# Patient Record
Sex: Male | Born: 1955 | Race: White | Hispanic: No | Marital: Married | Smoking: Never smoker
Health system: Southern US, Community
[De-identification: ages and names within clinical notes are randomized; demographics above are authoritative.]

## PROBLEM LIST (undated history)

## (undated) ENCOUNTER — Ambulatory Visit: Admission: EM | Payer: Commercial Managed Care - PPO | Source: Home / Self Care

## (undated) DIAGNOSIS — I10 Essential (primary) hypertension: Secondary | ICD-10-CM

## (undated) DIAGNOSIS — R931 Abnormal findings on diagnostic imaging of heart and coronary circulation: Secondary | ICD-10-CM

## (undated) DIAGNOSIS — Z860100 Personal history of colon polyps, unspecified: Secondary | ICD-10-CM

## (undated) DIAGNOSIS — T8859XA Other complications of anesthesia, initial encounter: Secondary | ICD-10-CM

## (undated) DIAGNOSIS — R7303 Prediabetes: Secondary | ICD-10-CM

## (undated) DIAGNOSIS — Z9889 Other specified postprocedural states: Secondary | ICD-10-CM

## (undated) DIAGNOSIS — R112 Nausea with vomiting, unspecified: Secondary | ICD-10-CM

## (undated) DIAGNOSIS — G4733 Obstructive sleep apnea (adult) (pediatric): Secondary | ICD-10-CM

## (undated) DIAGNOSIS — C801 Malignant (primary) neoplasm, unspecified: Secondary | ICD-10-CM

## (undated) DIAGNOSIS — R7301 Impaired fasting glucose: Secondary | ICD-10-CM

## (undated) DIAGNOSIS — E785 Hyperlipidemia, unspecified: Secondary | ICD-10-CM

## (undated) DIAGNOSIS — Z8719 Personal history of other diseases of the digestive system: Secondary | ICD-10-CM

## (undated) DIAGNOSIS — C61 Malignant neoplasm of prostate: Secondary | ICD-10-CM

## (undated) HISTORY — DX: Malignant neoplasm of prostate: C61

## (undated) HISTORY — DX: Essential (primary) hypertension: I10

## (undated) HISTORY — DX: Abnormal findings on diagnostic imaging of heart and coronary circulation: R93.1

## (undated) HISTORY — DX: Personal history of colon polyps, unspecified: Z86.0100

## (undated) HISTORY — DX: Obstructive sleep apnea (adult) (pediatric): G47.33

## (undated) HISTORY — DX: Personal history of other diseases of the digestive system: Z87.19

## (undated) HISTORY — PX: NASAL SEPTUM SURGERY: SHX37

## (undated) HISTORY — DX: Personal history of colon polyps: Z86.010

## (undated) HISTORY — PX: EYE SURGERY: SHX253

## (undated) HISTORY — DX: Impaired fasting glucose: R73.01

## (undated) HISTORY — PX: TONSILLECTOMY: SUR1361

## (undated) HISTORY — PX: OTHER SURGICAL HISTORY: SHX169

---

## 2002-01-11 ENCOUNTER — Ambulatory Visit (HOSPITAL_COMMUNITY): Admission: RE | Admit: 2002-01-11 | Discharge: 2002-01-11 | Payer: Self-pay | Admitting: Internal Medicine

## 2002-11-03 ENCOUNTER — Encounter: Payer: Self-pay | Admitting: Family Medicine

## 2002-11-03 ENCOUNTER — Ambulatory Visit (HOSPITAL_COMMUNITY): Admission: RE | Admit: 2002-11-03 | Discharge: 2002-11-03 | Payer: Self-pay | Admitting: Family Medicine

## 2006-10-05 ENCOUNTER — Ambulatory Visit (HOSPITAL_BASED_OUTPATIENT_CLINIC_OR_DEPARTMENT_OTHER): Admission: RE | Admit: 2006-10-05 | Discharge: 2006-10-05 | Payer: Self-pay | Admitting: Internal Medicine

## 2006-10-05 ENCOUNTER — Encounter: Payer: Self-pay | Admitting: Pulmonary Disease

## 2006-10-08 ENCOUNTER — Ambulatory Visit: Payer: Self-pay | Admitting: Internal Medicine

## 2007-03-27 ENCOUNTER — Telehealth: Payer: Self-pay | Admitting: Internal Medicine

## 2007-11-27 ENCOUNTER — Emergency Department (HOSPITAL_COMMUNITY): Admission: EM | Admit: 2007-11-27 | Discharge: 2007-11-27 | Payer: Self-pay | Admitting: Emergency Medicine

## 2008-09-09 DIAGNOSIS — I10 Essential (primary) hypertension: Secondary | ICD-10-CM | POA: Insufficient documentation

## 2008-09-09 DIAGNOSIS — E039 Hypothyroidism, unspecified: Secondary | ICD-10-CM | POA: Insufficient documentation

## 2008-09-09 DIAGNOSIS — E785 Hyperlipidemia, unspecified: Secondary | ICD-10-CM | POA: Insufficient documentation

## 2008-09-09 DIAGNOSIS — G4733 Obstructive sleep apnea (adult) (pediatric): Secondary | ICD-10-CM | POA: Insufficient documentation

## 2008-09-10 ENCOUNTER — Ambulatory Visit: Payer: Self-pay | Admitting: Pulmonary Disease

## 2008-09-24 ENCOUNTER — Encounter: Payer: Self-pay | Admitting: Pulmonary Disease

## 2008-09-25 DIAGNOSIS — G4700 Insomnia, unspecified: Secondary | ICD-10-CM | POA: Insufficient documentation

## 2009-01-21 ENCOUNTER — Ambulatory Visit (HOSPITAL_COMMUNITY): Admission: RE | Admit: 2009-01-21 | Discharge: 2009-01-21 | Payer: Self-pay | Admitting: Internal Medicine

## 2009-02-04 ENCOUNTER — Ambulatory Visit (HOSPITAL_COMMUNITY): Admission: RE | Admit: 2009-02-04 | Discharge: 2009-02-04 | Payer: Self-pay | Admitting: Otolaryngology

## 2009-04-13 ENCOUNTER — Encounter: Payer: Self-pay | Admitting: Pulmonary Disease

## 2009-10-01 ENCOUNTER — Ambulatory Visit: Payer: Self-pay | Admitting: Otolaryngology

## 2010-01-29 ENCOUNTER — Ambulatory Visit (HOSPITAL_BASED_OUTPATIENT_CLINIC_OR_DEPARTMENT_OTHER): Admission: RE | Admit: 2010-01-29 | Discharge: 2010-01-29 | Payer: Self-pay | Admitting: Ophthalmology

## 2010-02-17 ENCOUNTER — Ambulatory Visit: Payer: Self-pay | Admitting: Internal Medicine

## 2010-02-23 ENCOUNTER — Ambulatory Visit: Payer: Self-pay | Admitting: Internal Medicine

## 2010-03-09 ENCOUNTER — Telehealth (INDEPENDENT_AMBULATORY_CARE_PROVIDER_SITE_OTHER): Payer: Self-pay | Admitting: *Deleted

## 2010-03-16 ENCOUNTER — Encounter (INDEPENDENT_AMBULATORY_CARE_PROVIDER_SITE_OTHER): Payer: Self-pay | Admitting: *Deleted

## 2010-03-16 DIAGNOSIS — R1319 Other dysphagia: Secondary | ICD-10-CM

## 2010-04-08 ENCOUNTER — Ambulatory Visit (HOSPITAL_COMMUNITY)
Admission: RE | Admit: 2010-04-08 | Discharge: 2010-04-08 | Payer: Self-pay | Source: Home / Self Care | Attending: Gastroenterology | Admitting: Gastroenterology

## 2010-04-08 ENCOUNTER — Encounter: Payer: Self-pay | Admitting: Gastroenterology

## 2010-05-18 NOTE — Progress Notes (Signed)
Summary: Endo Colon  Phone Note Outgoing Call Call back at [phone]   Call placed by: Chales Abrahams CMA Duncan Dull),  March 09, 2010 1:10 PM Summary of Call: Dr Christella Hartigan called and asked me to schedule an Endo Colon on the pt possible ballon dil.  on  03/25/10 or 04/08/10.  Thurs at [employer].  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 Follow-up by: Chales Abrahams CMA Duncan Dull),  March 10, 2010 12:05 PM  Additional Follow-up for Phone Call Additional follow up Details #1::        Dr Jena Gauss returned call and would like to have endo colon on 04/08/10 830.  [phone] Additional Follow-up by: Chales Abrahams CMA Duncan Dull),  March 15, 2010 4:37 PM  New Problems: OTHER DYSPHAGIA (ICD-787.29) SPECIAL SCREENING FOR MALIGNANT NEOPLASMS COLON (ICD-V76.51)   Additional Follow-up for Phone Call Additional follow up Details #2::    booking per jill  [id number]  04/08/10  845 am  instructions mailed to the home, Follow-up by: Chales Abrahams CMA (AAMA),  March 16, 2010 8:00 AM  New Problems: OTHER DYSPHAGIA (ICD-787.29) SPECIAL SCREENING FOR MALIGNANT NEOPLASMS COLON (ICD-V76.51) New/Updated Medications: MOVIPREP 100 GM  SOLR (PEG-KCL-NACL-NASULF-NA ASC-C) As per prep instructions. Prescriptions: MOVIPREP 100 GM  SOLR (PEG-KCL-NACL-NASULF-NA ASC-C) As per prep instructions.  #1 x 0   Entered by:   Chales Abrahams CMA (AAMA)   Authorized by:   Rachael Fee MD   Signed by:   Chales Abrahams CMA (AAMA) on 03/17/2010   Method used:   Electronically to        [facility]* (retail)       [address]       [address] Shipping/mailing       [place], [place]  [address]       Ph: [phone]       Fax: [phone]   RxID:   [id number]

## 2010-05-18 NOTE — Letter (Signed)
Summary: [facility] Instructions  [facility]  [address] [place], [place] [address]   Phone: [phone]  Fax: [phone]       Jesus Ramos    11-08-1955    MRN: [id number]        Procedure Day /Date:04/08/10  Thurs     Arrival Time:645 am       Procedure Time: 845 am    Location of Procedure:                     X  [facility] ( Outpatient Registration)                        PREPARATION FOR COLONOSCOPY WITH MOVIPREP   Starting 5 days prior to your procedure 04/03/10 do not eat nuts, seeds, popcorn, corn, beans, peas,  salads, or any raw vegetables.  Do not take any fiber supplements (e.g. Metamucil, Citrucel, and Benefiber).  THE DAY BEFORE YOUR PROCEDURE         DATE: 04/07/10  DAY: WED  1.  Drink clear liquids the entire day-NO SOLID FOOD  2.  Do not drink anything colored red or purple.  Avoid juices with pulp.  No orange juice.  3.  Drink at least 64 oz. (8 glasses) of fluid/clear liquids during the day to prevent dehydration and help the prep work efficiently.  CLEAR LIQUIDS INCLUDE: Water Jello Ice Popsicles Tea (sugar ok, no milk/cream) Powdered fruit flavored drinks Coffee (sugar ok, no milk/cream) Gatorade Juice: apple, white grape, white cranberry  Lemonade Clear bullion, consomm, broth Carbonated beverages (any kind) Strained chicken noodle soup Hard Candy                             4.  In the morning, mix first dose of MoviPrep solution:    Empty 1 Pouch A and 1 Pouch B into the disposable container    Add lukewarm drinking water to the top line of the container. Mix to dissolve    Refrigerate (mixed solution should be used within 24 hrs)  5.  Begin drinking the prep at 5:00 p.m. The MoviPrep container is divided by 4 marks.   Every 15 minutes drink the solution down to the next mark (approximately 8 oz) until the full liter is complete.   6.  Follow completed prep with 16 oz of clear liquid of your choice  (Nothing red or purple).  Continue to drink clear liquids until bedtime.  7.  Before going to bed, mix second dose of MoviPrep solution:    Empty 1 Pouch A and 1 Pouch B into the disposable container    Add lukewarm drinking water to the top line of the container. Mix to dissolve    Refrigerate  THE DAY OF YOUR PROCEDURE      DATE: 04/08/10 DAY: THURS  Beginning at 345 a.m. (5 hours before procedure):         1. Every 15 minutes, drink the solution down to the next mark (approx 8 oz) until the full liter is complete.  Nothing to eat or drink after midnight  MEDICATION INSTRUCTIONS  Unless otherwise instructed, you should take regular prescription medications with a small sip of water   as early as possible the morning of your procedure.         OTHER INSTRUCTIONS  You will need a responsible adult at least 54  years of age to accompany you and drive you home.   This person must remain in the waiting room during your procedure.  Wear loose fitting clothing that is easily removed.  Leave jewelry and other valuables at home.  However, you may wish to bring a book to read or  an iPod/MP3 player to listen to music as you wait for your procedure to start.  Remove all body piercing jewelry and leave at home.  Total time from sign-in until discharge is approximately 2-3 hours.  You should go home directly after your procedure and rest.  You can resume normal activities the  day after your procedure.  The day of your procedure you should not:   Drive   Make legal decisions   Operate machinery   Drink alcohol   Return to work  You will receive specific instructions about eating, activities and medications before you leave.    The above instructions have been reviewed and explained to me by   Chales Abrahams CMA Duncan Dull)  March 16, 2010 8:06 AM     I fully understand and can verbalize these instructions over the phone mailed to home  Date 03/16/10

## 2010-05-18 NOTE — Letter (Signed)
Summary: [facility]  [facility]   Imported By: Sherian Rein 06/03/2009 12:10:53  _____________________________________________________________________  External Attachment:    Type:   Image     Comment:   External Document

## 2010-05-20 NOTE — Procedures (Signed)
Summary: Upper Endoscopy  Patient: Jesus Ramos Note: All result statuses are Final unless otherwise noted.  Tests: (1) Upper Endoscopy (EGD)   EGD Upper Endoscopy       DONE     [facility]     [address] [place], [place]  [address]           ENDOSCOPY PROCEDURE REPORT           PATIENT:  [name], [name]  MR#:  [id number]     BIRTHDATE:  [birth year], 54 yrs. old  GENDER:  male     ENDOSCOPIST:  Rachael Fee, MD     PROCEDURE DATE:  04/08/2010     PROCEDURE:  EGD, diagnostic 43235     ASA CLASS:  Class II     INDICATIONS:  chronic GERD; mild, non-progressive intermittent     dysphagia     MEDICATIONS:  MAC sedation, administered by CRNA     TOPICAL ANESTHETIC:  none           DESCRIPTION OF PROCEDURE:   After the risks benefits and     alternatives of the procedure were thoroughly explained, informed     consent was obtained.  The  endoscope was introduced through the     mouth and advanced to the second portion of the duodenum, without     limitations.  The instrument was slowly withdrawn as the mucosa     was fully examined.     <<PROCEDUREIMAGES>>     The upper, middle, and distal third of the esophagus were     carefully inspected and no abnormalities were noted. The z-line     was well seen at the GEJ. The endoscope was pushed into the fundus     which was normal including a retroflexed view. The antrum,gastric     body, first and second part of the duodenum were unremarkable (see     image1, image2, image3, image4, image6, and image7).     Retroflexed views revealed no abnormalities.    The scope was then     withdrawn from the patient and the procedure completed.     COMPLICATIONS:  None           ENDOSCOPIC IMPRESSION:     1) Normal EGD     2) No Barrett's, no esophagitis, no GE junction narrowings           RECOMMENDATIONS:     Observe clinically.     Continue PRN PPI.           ______________________________     Rachael Fee, MD       n.     eSIGNED:   Rachael Fee at 04/08/2010 09:48 AM           Eula Listen, [id number]  Note: An exclamation mark (!) indicates a result that was not dispersed into the flowsheet. Document Creation Date: 04/08/2010 9:48 AM _______________________________________________________________________  (1) Order result status: Final Collection or observation date-time: 04/08/2010 09:44 Requested date-time:  Receipt date-time:  Reported date-time:  Referring Physician:   Ordering Physician: Rob Bunting [phone]) Specimen Source:  Source: Launa Grill Order Number: [id number] Lab site:

## 2010-05-20 NOTE — Procedures (Signed)
Summary: Colonoscopy  Patient: Jesus Ramos Note: All result statuses are Final unless otherwise noted.  Tests: (1) Colonoscopy (COL)   COL Colonoscopy           DONE     [facility]     [address] [place], [place]  [address]           COLONOSCOPY PROCEDURE REPORT           PATIENT:  Jesus Ramos, Jesus Ramos  MR#:  [id number]     BIRTHDATE:  April 11, 1956, 54 yrs. old  GENDER:  male     ENDOSCOPIST:  Rachael Fee, MD     PROCEDURE DATE:  04/08/2010     PROCEDURE:  Colonoscopy with snare polypectomy     ASA CLASS:  Class II     INDICATIONS:  Routine Risk Screening, colonoscopy about 8 years     ago (diverticulosis, no polyps)     MEDICATIONS:   MAC sedation, administered by CRNA           DESCRIPTION OF PROCEDURE:   After the risks benefits and     alternatives of the procedure were thoroughly explained, informed     consent was obtained.  Digital rectal exam was performed and     revealed no rectal masses.   The EC-[id number] ([id number]) endoscope was     introduced through the anus and advanced to the cecum, which was     identified by both the appendix and ileocecal valve, without     limitations.  The quality of the prep was good, using MoviPrep.     The instrument was then slowly withdrawn as the colon was fully     examined.     <<PROCEDUREIMAGES>>     FINDINGS:  Three sessile polpys were found, all were removed and     all were sent to pathology. One was 6mm, located at hepatic     flexure, removed with cold snare and sent to path (jar 1). One was     9mm long, located in transverse colon, sent to path (jar 2). The     last was 1.7cm across, located in descending colon approximately     60cm from anal verge. Was slightly firm. Was cmopetely removed     with snare/cautery (not piecemeal) and sent to pathology. The site     was then labeled with [place] Ink (see image009, image010, image013,     image015, and image017).  Moderate diverticulosis was found in the  sigmoid to descending colon segments. The colonic mucosa in this     segment was slightly thickened, edematous (see image002).  This     was otherwise a normal examination of the colon (see image004,     image006, and image018).   Retroflexed views in the rectum     revealed no abnormalities.    The scope was then withdrawn from     the patient and the procedure completed.           COMPLICATIONS:  None           ENDOSCOPIC IMPRESSION:     1) Three polyps, all removed from colon.  The largest was 1.7cm,     located in descending colon, the site was labeled with [place] Ink     following what appeared to be complete snare removal.     2) Moderate diverticulosis in the sigmoid to descending colon     segments  3) Otherwise normal examination           RECOMMENDATIONS:     1) Await final pathology     2) I will call you with the pathology results when available           ______________________________     Rachael Fee, MD           n.     eSIGNED:   Rachael Fee at 04/08/2010 09:44 AM           Eula Listen, [id number]  Note: An exclamation mark (!) indicates a result that was not dispersed into the flowsheet. Document Creation Date: 04/08/2010 9:45 AM _______________________________________________________________________  (1) Order result status: Final Collection or observation date-time: 04/08/2010 09:32 Requested date-time:  Receipt date-time:  Reported date-time:  Referring Physician:   Ordering Physician: Rob Bunting [phone]) Specimen Source:  Source: Launa Grill Order Number: [id number] Lab site:   Appended Document: Colonoscopy patty, he needs colon recall in 6 months ([facility] with propofol).  Appended Document: Colonoscopy recall in IDX and EMR   Clinical Lists Changes  Observations: Added new observation of COLONNXTDUE: 09/2010 (04/14/2010 12:35)

## 2010-08-31 NOTE — Procedures (Signed)
NAME:  Jesus Ramos, Jesus Ramos NO.:  [id number]   MEDICAL RECORD NO.:  [id number]          PATIENT TYPE:  OUT   LOCATION:  SLEEP CENTER                 FACILITY:  [facility]   PHYSICIAN:  Clinton D. Maple Hudson, MD, FCCP, FACPDATE OF BIRTH:  1955/07/02   DATE OF STUDY:  10/05/2006                            NOCTURNAL POLYSOMNOGRAM   REFERRING PHYSICIAN:  Kingsley Callander. Ouida Sills, MD   INDICATION FOR STUDY:  Hypersomnia with sleep apnea.   EPWORTH SLEEPINESS SCORE:  4/24, BMI 37, weight 280 pounds.   MEDICATIONS:  Home medications are listed and reviewed.   SLEEP ARCHITECTURE:  Short total sleep time 263 minutes with sleep  efficiency 70%.  Stage I was 10%, stage II 80%, stages III and IV 2%,  REM 9% of total sleep time.  Sleep latency 75 minutes, REM latency 143  minutes, awake after sleep onset 41 minutes, arousal index 38.7.  No  bedtime medication was taken.   RESPIRATORY DATA:  Apnea-hypopnea index (AHI, RDI) 20.7 obstructive  events per hour, indicating moderate obstructive sleep apnea/hypopnea  syndrome.  There were 62 obstructive apneas and 29 hypopneas.  Events  were most common while supine but significant in all sleep positions.  REM AHI 38.3.  There were insufficient early events to permit CPAP  titration by split protocol on this study night.   OXYGEN DATA:  Very loud snoring with oxygen desaturation to a nadir of  83%.  Mean oxygen saturation through the study was 92% on room air.   CARDIAC DATA:  Normal sinus rhythm with occasional PAC.   MOVEMENT-PARASOMNIA:  Occasional limb jerk, insignificant.   IMPRESSIONS-RECOMMENDATIONS:  1. Moderate obstructive sleep apnea/hypopnea syndrome, apnea-hypopnea      index 20.7 per hour with nonpositional events, somewhat more common      while supine.  Very loud snoring with oxygen desaturation to a      nadir of 83%.  2. He had insufficient early events to permit CPAP titration on this      study night but would be a reasonable  candidate to return for CPAP      titration, or evaluate for alternative therapies as appropriate.      Clinton D. Maple Hudson, MD, [facility], FACP  Diplomate, Biomedical engineer of Sleep Medicine  Electronically Signed     CDY/MEDQ  D:  10/08/2006 12:12:48  T:  10/08/2006 16:41:41  Job:  [id number]

## 2010-08-31 NOTE — Assessment & Plan Note (Signed)
NAME:  Jesus Ramos, Jesus Ramos                 CHART#:  [id number]   DATE:  03/27/2007                       DOB:  01-11-1956   CHIEF COMPLAINT:  Cough/congestion.   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  He is requesting antitussive.  He denies  any fever, chills, nausea, abd pain or vomiting.  Denies any wheezing or  shortness of breath.   OBJECTIVE:  He is alert, oriented, pleasant, and cooperative, in no  acute distress.  Afebrile.  HEENT:  Pupils equal, round, and reactive to light.  Sclerae clear,  nonicteric.  Conjunctivae pink.  Oropharynx pink and moist without  lesions.  Posterior pharynx clear.  CHEST:  Heart regular rate and rhythm.  Normal S1, S2.  LUNGS:  Clear to auscultation bilaterally.   ASSESSMENT:  Upper respiratory infection/cough.   PLAN:  1. Will call in Tussionex 5 ml q.12h p.r.n. cough #60 mL, no refills.  2. He is to follow up with his primary care Arnecia Ector if he has any      further problems.       Lorenza Burton, N.P.  Electronically Signed     Kassie Mends, M.D.  Electronically Signed    KJ/MEDQ  D:  03/27/2007  T:  03/27/2007  Job:  [id number]

## 2010-09-07 NOTE — Op Note (Signed)
NAME:  Jesus Ramos, Jesus Ramos NO.:  [id number]  MEDICAL RECORD NO.:  [id number]          PATIENT TYPE:  AMB  LOCATION:  DSC                          FACILITY:  [facility]  PHYSICIAN:  Pasty Spillers. Maple Hudson, M.D. DATE OF BIRTH:  1955-12-19  DATE OF PROCEDURE:  01/29/2010 DATE OF DISCHARGE:  01/29/2010                              OPERATIVE REPORT   PREOPERATIVE DIAGNOSES: 1. Recurrent esotropia. 2. History of previous eye muscle surgery for esotropia, left eye,     details unknown. 3. Optic atrophy, left eye, cause unknown.  POSTOPERATIVE DIAGNOSES: 1. Recurrent esotropia. 2. History of previous eye muscle surgery for esotropia, left eye,     details unknown. 3. Optic atrophy, left eye, cause unknown.  PROCEDURES: 1. Left medial rectus muscle recession, 8.0 mm, adjustable technique. 2. Left lateral rectus muscle re-resection, 8.0 mm.  SURGEON:  Pasty Spillers. Glenisha Gundry, MD  ANESTHESIA:  General (laryngeal mask).  COMPLICATIONS:  None.  PROCEDURE:  After routine preoperative evaluation including informed consent, the patient was taken to the operating room where he was identified by me.  General anesthesia was induced without difficulty after placement of appropriate monitors.  The patient was prepped and draped in standard sterile fashion.  Lid speculum was placed in the left eye.  A limbal conjunctival peritomy of 2 clock hours extent was made nasally in the left eye with Westcott scissors, with relaxing incisions in the superonasal and inferonasal quadrants.  The left medial rectus muscle was found inserted at approximately the original location, 5 mm posterior to limbus.  The muscle was cleared of surrounding fascial attachments and scar tissue.  This tendon was secured with a double-arm 6-0 Vicryl suture, with a double-locking bite at each border of the muscle, 1 mm from the insertion.  The muscle was disinserted.  Each pole suture was passed back into the muscle stump  in crossed-swords fashion, and the muscle was drawn up to the level of the original insertion.  The pole sutures were joined with a needle driver at a measured distance of 8.0 mm above sclera, and a noose suture was tied around the pole sutures securely at this location.  The muscle was allowed to hang back until the noose knot reached sclera, creating an 8-mm hang back recession of the medial rectus muscle.  The superior border of the conjunctival flap was joined to adjacent conjunctiva with a 6-0 plain gut suture.  The inferior corner of the conjunctival flap was loosely joined to adjacent conjunctiva with a large loop of 6-0 plain gut, leaving the flap open to facilitate suture adjustment.  A limbal conjunctival peritomy was then made temporally, with relaxing incisions in the superotemporal, inferotemporal quadrants.  The left lateral rectus muscle was engaged on a series of muscle hooks and cleared of its surrounding fascial attachments and scar tissue.  It was found inserted approximately 7 mm posterior to the limbus.  The muscle was spread between two self- retaining hooks.  A 2-mm bite was taken of the center of muscle belly at a measured distance of 8.0 mm posterior to the insertion, and knot was tied securely at  this location.  The needle at each end of the double- arm suture was passed from the center of the muscle belly to the periphery, parallel to and 8.0 mm posterior to the insertion.  A resection clamp was placed in the muscle just anterior to these sutures. The muscle was disinserted.  Each pole suture was passed posteriorly to anteriorly through the corresponding end of the muscle stump, then anteriorly to posteriorly near the center of the stump, then posteriorly to anteriorly through the center of the muscle belly, just posterior to the previously placed knot.  The muscle was drawn up to the level of the original insertion, and all slack was removed before the suture  ends were tied securely.  The anterior 3 mm of the conjunctival flap was excised.  The flap was reapposed to the limbus with multiple 6-0 plain gut sutures, leaving the flap recessed approximately 2 mm posterior to the limbus.  A traction suture of 6-0 silk was placed at the nasal limbus.  The pole, noose, traction, and conjunctival sutures were taped to the left cheek.  TobraDex ointment was placed in the eye, followed by a sterile pad.  The patient was awakened without difficulty and taken to the recovery room in stable condition, having suffered no intraoperative or immediate postop complications.     Pasty Spillers. Maple Hudson, M.D.     Cheron Schaumann  D:  05/21/2010  T:  05/21/2010  Job:  [id number]  Electronically Signed by Verne Carrow M.D. on 09/07/2010 05:50:09 PM

## 2010-11-26 ENCOUNTER — Telehealth: Payer: Self-pay | Admitting: Gastroenterology

## 2010-11-26 NOTE — Telephone Encounter (Signed)
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  He would like to do this 4-6 weeks from now.  I'm sure we can find a time in that window that will work for him.  thanks

## 2010-11-29 NOTE — Telephone Encounter (Signed)
The available appointments are for 9/20, 9/27 and 10/11.  I will forward to Dr Jena Gauss.

## 2011-01-14 LAB — DIFFERENTIAL
Eosinophils Absolute: 0.3
Lymphs Abs: 2.5
Monocytes Relative: 10
Neutrophils Relative %: 60

## 2011-01-14 LAB — CBC
Hemoglobin: 14.6
MCHC: 33.5
Platelets: 277
RDW: 13.5

## 2011-01-14 LAB — COMPREHENSIVE METABOLIC PANEL
ALT: 28
Calcium: 9.5
GFR calc Af Amer: >60
Glucose, Bld: 127 — ABNORMAL HIGH
Sodium: 141
Total Protein: 7

## 2011-01-14 LAB — URINE CULTURE: Colony Count: NO GROWTH

## 2011-01-14 LAB — URINALYSIS, ROUTINE W REFLEX MICROSCOPIC
Ketones, ur: 15 — AB
Leukocytes, UA: NEGATIVE
Nitrite: NEGATIVE
Protein, ur: NEGATIVE

## 2011-02-16 DIAGNOSIS — H50112 Monocular exotropia, left eye: Secondary | ICD-10-CM | POA: Insufficient documentation

## 2011-04-28 ENCOUNTER — Telehealth: Payer: Self-pay

## 2011-04-28 ENCOUNTER — Other Ambulatory Visit: Payer: Self-pay

## 2011-04-28 DIAGNOSIS — Z1211 Encounter for screening for malignant neoplasm of colon: Secondary | ICD-10-CM

## 2011-04-28 MED ORDER — PEG-KCL-NACL-NASULF-NA ASC-C 100 G PO SOLR: 1.0000 | ORAL | Status: DC

## 2011-04-28 NOTE — Telephone Encounter (Signed)
Pharmacy information needed to fax in the Movi prep message to Dr Jena Gauss.

## 2011-04-28 NOTE — Telephone Encounter (Signed)
Kathlene November, Monday the 4th will work for me also. I'll see you then.  Nathanyal Ashmead, Can you put this on for 8:30 am, Monday feb 4th. Moderate sedation. Let's do the previsit education over the phone rather as well?  Thanks   ----- Message ----- From: Corbin Ade, MD Sent: 04/27/2011 2:34 PM To: Rob Bunting, MD, Chales Abrahams, CMA  Time has gotten away from me regarding my followup colonoscopy. I'd like to get it done.  Let me just throw out the date of Monday, February 4( in the morning) as one possibility on this end. I do not care whether it's with conscious sedation or with propofol (I'd just as soon have it done with the former rather than the latter). Either hospital or your center OK too.  Otherwise, let me know what is available on your schedule and I'll try and line up with with mine.  Thanks   Staff message sent to Dr Jena Gauss with information advised he can call me when he is available for instructions.

## 2011-05-23 ENCOUNTER — Ambulatory Visit (HOSPITAL_COMMUNITY)
Admission: RE | Admit: 2011-05-23 | Discharge: 2011-05-23 | Disposition: A | Discharge status: Home / Self Care | Payer: 59 | Source: Ambulatory Visit | Attending: Gastroenterology | Admitting: Gastroenterology

## 2011-05-23 ENCOUNTER — Encounter (HOSPITAL_COMMUNITY): Payer: Self-pay | Admitting: *Deleted

## 2011-05-23 ENCOUNTER — Encounter (HOSPITAL_COMMUNITY)
Admission: RE | Disposition: A | Discharge status: Home / Self Care | Payer: Self-pay | Source: Ambulatory Visit | Attending: Gastroenterology

## 2011-05-23 DIAGNOSIS — I10 Essential (primary) hypertension: Secondary | ICD-10-CM | POA: Insufficient documentation

## 2011-05-23 DIAGNOSIS — K5730 Diverticulosis of large intestine without perforation or abscess without bleeding: Secondary | ICD-10-CM | POA: Insufficient documentation

## 2011-05-23 DIAGNOSIS — Z860100 Personal history of colon polyps, unspecified: Secondary | ICD-10-CM | POA: Insufficient documentation

## 2011-05-23 DIAGNOSIS — E785 Hyperlipidemia, unspecified: Secondary | ICD-10-CM | POA: Insufficient documentation

## 2011-05-23 HISTORY — PX: COLONOSCOPY: SHX5424

## 2011-05-23 HISTORY — DX: Essential (primary) hypertension: I10

## 2011-05-23 HISTORY — DX: Hyperlipidemia, unspecified: E78.5

## 2011-05-23 HISTORY — DX: Other specified postprocedural states: Z98.890

## 2011-05-23 HISTORY — DX: Nausea with vomiting, unspecified: R11.2

## 2011-05-23 SURGERY — COLONOSCOPY
Anesthesia: Moderate Sedation

## 2011-05-23 MED ORDER — FENTANYL CITRATE 0.05 MG/ML IJ SOLN
INTRAMUSCULAR | Status: AC
  Filled 2011-05-23: qty 4

## 2011-05-23 MED ORDER — MIDAZOLAM HCL 10 MG/2ML IJ SOLN
INTRAMUSCULAR | Status: AC
  Filled 2011-05-23: qty 4

## 2011-05-23 MED ORDER — SODIUM CHLORIDE 0.9 % IV SOLN
Freq: Once | INTRAVENOUS | Status: AC
  Administered 2011-05-23: 500 mL via INTRAVENOUS

## 2011-05-23 NOTE — Progress Notes (Signed)
No sedation during colonoscopy patient alert no pain or discomfort,discharge instructions given

## 2011-05-23 NOTE — Op Note (Signed)
[facility] [address] [place], [place]  [address]  COLONOSCOPY PROCEDURE REPORT  PATIENT:  Jesus Ramos, Jesus Ramos  MR#:  [id number] BIRTHDATE:  Aug 04, 1955, 55 yrs. old  GENDER:  male ENDOSCOPIST:  Rachael Fee, MD REF. BY:  Carylon Perches, M.D. PROCEDURE DATE:  05/23/2011 PROCEDURE:  Colonoscopy 25956 ASA CLASS:  Class II  INDICATIONS:  Colonoscopy 03/2010:Three polyps, all removed from colon. The largest was 1.7cm, located in descending colon, the site was labeled with [place] Ink Biopsies showed TAs without HGD and one polyp was 'Acupuncturist.'  MEDICATIONS:   none  DESCRIPTION OF PROCEDURE:   After the risks benefits and alternatives of the procedure were thoroughly explained, informed consent was obtained.  Digital rectal exam was performed and revealed no rectal masses.   The Pentax Colonoscope [id number] endoscope was introduced through the anus and advanced to the cecum, which was identified by both the appendix and ileocecal valve, without limitations.  The quality of the prep was good.. The instrument was then slowly withdrawn as the colon was fully examined. FINDINGS:  Mild diverticulosis was found in the sigmoid to descending colon segments.  The site of previous polyectomy in descending colon was clearly noted, there was [place] Ink visible. There was no residual or rerrent adenoma at the site. This was otherwise a normal examination of the colon.   Retroflexed views in the rectum revealed no abnormalities.  COMPLICATIONS:  None  ENDOSCOPIC IMPRESSION: 1) Mild diverticulosis in the sigmoid to descending colon segments 2) Otherwise normal examination; site of previous polypectomy in descending colon was normal appearing, located with aid of previous [place] Ink injection 3) No polyps or cancers  RECOMMENDATIONS:  1) Give your personal history of adenomatous polyps, you will need repeat examination in 5 years.  My office will contact you at that  time.  ______________________________ Rachael Fee, MD  n. eSIGNED:   Rachael Fee at 05/23/2011 09:23 AM  Eula Listen, [phone]

## 2011-05-23 NOTE — H&P (Signed)
   HPI: This is a very pleasant man with history of adenomatous polyps, here for surveillance examination:   Colonoscopy 2011: 1) Three polyps, all removed from colon. The largest was 1.7cm,  located in descending colon, the site was labeled with [place] Ink  following what appeared to be complete snare removal.  2) Moderate diverticulosis in the sigmoid to descending colon  segments  3) Otherwise normal examination  Biopsies showed TAs without HGD and one polyp was 'vegetable matter.'   Past Medical History  Diagnosis Date  . PONV (postoperative nausea and vomiting)   . Hypertension   . Hyperlipidemia     Past Surgical History  Procedure Date  . Tonsillectomy   . Eye surgery   . Nasal septum surgery     No current facility-administered medications for this encounter.    Allergies as of 04/28/2011  . (Not on File)    History reviewed. No pertinent family history.  History   Social History  . Marital Status: Married    Spouse Name: N/A    Number of Children: N/A  . Years of Education: N/A   Occupational History  . Not on file.   Social History Main Topics  . Smoking status: Not on file  . Smokeless tobacco: Not on file  . Alcohol Use:   . Drug Use: No  . Sexually Active:    Other Topics Concern  . Not on file   Social History Narrative  . No narrative on file      Physical Exam: Constitutional: generally well-appearing Psychiatric: alert and oriented x3 Abdomen: soft, nontender, nondistended, no obvious ascites, no peritoneal signs, normal bowel sounds     Assessment and plan: 56 y.o. male with personal history of adenomatous polyps    colonsocopy now

## 2011-05-23 NOTE — Progress Notes (Signed)
Patient chose to have colonscopy without sedation. Complained of some pain with scope advancement , especially through sigmoid colon, but otherwise tolerated very well

## 2011-05-24 ENCOUNTER — Encounter (HOSPITAL_COMMUNITY): Payer: Self-pay | Admitting: Gastroenterology

## 2011-07-06 DIAGNOSIS — H53039 Strabismic amblyopia, unspecified eye: Secondary | ICD-10-CM | POA: Insufficient documentation

## 2011-10-24 DIAGNOSIS — H5022 Vertical strabismus, left eye: Secondary | ICD-10-CM | POA: Insufficient documentation

## 2011-12-28 DIAGNOSIS — H53421 Scotoma of blind spot area, right eye: Secondary | ICD-10-CM | POA: Insufficient documentation

## 2011-12-28 DIAGNOSIS — H269 Unspecified cataract: Secondary | ICD-10-CM | POA: Insufficient documentation

## 2012-05-24 DIAGNOSIS — H3589 Other specified retinal disorders: Secondary | ICD-10-CM | POA: Insufficient documentation

## 2014-08-04 ENCOUNTER — Encounter (HOSPITAL_COMMUNITY): Payer: Self-pay | Admitting: Emergency Medicine

## 2014-08-04 ENCOUNTER — Emergency Department (HOSPITAL_COMMUNITY)
Admission: EM | Admit: 2014-08-04 | Discharge: 2014-08-04 | Disposition: A | Discharge status: Home / Self Care | Payer: 59 | Attending: Emergency Medicine | Admitting: Emergency Medicine

## 2014-08-04 ENCOUNTER — Emergency Department (HOSPITAL_COMMUNITY): Payer: 59

## 2014-08-04 DIAGNOSIS — N201 Calculus of ureter: Secondary | ICD-10-CM | POA: Insufficient documentation

## 2014-08-04 DIAGNOSIS — K5731 Diverticulosis of large intestine without perforation or abscess with bleeding: Secondary | ICD-10-CM | POA: Insufficient documentation

## 2014-08-04 DIAGNOSIS — Z79899 Other long term (current) drug therapy: Secondary | ICD-10-CM | POA: Diagnosis not present

## 2014-08-04 DIAGNOSIS — E782 Mixed hyperlipidemia: Secondary | ICD-10-CM | POA: Diagnosis not present

## 2014-08-04 DIAGNOSIS — I10 Essential (primary) hypertension: Secondary | ICD-10-CM | POA: Diagnosis not present

## 2014-08-04 DIAGNOSIS — Z9889 Other specified postprocedural states: Secondary | ICD-10-CM | POA: Insufficient documentation

## 2014-08-04 DIAGNOSIS — R1030 Lower abdominal pain, unspecified: Secondary | ICD-10-CM | POA: Insufficient documentation

## 2014-08-04 DIAGNOSIS — K5790 Diverticulosis of intestine, part unspecified, without perforation or abscess without bleeding: Secondary | ICD-10-CM

## 2014-08-04 DIAGNOSIS — R109 Unspecified abdominal pain: Secondary | ICD-10-CM

## 2014-08-04 LAB — BASIC METABOLIC PANEL
Anion gap: 11 (ref 5–15)
BUN: 16 mg/dL (ref 6–23)
CHLORIDE: 108 mmol/L (ref 96–112)
CO2: 22 mmol/L (ref 19–32)
CREATININE: 1.05 mg/dL (ref 0.50–1.35)
Calcium: 9.1 mg/dL (ref 8.4–10.5)
GFR calc non Af Amer: 77 mL/min — ABNORMAL LOW (ref >90)
GFR, EST AFRICAN AMERICAN: 89 mL/min — AB (ref >90)
GLUCOSE: 137 mg/dL — AB (ref 70–99)
POTASSIUM: 4 mmol/L (ref 3.5–5.1)
Sodium: 141 mmol/L (ref 135–145)

## 2014-08-04 LAB — CBC WITH DIFFERENTIAL/PLATELET
Basophils Absolute: 0.1 10*3/uL (ref 0.0–0.1)
Basophils Relative: 1 % (ref 0–1)
EOS PCT: 3 % (ref 0–5)
Eosinophils Absolute: 0.4 10*3/uL (ref 0.0–0.7)
HEMATOCRIT: 46.5 % (ref 39.0–52.0)
Hemoglobin: 15.7 g/dL (ref 13.0–17.0)
LYMPHS ABS: 3.3 10*3/uL (ref 0.7–4.0)
LYMPHS PCT: 30 % (ref 12–46)
MCH: 29.4 pg (ref 26.0–34.0)
MCHC: 33.8 g/dL (ref 30.0–36.0)
MCV: 87.1 fL (ref 78.0–100.0)
MONO ABS: 0.9 10*3/uL (ref 0.1–1.0)
MONOS PCT: 8 % (ref 3–12)
Neutro Abs: 6.3 10*3/uL (ref 1.7–7.7)
Neutrophils Relative %: 58 % (ref 43–77)
Platelets: 307 10*3/uL (ref 150–400)
RBC: 5.34 MIL/uL (ref 4.22–5.81)
RDW: 14 % (ref 11.5–15.5)
WBC: 11 10*3/uL — AB (ref 4.0–10.5)

## 2014-08-04 LAB — URINALYSIS, ROUTINE W REFLEX MICROSCOPIC
Bilirubin Urine: NEGATIVE
GLUCOSE, UA: NEGATIVE mg/dL
Ketones, ur: NEGATIVE mg/dL
LEUKOCYTES UA: NEGATIVE
Nitrite: NEGATIVE
PH: 5 (ref 5.0–8.0)
Urobilinogen, UA: 0.2 mg/dL (ref 0.0–1.0)

## 2014-08-04 LAB — URINE MICROSCOPIC-ADD ON

## 2014-08-04 MED ORDER — ONDANSETRON HCL 4 MG/2ML IJ SOLN
4.0000 mg | Freq: Once | INTRAMUSCULAR | Status: AC
  Administered 2014-08-04: 4 mg via INTRAVENOUS
  Filled 2014-08-04: qty 2

## 2014-08-04 MED ORDER — KETOROLAC TROMETHAMINE 30 MG/ML IJ SOLN
30.0000 mg | Freq: Once | INTRAMUSCULAR | Status: AC
  Administered 2014-08-04: 30 mg via INTRAVENOUS
  Filled 2014-08-04: qty 1

## 2014-08-04 MED ORDER — HYDROMORPHONE HCL 1 MG/ML IJ SOLN
1.0000 mg | Freq: Once | INTRAMUSCULAR | Status: AC
  Administered 2014-08-04: 1 mg via INTRAVENOUS
  Filled 2014-08-04: qty 1

## 2014-08-04 MED ORDER — TAMSULOSIN HCL 0.4 MG PO CAPS: 0.4000 mg | ORAL_CAPSULE | Freq: Every day | ORAL | Status: DC

## 2014-08-04 MED ORDER — OXYCODONE-ACETAMINOPHEN 5-325 MG PO TABS: 1.0000 | ORAL_TABLET | ORAL | Status: DC | PRN

## 2014-08-04 MED ORDER — HYDROMORPHONE HCL 1 MG/ML IJ SOLN
1.0000 mg | Freq: Once | INTRAMUSCULAR | Status: AC
Start: 2014-08-04 — End: 2014-08-04
  Administered 2014-08-04: 1 mg via INTRAVENOUS
  Filled 2014-08-04: qty 1

## 2014-08-04 NOTE — ED Notes (Signed)
Patient states left sided flank pain that started 2 hours PTA. Patient states he has urinary urgency with little urine output and pain.

## 2014-08-04 NOTE — ED Provider Notes (Signed)
CSN: [id number]     Arrival date & time 08/04/14  0350 History   First MD Initiated Contact with Patient 08/04/14 [phone]     Chief Complaint  Patient presents with  . Flank Pain     Patient is a 59 y.o. male presenting with flank pain. The history is provided by the patient.  Flank Pain This is a new problem. The current episode started 1 to 2 hours ago. The problem occurs constantly. The problem has been rapidly worsening. Pertinent negatives include no chest pain, no abdominal pain and no shortness of breath. Nothing aggravates the symptoms. Nothing relieves the symptoms.  Patient reports left sided flank pain onset about 2 hrs ago He reports urinary frequency/urgency No cp/sob.  No fever but he does report nausea/vomiting He reports recent hematuria  He reports h/o kidney stones (did not require urologic procedure) and this feels similar to prior episodes.  Past Medical History  Diagnosis Date  . PONV (postoperative nausea and vomiting)   . Hypertension   . Hyperlipidemia    Past Surgical History  Procedure Laterality Date  . Tonsillectomy    . Eye surgery    . Nasal septum surgery    . Colonoscopy  05/23/2011    Procedure: COLONOSCOPY;  Surgeon: Owens Loffler, MD;  Location: [facility];  Service: Endoscopy;  Laterality: N/A;   History reviewed. No pertinent family history. History  Substance Use Topics  . Smoking status: Never Smoker   . Smokeless tobacco: Not on file  . Alcohol Use: No    Review of Systems  Constitutional: Positive for diaphoresis. Negative for fever.  Respiratory: Negative for shortness of breath.   Cardiovascular: Negative for chest pain.  Gastrointestinal: Positive for nausea and vomiting. Negative for abdominal pain.  Genitourinary: Positive for urgency, hematuria and flank pain.  Musculoskeletal: Negative for back pain.  Neurological: Negative for weakness.  All other systems reviewed and are negative.     Allergies  Review of patient's  allergies indicates no known allergies.  Home Medications   Prior to Admission medications   Medication Sig Start Date End Date Taking? Authorizing Provider  ramipril (ALTACE) 10 MG capsule Take 10 mg by mouth daily.    Historical Provider, MD  simvastatin (ZOCOR) 20 MG tablet Take 20 mg by mouth every evening.    Historical Provider, MD   BP 171/105 mmHg  Pulse 88  Temp(Src) 98.1 F (36.7 C) (Oral)  Resp 24  Ht 6' 1.5" (1.867 m)  Wt 180 lb (81.647 kg)  BMI 23.42 kg/m2  SpO2 100% Physical Exam CONSTITUTIONAL: Well developed, diaphoretic, ill appearing HEAD: Normocephalic EYES: EOMI ENMT: Mucous membranes moist NECK: supple no meningeal signs SPINE/BACK:entire spine nontender CV: S1/S2 noted, no murmurs/rubs/gallops noted LUNGS: Lungs are clear to auscultation bilaterally, no apparent distress ABDOMEN: soft, nontender, no rebound or guarding HW:EXHB cva tenderness NEURO: Pt is awake/alert/appropriate, moves all extremitiesx4.  No facial droop.   EXTREMITIES: pulses normal/equal, full ROM SKIN: warm, color normal PSYCH: awake/alert, no abnormalities of mood noted  ED Course  Procedures  4:33 AM Pt with acute onset of left flank pain, likely has ureteral colic Will start with dilaudid for pain control and reassess 5:17 AM Pt with continued flank pain, CT imaging ordered 5:52 AM Pt with some worsening of his pain and is requesting toradol 6:34 AM Pt feeling much improved He is trying PO challenge 7:18 AM Pt reports pain resolved He is ready for d/c home He will try flomax to assist stone  passage He will arrange urology followup BP 154/97 mmHg  Pulse 86  Temp(Src) 98.1 F (36.7 C) (Oral)  Resp 18  Ht 6' 1.5" (1.867 m)  Wt 280 lb (127.007 kg)  BMI 36.44 kg/m2  SpO2 97%  Labs Review Labs Reviewed  URINALYSIS, ROUTINE W REFLEX MICROSCOPIC - Abnormal; Notable for the following:    APPearance HAZY (*)    Specific Gravity, Urine >1.030 (*)    Hgb urine dipstick  LARGE (*)    Protein, ur TRACE (*)    All other components within normal limits  BASIC METABOLIC PANEL - Abnormal; Notable for the following:    Glucose, Bld 137 (*)    GFR calc non Af Amer 77 (*)    GFR calc Af Amer 89 (*)    All other components within normal limits  CBC WITH DIFFERENTIAL/PLATELET - Abnormal; Notable for the following:    WBC 11.0 (*)    All other components within normal limits  URINE MICROSCOPIC-ADD ON - Abnormal; Notable for the following:    Bacteria, UA MANY (*)    Casts GRANULAR CAST (*)    All other components within normal limits    Imaging Review Ct Renal Stone Study  08/04/2014   CLINICAL DATA:  Initial evaluation for acute left flank pain.  EXAM: CT ABDOMEN AND PELVIS WITHOUT CONTRAST  TECHNIQUE: Multidetector CT imaging of the abdomen and pelvis was performed following the standard protocol without IV contrast.  COMPARISON:  Prior study from 11/27/2007.  FINDINGS: The visualized lung bases are clear. No pleural or pericardial effusion. Focal pleural thickening noted within the inferior right middle lobe.  19 mm cyst noted within the right hepatic lobe. Liver is otherwise unremarkable. Gallbladder normal. No biliary dilatation. Spleen, adrenal glands, and pancreas demonstrate a normal unenhanced appearance.  Right kidney unremarkable without evidence for nephrolithiasis or hydronephrosis. Small focal nodular outpouching from the lateral margin of the interpolar right kidney is stable from prior. No radiopaque stones seen along the course of the right renal collecting system. There is no right-sided hydroureter.  On the left, there is an obstructive 3 mm stone at the left UVJ. There is secondary mild left hydroureteronephrosis. No other calculi seen within the left kidney or along the course of the left renal collecting system.  Stomach within normal limits. No evidence for bowel obstruction. Sigmoid diverticulosis present without acute diverticulitis. No acute  inflammatory changes seen about the bowels. Appendix is normal.  Bladder decompressed but grossly normal.  Prostate unremarkable.  No free air or fluid. No adenopathy. Mild atherosclerotic plaque present within the iliac arteries bilaterally.  No acute osseous abnormality. No worrisome lytic or blastic osseous lesion. Degenerative disc desiccation present at L4-5 and L5-S1.  IMPRESSION: 1. 3 mm obstructive stone at the left UVJ with secondary mild left hydroureteronephrosis. No other stones present within either kidney or along the course of either renal collecting system. 2. No other acute intra-abdominal or pelvic process. 3. Sigmoid diverticulosis without acute diverticulitis.   Electronically Signed   By: Jeannine Boga M.D.   On: 08/04/2014 06:24    Medications  HYDROmorphone (DILAUDID) injection 1 mg (1 mg Intravenous Given 08/04/14 0425)  ondansetron (ZOFRAN) injection 4 mg (4 mg Intravenous Given 08/04/14 0425)  HYDROmorphone (DILAUDID) injection 1 mg (1 mg Intravenous Given 08/04/14 0439)  HYDROmorphone (DILAUDID) injection 1 mg (1 mg Intravenous Given 08/04/14 0501)  ketorolac (TORADOL) 30 MG/ML injection 30 mg (30 mg Intravenous Given 08/04/14 0550)    MDM  Final diagnoses:  Flank pain  Left ureteral stone  Diverticulosis of intestine without bleeding, unspecified intestinal tract location    Nursing notes including past medical history and social history reviewed and considered in documentation Labs/vital reviewed myself and considered during evaluation     Ripley Fraise, MD 08/04/14 [phone]

## 2014-08-04 NOTE — ED Notes (Signed)
Patient reports left flank pain with vomiting that started approximately 2 hours ago. Also reports urgency and hesitancy with urination. States hematuria a couple days ago.

## 2014-08-05 LAB — URINE CULTURE
COLONY COUNT: NO GROWTH
CULTURE: NO GROWTH

## 2015-05-15 ENCOUNTER — Encounter (HOSPITAL_COMMUNITY): Payer: Self-pay

## 2015-05-15 ENCOUNTER — Emergency Department (HOSPITAL_COMMUNITY)
Admission: EM | Admit: 2015-05-15 | Discharge: 2015-05-16 | Disposition: A | Discharge status: Home / Self Care | Payer: 59 | Attending: Emergency Medicine | Admitting: Emergency Medicine

## 2015-05-15 ENCOUNTER — Emergency Department (HOSPITAL_COMMUNITY): Payer: 59

## 2015-05-15 DIAGNOSIS — I10 Essential (primary) hypertension: Secondary | ICD-10-CM | POA: Diagnosis not present

## 2015-05-15 DIAGNOSIS — L03114 Cellulitis of left upper limb: Secondary | ICD-10-CM

## 2015-05-15 DIAGNOSIS — M719 Bursopathy, unspecified: Secondary | ICD-10-CM

## 2015-05-15 DIAGNOSIS — E785 Hyperlipidemia, unspecified: Secondary | ICD-10-CM | POA: Diagnosis not present

## 2015-05-15 DIAGNOSIS — M25522 Pain in left elbow: Secondary | ICD-10-CM | POA: Diagnosis present

## 2015-05-15 DIAGNOSIS — M71522 Other bursitis, not elsewhere classified, left elbow: Secondary | ICD-10-CM | POA: Diagnosis not present

## 2015-05-15 DIAGNOSIS — Z79899 Other long term (current) drug therapy: Secondary | ICD-10-CM | POA: Insufficient documentation

## 2015-05-15 DIAGNOSIS — Z7982 Long term (current) use of aspirin: Secondary | ICD-10-CM | POA: Diagnosis not present

## 2015-05-15 MED ORDER — CEFAZOLIN SODIUM 1-5 GM-% IV SOLN
1.0000 g | Freq: Once | INTRAVENOUS | Status: AC
Start: 2015-05-15 — End: 2015-05-16
  Administered 2015-05-15: 1 g via INTRAVENOUS
  Filled 2015-05-15: qty 50

## 2015-05-15 MED ORDER — IBUPROFEN 400 MG PO TABS
600.0000 mg | ORAL_TABLET | Freq: Once | ORAL | Status: AC
  Administered 2015-05-15: 600 mg via ORAL
  Filled 2015-05-15: qty 2

## 2015-05-15 NOTE — ED Notes (Signed)
Left elbow showing some redness and swelling.  Warm and tender to touch.

## 2015-05-15 NOTE — ED Notes (Signed)
Pt c/o pain and swelling to his left elbow x 1 day, states it started swelling yesterday, denies trauma

## 2015-05-15 NOTE — ED Provider Notes (Signed)
CSN: [id number]     Arrival date & time 05/15/15  2302 History  By signing my name below, I, Meriel Pica, attest that this documentation has been prepared under the direction and in the presence of Dorie Rank, MD. Electronically Signed: Meriel Pica, ED Scribe. 05/15/2015. 11:40 PM.   Chief Complaint  Patient presents with  . Elbow Pain   The history is provided by the patient. No language interpreter was used.   HPI Comments: Jesus Ramos is a 60 y.o. male who presents to the Emergency Department complaining of sudden onset, progressively worsening, constant anterior left elbow pain and swelling X 1 day. The elbow is warm to the touch with some erythema noted. 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 He has no h/o gout, cellulitis, or infections. Pt denies fevers but notes chills onset this evening.  Past Medical History  Diagnosis Date  . PONV (postoperative nausea and vomiting)   . Hypertension   . Hyperlipidemia    Past Surgical History  Procedure Laterality Date  . Tonsillectomy    . Eye surgery    . Nasal septum surgery    . Colonoscopy  05/23/2011    Procedure: COLONOSCOPY;  Surgeon: Owens Loffler, MD;  Location: [facility];  Service: Endoscopy;  Laterality: N/A;   No family history on file. Social History  Substance Use Topics  . Smoking status: Never Smoker   . Smokeless tobacco: None  . Alcohol Use: No    Review of Systems  Constitutional: Positive for chills. Negative for fever.  Musculoskeletal: Positive for joint swelling ( left elbow) and arthralgias ( left elbow ).  Skin: Positive for color change ( erythema to anterior left elbow).  A complete 10 system review of systems was obtained and is otherwise negative except at noted in the HPI and PMH.  Allergies  Review of patient's allergies indicates no known allergies.  Home Medications   Prior to Admission medications   Medication Sig Start Date End Date Taking? Authorizing Provider   amLODipine (NORVASC) 5 MG tablet Take 5 mg by mouth daily.   Yes Historical Provider, MD  aspirin 81 MG tablet Take 162 mg by mouth daily.    Yes Historical Provider, MD  ramipril (ALTACE) 10 MG capsule Take 10 mg by mouth daily.   Yes Historical Provider, MD  simvastatin (ZOCOR) 20 MG tablet Take 20 mg by mouth every evening.   Yes Historical Provider, MD  doxycycline (VIBRAMYCIN) 100 MG capsule Take 1 capsule (100 mg total) by mouth 2 (two) times daily. 05/16/15   Dorie Rank, MD   BP 138/92 mmHg  Pulse 110  Temp(Src) 99.4 F (37.4 C) (Oral)  Resp 18  Ht 6\' 1"  (1.854 m)  Wt 275 lb (124.739 kg)  BMI 36.29 kg/m2  SpO2 94% Physical Exam  Constitutional: He appears well-developed and well-nourished. No distress.  HENT:  Head: Normocephalic and atraumatic.  Right Ear: External ear normal.  Left Ear: External ear normal.  Eyes: Conjunctivae are normal. Right eye exhibits no discharge. Left eye exhibits no discharge. No scleral icterus.  Neck: Neck supple. No tracheal deviation present.  Cardiovascular: Normal rate.   Pulmonary/Chest: Effort normal. No stridor. No respiratory distress.  Musculoskeletal: He exhibits no edema.       Left elbow: He exhibits swelling. He exhibits normal range of motion. Tenderness found.  Small scab over the olecranon process, erythema and swelling around the elbow joint, full passive and active ROM without difficulty, no pain with pronation, supination,  flexion and extension.  Neurological: He is alert. Cranial nerve deficit: no gross deficits.  Skin: Skin is warm and dry. No rash noted.  Psychiatric: He has a normal mood and affect.  Nursing note and vitals reviewed.   ED Course  Procedures   ULTRASOUND LIMITED SOFT TISSUE/ MUSCULOSKELETAL:  Left elbow Indication: infection Linear probe used to evaluate area of interest in two planes. Findings:  Soft tissue swelling, small < 0.25-0.5 cm area of fluid collection  Performed by: Dr Tomi Bamberger Images saved  electronically  DIAGNOSTIC STUDIES: Oxygen Saturation is 94% on RA, adequate by my interpretation.    COORDINATION OF CARE: 11:24 PM Discussed treatment plan which includes to order IV antibiotics, Xray of left elbow, diagnostic labs and to perform bed side [place] of left elbow with pt. Pt acknowledges and agrees to plan.   Labs Review Labs Reviewed  CBC WITH DIFFERENTIAL/PLATELET - Abnormal; Notable for the following:    WBC 12.1 (*)    Neutro Abs 8.3 (*)    Monocytes Absolute 1.1 (*)    All other components within normal limits  BASIC METABOLIC PANEL - Abnormal; Notable for the following:    Glucose, Bld 157 (*)    BUN 21 (*)    All other components within normal limits    Imaging Review Dg Elbow 2 Views Left  05/16/2015  CLINICAL DATA:  Acute onset of worsening left anterior elbow pain and swelling, with erythema. Chills. Initial encounter. EXAM: LEFT ELBOW - 2 VIEW COMPARISON:  None. FINDINGS: There is no evidence of fracture or dislocation. The visualized joint spaces are preserved. No significant joint effusion is identified. Diffuse soft tissue swelling is noted about the dorsum of the the distal upper arm and proximal forearm. IMPRESSION: No evidence of fracture or dislocation. Diffuse soft tissue swelling noted dorsally. Electronically Signed   By: Garald Balding M.D.   On: 05/16/2015 01:08   I have personally reviewed and evaluated these images and lab results as part of my medical decision-making.  MDM   Final diagnoses:  Cellulitis of left upper extremity  Bursitis    US performed.  No fluid collection amenable to I&D at this time.  Primarily soft tissue swelling. IV abx started.  No joint effusion on xray and ROM is full.  Doubt septic arthritis.  Will treat for cellulitis.  Monitor closely for developing abscess.  I personally performed the services described in this documentation, which was scribed in my presence.  The recorded information has been reviewed and is  accurate.     Dorie Rank, MD 05/16/15 [phone]

## 2015-05-16 DIAGNOSIS — Z7982 Long term (current) use of aspirin: Secondary | ICD-10-CM | POA: Diagnosis not present

## 2015-05-16 DIAGNOSIS — Z79899 Other long term (current) drug therapy: Secondary | ICD-10-CM | POA: Diagnosis not present

## 2015-05-16 DIAGNOSIS — M7989 Other specified soft tissue disorders: Secondary | ICD-10-CM | POA: Diagnosis not present

## 2015-05-16 DIAGNOSIS — M719 Bursopathy, unspecified: Secondary | ICD-10-CM | POA: Diagnosis not present

## 2015-05-16 DIAGNOSIS — E785 Hyperlipidemia, unspecified: Secondary | ICD-10-CM | POA: Diagnosis not present

## 2015-05-16 DIAGNOSIS — I10 Essential (primary) hypertension: Secondary | ICD-10-CM | POA: Diagnosis not present

## 2015-05-16 DIAGNOSIS — L03114 Cellulitis of left upper limb: Secondary | ICD-10-CM | POA: Diagnosis not present

## 2015-05-16 LAB — BASIC METABOLIC PANEL
Anion gap: 10 (ref 5–15)
BUN: 21 mg/dL — AB (ref 6–20)
CO2: 22 mmol/L (ref 22–32)
CREATININE: 0.95 mg/dL (ref 0.61–1.24)
Calcium: 8.9 mg/dL (ref 8.9–10.3)
Chloride: 106 mmol/L (ref 101–111)
Glucose, Bld: 157 mg/dL — ABNORMAL HIGH (ref 65–99)
Potassium: 3.7 mmol/L (ref 3.5–5.1)
SODIUM: 138 mmol/L (ref 135–145)

## 2015-05-16 LAB — CBC WITH DIFFERENTIAL/PLATELET
BASOS PCT: 0 %
Basophils Absolute: 0 10*3/uL (ref 0.0–0.1)
EOS ABS: 0.3 10*3/uL (ref 0.0–0.7)
EOS PCT: 2 %
HCT: 41.7 % (ref 39.0–52.0)
HEMOGLOBIN: 14 g/dL (ref 13.0–17.0)
LYMPHS ABS: 2.4 10*3/uL (ref 0.7–4.0)
Lymphocytes Relative: 20 %
MCH: 29.5 pg (ref 26.0–34.0)
MCHC: 33.6 g/dL (ref 30.0–36.0)
MCV: 87.8 fL (ref 78.0–100.0)
MONOS PCT: 9 %
Monocytes Absolute: 1.1 10*3/uL — ABNORMAL HIGH (ref 0.1–1.0)
NEUTROS PCT: 69 %
Neutro Abs: 8.3 10*3/uL — ABNORMAL HIGH (ref 1.7–7.7)
PLATELETS: 262 10*3/uL (ref 150–400)
RBC: 4.75 MIL/uL (ref 4.22–5.81)
RDW: 13.6 % (ref 11.5–15.5)
WBC: 12.1 10*3/uL — AB (ref 4.0–10.5)

## 2015-05-16 MED ORDER — DOXYCYCLINE HYCLATE 100 MG PO CAPS: 100.0000 mg | ORAL_CAPSULE | Freq: Two times a day (BID) | ORAL | Status: DC

## 2015-05-16 NOTE — Discharge Instructions (Signed)

## 2015-05-27 ENCOUNTER — Ambulatory Visit (INDEPENDENT_AMBULATORY_CARE_PROVIDER_SITE_OTHER): Payer: 59 | Admitting: Orthopaedic Surgery

## 2015-05-27 ENCOUNTER — Encounter: Payer: Self-pay | Admitting: Orthopaedic Surgery

## 2015-05-27 VITALS — BP 123/73 | HR 78 | Ht 74.0 in | Wt 301.0 lb

## 2015-05-27 DIAGNOSIS — M7022 Olecranon bursitis, left elbow: Secondary | ICD-10-CM

## 2015-05-27 DIAGNOSIS — M25422 Effusion, left elbow: Secondary | ICD-10-CM | POA: Diagnosis not present

## 2015-05-27 DIAGNOSIS — L03114 Cellulitis of left upper limb: Secondary | ICD-10-CM

## 2015-05-27 DIAGNOSIS — M25522 Pain in left elbow: Secondary | ICD-10-CM

## 2015-05-27 MED ORDER — DOXYCYCLINE HYCLATE 100 MG PO CAPS
100.0000 mg | ORAL_CAPSULE | Freq: Two times a day (BID) | ORAL | Status: DC
Start: 2015-05-27 — End: 2016-03-14

## 2015-05-27 MED ORDER — DOXYCYCLINE HYCLATE 100 MG PO CAPS: 100.0000 mg | ORAL_CAPSULE | Freq: Two times a day (BID) | ORAL | Status: DC

## 2015-05-28 DIAGNOSIS — L03114 Cellulitis of left upper limb: Secondary | ICD-10-CM | POA: Insufficient documentation

## 2015-05-28 DIAGNOSIS — M25529 Pain in unspecified elbow: Secondary | ICD-10-CM

## 2015-05-28 DIAGNOSIS — M25429 Effusion, unspecified elbow: Secondary | ICD-10-CM | POA: Insufficient documentation

## 2015-05-28 DIAGNOSIS — M7022 Olecranon bursitis, left elbow: Secondary | ICD-10-CM | POA: Insufficient documentation

## 2015-05-28 NOTE — Patient Instructions (Signed)
Take antibiotics as directed  Await culture and sensitivity report  Call if any problem or if the elbow gets worse.

## 2015-05-28 NOTE — Progress Notes (Signed)
Patient [id number] Jesus Ramos, male DOB:[birth year], 60 y.o. [id number]  Chief Complaint  Patient presents with  . 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  The elbow became red, warm, tender and swollen. H he a dull ache that was constant.  He had a slight fever.  He went to the ER and was seen.  He was given IV Ancef in the ER which significantly improved the redness of the elbow and his pain was also improved.  He was given Doxycycline 100 po bid which he has taken.  His left elbow has improved, there is a residual small area of redness.  It is not painful but it is tender.  He has some swelling of the olecranon area that is "squishy" in feeling it.  He is concerned that there may be some residual infection or cellulitis remaining in the elbow area.  He has no fever or chills now.  He has full motion of the elbow.  He has no trauma, no discharge.  He had not prior trauma to the elbow and does not know why it got red.  HPI  Body mass index is 38.63 kg/(m^2).  Review of Systems  Patient does not have Diabetes Mellitus. Patient does have hypertension. Patient does not have COPD or shortness of breath. Patient has BMI > 35. Patient does not have current smoking history.  Review of Systems  Past Medical History  Diagnosis Date  . PONV (postoperative nausea and vomiting)   . Hypertension   . Hyperlipidemia     Past Surgical History  Procedure Laterality Date  . Tonsillectomy    . Eye surgery    . Nasal septum surgery    . Colonoscopy  05/23/2011    Procedure: COLONOSCOPY;  Surgeon: Owens Loffler, MD;  Location: [facility];  Service: Endoscopy;  Laterality: N/A;    History reviewed. No pertinent family history.  Social History Social History  Substance Use Topics  . Smoking status: Never Smoker   . Smokeless tobacco: None  . Alcohol Use: No    No Known  Allergies  Current Outpatient Prescriptions  Medication Sig Dispense Refill  . amLODipine (NORVASC) 5 MG tablet Take 5 mg by mouth daily.    Marland Kitchen aspirin 81 MG tablet Take 162 mg by mouth daily.     . ramipril (ALTACE) 10 MG capsule Take 10 mg by mouth daily.    . simvastatin (ZOCOR) 20 MG tablet Take 20 mg by mouth every evening.    Marland Kitchen doxycycline (VIBRAMYCIN) 100 MG capsule Take 1 capsule (100 mg total) by mouth 2 (two) times daily. 20 capsule 0   No current facility-administered medications for this visit.     Physical Exam  Blood pressure 123/73, pulse 78, height 6\' 2"  (1.88 m), weight 301 lb (136.533 kg).  Constitutional: overall normal hygiene, normal nutrition, well developed, normal grooming, normal body habitus. Assistive device:none  Musculoskeletal: gait and station Limp none, muscle tone and strength are normal, no tremors or atrophy is present.  .  Neurological: coordination overall normal.  Deep tendon reflex/nerve stretch intact.  Sensation normal.  Cranial nerves II-XII intact.   Skin:normal  overall no scars, lesions, ulcers or rash but the left elbow has slight redness in the center of the olecranon area  No psoriasis.  Psychiatric: Alert and oriented x 3.  Recent memory intact, remote  memory unclear.  Normal mood and affect. Well groomed.  Good eye contact.  Cardiovascular: overall no swelling, no varicosities, no edema bilaterally, normal temperatures of the legs and arms, no clubbing, cyanosis and good capillary refill.  Lymphatic: palpation is normal.   Extremities:left elbow has slight redness of the central part of the olecranon area with some olecranon bursa swelling.  No drainage is present. Inspection olecranon bursa swelling Strength and tone normal Range of motion full of the left elbow without pain  Additional services performed: I prepped the left elbow.  The elbow had ethyl chloride applied as a topical spray and the left olecranon bursa was  aspirated with a 18 gauge needle of 9 1/2 cc of blood tinged fluid.  It did not appear purulent.  It was sent for culture and sensitivity.  He tolerated the procedure well.  PLAN Call if any problems.  Precautions discussed.  I will call in another ten days of doxycycline 100 mgm po bid.  Do not take with milk.  I await the results of the culture report.  Return to clinic to call.

## 2015-06-03 ENCOUNTER — Ambulatory Visit (INDEPENDENT_AMBULATORY_CARE_PROVIDER_SITE_OTHER): Payer: 59 | Admitting: Orthopaedic Surgery

## 2015-06-03 DIAGNOSIS — M7022 Olecranon bursitis, left elbow: Secondary | ICD-10-CM

## 2015-06-03 NOTE — Patient Instructions (Signed)
Call if any problem 

## 2015-06-03 NOTE — Progress Notes (Signed)
CC: more swelling of the left olecranon bursa but no redness.  He has swelling of the left olecranon bursa that is recurrent.  It was aspirated a week ago today.  He had slow recurrence of swelling over a few days.  He has been taking a second course of doxycycline.  He had slight redness but that is gone now.  He has no pain but has the squishy feeling when putting pressure over the bursa area.  He has full motion and no new injury.  Impression:  Left olecranon bursitis, becoming chronic.  No infection at the present time.  His culture and sensitivity swab that was prepared last visit was never picked up by the lab and has been discarded.  He is aware of this.  I talked with him about possible aspiration of the bursa today.  He has more swelling than he had last week but it is not red and does not hurt.  I think the fluid will most likely return.  I talked to him about just letting the elbow be as is and see how it does.  If it does not bother him other than having swelling, then let it be.  If it gets larger or painful or just a bother, then the elbow could be aspirated once again.  If it keeps returning, then need to consider excision.  If he gets another infection of the bursa, then after the infection is resolved, he needs to seriously consider excision of the bursa.  I will see him as needed.  He will let it be for now and observe it.

## 2015-08-31 ENCOUNTER — Telehealth: Payer: 59 | Admitting: Family

## 2015-08-31 DIAGNOSIS — H60393 Other infective otitis externa, bilateral: Secondary | ICD-10-CM

## 2015-08-31 MED ORDER — NEOMYCIN-POLYMYXIN-HC 3.5-10000-1 OT SOLN: 4.0000 [drp] | Freq: Four times a day (QID) | OTIC | Status: DC

## 2015-08-31 NOTE — Progress Notes (Signed)
Erroneous encounter

## 2015-08-31 NOTE — Progress Notes (Signed)
E Visit for Swimmer's Ear  We are sorry that you are not feeling well. Here is how we plan to help!  Based on what you have shared with me it looks like you have swimmers ear. Swimmer's ear is a redness or swelling, irritation, or infection of your outer ear canal.  These symptoms usually occur within a few days of swimming.  Your ear canal is a tube that goes from the opening of the ear to the eardrum.  When water stays in your ear canal, germs can grow.  This is a painful condition that often happens to children and swimmers of all ages.  It is not contagious and oral antibiotics are not required to treat uncomplicated swimmer's ear.  The usual symptoms include: Itching inside the ear, Redness or a sense of swelling in the ear, Pain when the ear is tugged on when pressure is placed on the ear, Pus draining from the infected ear. and I have prescribed: Neomycin 0.35%, polymyxin B 10,000 units/mL, and hydrocortisone 0,5% otic solution 4 drops in affected ears four times a day until completed    In certain cases swimmer's ear may progress to a more serious bacterial infection of the middle or inner ear.  If you have a fever 102 and up and significantly worsening symptoms, this could indicate a more serious infection moving to the middle/inner and needs face to face evaluation in an office by a provider.  Your symptoms should improve over the next 3 days and should resolve in about 7 days.  HOME CARE:   Wash your hands frequently.  Do not place the tip of the bottle on your ear or touch it with your fingers.  You can take Acetominophen 650 mg every 4-6 hours as needed for pain.  If pain is severe or moderate, you can apply a heating pad (set on low) or hot water bottle (wrapped in a towel) to outer ear for 20 minutes.  This will also increase drainage.  Avoid ear plugs  Do not use Q-tips  After showers, help the water run out by tilting your head to one side.  GET HELP RIGHT AWAY  IF:   Fever is over 102.2 degrees.  You develop progressive ear pain or hearing loss.  Ear symptoms persist longer than 3 days after treatment.  MAKE SURE YOU:   Understand these instructions.  Will watch your condition.  Will get help right away if you are not doing well or get worse.  TO PREVENT SWIMMER'S EAR:  Use a bathing cap or custom fitted swim molds to keep your ears dry.  Towel off after swimming to dry your ears.  Tilt your head or pull your earlobes to allow the water to escape your ear canal.  If there is still water in your ears, consider using a hairdryer on the lowest setting.  Thank you for choosing an e-visit. Your e-visit answers were reviewed by a board certified advanced clinical practitioner to complete your personal care plan. Depending upon the condition, your plan could have included both over the counter or prescription medications. Please review your pharmacy choice. Be sure that the pharmacy you have chosen is open so that you can pick up your prescription now.  If there is a problem you may message your provider in MyChart to have the prescription routed to another pharmacy. Your safety is important to us. If you have drug allergies check your prescription carefully.  For the next 24 hours, you can use   MyChart to ask questions about today's visit, request a non-urgent call back, or ask for a work or school excuse from your e-visit provider. You will get an email in the next two days asking about your experience. I hope that your e-visit has been valuable and will speed your recovery.       

## 2016-02-04 DIAGNOSIS — L218 Other seborrheic dermatitis: Secondary | ICD-10-CM | POA: Diagnosis not present

## 2016-02-04 DIAGNOSIS — Z1283 Encounter for screening for malignant neoplasm of skin: Secondary | ICD-10-CM | POA: Diagnosis not present

## 2016-02-04 DIAGNOSIS — L400 Psoriasis vulgaris: Secondary | ICD-10-CM | POA: Diagnosis not present

## 2016-02-04 DIAGNOSIS — B351 Tinea unguium: Secondary | ICD-10-CM | POA: Diagnosis not present

## 2016-02-26 ENCOUNTER — Telehealth: Payer: Self-pay

## 2016-02-26 DIAGNOSIS — Z1211 Encounter for screening for malignant neoplasm of colon: Secondary | ICD-10-CM

## 2016-02-26 NOTE — Telephone Encounter (Signed)
Good question. Let me check in the office. I think I'll have samples. If I show up at 9 AM, will that be okay?

## 2016-02-26 NOTE — Telephone Encounter (Signed)
Noted  

## 2016-02-26 NOTE — Telephone Encounter (Signed)
Yes, that would be fine.  Do you need me to send in a prep or do you have samples?

## 2016-02-26 NOTE — Telephone Encounter (Signed)
Hey Patty,  Could you squeeze me in at 10 AM on Monday, November 27th?.   I could get there by 9 AM for the procedure at 10.

## 2016-02-26 NOTE — Telephone Encounter (Signed)
What would be my options?

## 2016-02-26 NOTE — Telephone Encounter (Signed)
Got it. Thanks for all your help. I'll see  Dr. Ardis Hughs on the 27th.

## 2016-02-26 NOTE — Telephone Encounter (Signed)
-----   Message from Milus Banister, MD sent at 02/26/2016  7:54 AM EST ----- Regarding: RE: colonoscopy Ronalee Belts, I think we can work with these.  Reuben Knoblock, Can you talk with Lanelle Bal about adding colonoscopy for Dr. Gala Romney on Monday Nov 27th in [facility].  I have 6 cases in the PM but one is a double.  This is for polyp surveillance.  Thanks  DJ   ----- Message ----- From: Daneil Dolin, MD Sent: 02/25/2016   9:22 PM To: Milus Banister, MD Subject: colonoscopy                                    Melissa Montane:  How about Monday, November 27th or Tuesday, December 19th (I'd have a preference for November 27th).  If these dates don't work, I can come up with some other times.  Thanks.  Ronalee Belts ----- Message ----- From: Milus Banister, MD Sent: 02/18/2016   8:55 AM To: Daneil Dolin, MD  Sounds good, thanks  ----- Message ----- From: Daneil Dolin, MD Sent: 02/18/2016   8:47 AM To: Milus Banister, MD  Let me review my schedule and send some potential dates back to you by the first of the week.  Thanks.  Ronalee Belts ----- Message ----- From: Milus Banister, MD Sent: 02/18/2016   8:30 AM To: Barron Alvine, RN, Daneil Dolin, MD  Ronalee Belts, Any particular days look good for you?  DJ  ----- Message ----- From: Daneil Dolin, MD Sent: 02/16/2016   3:11 PM To: Milus Banister, MD  Melissa Montane:  I'm thinking about getting my surveillance TCS at your center before December 31 to be cost effective.  What do you think?  Ronalee Belts

## 2016-02-26 NOTE — Telephone Encounter (Signed)
Yes, 9 am is fine.  Dr Ardis Hughs has been using suprep for his colonoscopy's.  If you need instructions or a prep sent let me know I can send a prescription and put in the instructions in your chart.

## 2016-02-26 NOTE — Telephone Encounter (Signed)
Dr Gala Romney, 03/14/16 is fine! What time do you prefer?

## 2016-02-26 NOTE — Telephone Encounter (Signed)
He is doing procedures all day that so whenever is good for you.Marland Kitchen  He starts at 730 am.

## 2016-03-14 ENCOUNTER — Ambulatory Visit (AMBULATORY_SURGERY_CENTER): Payer: 59 | Admitting: Gastroenterology

## 2016-03-14 ENCOUNTER — Encounter: Payer: Self-pay | Admitting: Gastroenterology

## 2016-03-14 VITALS — BP 132/82 | HR 61 | Temp 97.1°F | Resp 11 | Ht 73.0 in | Wt 301.0 lb

## 2016-03-14 DIAGNOSIS — K5730 Diverticulosis of large intestine without perforation or abscess without bleeding: Secondary | ICD-10-CM | POA: Diagnosis not present

## 2016-03-14 DIAGNOSIS — D125 Benign neoplasm of sigmoid colon: Secondary | ICD-10-CM

## 2016-03-14 DIAGNOSIS — K219 Gastro-esophageal reflux disease without esophagitis: Secondary | ICD-10-CM | POA: Diagnosis not present

## 2016-03-14 DIAGNOSIS — Z86010 Personal history of colonic polyps: Secondary | ICD-10-CM

## 2016-03-14 DIAGNOSIS — Z1211 Encounter for screening for malignant neoplasm of colon: Secondary | ICD-10-CM

## 2016-03-14 DIAGNOSIS — I10 Essential (primary) hypertension: Secondary | ICD-10-CM | POA: Diagnosis not present

## 2016-03-14 DIAGNOSIS — K635 Polyp of colon: Secondary | ICD-10-CM | POA: Diagnosis not present

## 2016-03-14 MED ORDER — SODIUM CHLORIDE 0.9 % IV SOLN: 500.0000 mL | INTRAVENOUS | Status: DC

## 2016-03-14 NOTE — Op Note (Signed)
[facility] Patient Name: Jesus Ramos Procedure Date: 03/14/2016 9:17 AM MRN: [id number] Endoscopist: Milus Banister , MD Age: 60 Referring MD:  Date of Birth: 1956-04-01 Gender: Male Account #: [id number] Procedure:                Colonoscopy Indications:              High risk colon cancer surveillance: Personal                            history of colonic polyps (Colonoscopy                            03/2010:Three polyps, all removed from colon. The                            largest was 1.7cm, located in descending colon, the                            site was labeled with [place] Biopsies showed TAs                            without HGD and one polyp was 'vegetable matter.'                            Colonoscopy 2013 found no recurrent poylps. Medicines:                Monitored Anesthesia Care Procedure:                Pre-Anesthesia Assessment:                           - Prior to the procedure, a History and Physical                            was performed, and patient medications and                            allergies were reviewed. The patient's tolerance of                            previous anesthesia was also reviewed. The risks                            and benefits of the procedure and the sedation                            options and risks were discussed with the patient.                            All questions were answered, and informed consent                            was obtained. Prior Anticoagulants: The patient has  taken no previous anticoagulant or antiplatelet                            agents. ASA Grade Assessment: II - A patient with                            mild systemic disease. After reviewing the risks                            and benefits, the patient was deemed in                            satisfactory condition to undergo the procedure.                           After obtaining informed  consent, the colonoscope                            was passed under direct vision. Throughout the                            procedure, the patient's blood pressure, pulse, and                            oxygen saturations were monitored continuously. The                            Model CF-HQ190L [id number]) scope was introduced                            through the anus and advanced to the the cecum,                            identified by appendiceal orifice and ileocecal                            valve. The colonoscopy was performed without                            difficulty. The patient tolerated the procedure                            well. The quality of the bowel preparation was                            good. The ileocecal valve, appendiceal orifice, and                            rectum were photographed. Scope In: 9:21:34 AM Scope Out: 9:33:58 AM Scope Withdrawal Time: 0 hours 9 minutes 40 seconds  Total Procedure Duration: 0 hours 12 minutes 24 seconds  Findings:                 A 3 mm polyp was found in the sigmoid colon. The  polyp was sessile. The polyp was removed with a                            cold snare. Resection and retrieval were complete.                           Multiple small and large-mouthed diverticula were                            found in the left colon. Erythema, mucosal edema                            and some scattered petechia were visualized in                            association with the diverticulosis                           The exam was otherwise without abnormality on                            direct and retroflexion views. Complications:            No immediate complications. Estimated blood loss:                            None. Estimated Blood Loss:     Estimated blood loss: none. Impression:               - One 3 mm polyp in the sigmoid colon, removed with                            a cold snare.  Resected and retrieved.                           - Diverticulosis in the left colon. Erythema,                            mucosal edema and some scattered petechia were                            present in this segment. No acute inflammation,                            infection.                           - The examination was otherwise normal on direct                            and retroflexion views. Recommendation:           - Patient has a contact number available for                            emergencies. The signs and symptoms  of potential                            delayed complications were discussed with the                            patient. Return to normal activities tomorrow.                            Written discharge instructions were provided to the                            patient.                           - Resume previous diet.                           - Continue present medications.                           You will receive a letter within 2-3 weeks with the                            pathology results and my final recommendations.                           If the polyp(s) is proven to be 'pre-cancerous' on                            pathology, you will need repeat colonoscopy in 5                            years. Milus Banister, MD 03/14/2016 9:40:48 AM This report has been signed electronically.

## 2016-03-14 NOTE — Progress Notes (Signed)
Called to room to assist during endoscopic procedure.  Patient ID and intended procedure confirmed with present staff. Received instructions for my participation in the procedure from the performing physician.  

## 2016-03-14 NOTE — Patient Instructions (Signed)
YOU HAD AN ENDOSCOPIC PROCEDURE TODAY AT [facility]:   Refer to the procedure report that was given to you for any specific questions about what was found during the examination.  If the procedure report does not answer your questions, please call your gastroenterologist to clarify.  If you requested that your care partner not be given the details of your procedure findings, then the procedure report has been included in a sealed envelope for you to review at your convenience later.  YOU SHOULD EXPECT: Some feelings of bloating in the abdomen. Passage of more gas than usual.  Walking can help get rid of the air that was put into your GI tract during the procedure and reduce the bloating. If you had a lower endoscopy (such as a colonoscopy or flexible sigmoidoscopy) you may notice spotting of blood in your stool or on the toilet paper. If you underwent a bowel prep for your procedure, you may not have a normal bowel movement for a few days.  Please Note:  You might notice some irritation and congestion in your nose or some drainage.  This is from the oxygen used during your procedure.  There is no need for concern and it should clear up in a day or so.  SYMPTOMS TO REPORT IMMEDIATELY:   Following lower endoscopy (colonoscopy or flexible sigmoidoscopy):  Excessive amounts of blood in the stool  Significant tenderness or worsening of abdominal pains  Swelling of the abdomen that is new, acute  Fever of 100F or higher For urgent or emergent issues, a gastroenterologist can be reached at any hour by calling [phone].  DIET:  We do recommend a small meal at first, but then you may proceed to your regular diet.  Drink plenty of fluids but you should avoid alcoholic beverages for 24 hours.  ACTIVITY:  You should plan to take it easy for the rest of today and you should NOT DRIVE or use heavy machinery until tomorrow (because of the sedation medicines used during the test).     FOLLOW UP: Our staff will call the number listed on your records the next business day following your procedure to check on you and address any questions or concerns that you may have regarding the information given to you following your procedure. If we do not reach you, we will leave a message.  However, if you are feeling well and you are not experiencing any problems, there is no need to return our call.  We will assume that you have returned to your regular daily activities without incident.  If any biopsies were taken you will be contacted by phone or by letter within the next 1-3 weeks.  Please call us at [phone] if you have not heard about the biopsies in 3 weeks.   SIGNATURES/CONFIDENTIALITY: You and/or your care partner have signed paperwork which will be entered into your electronic medical record.  These signatures attest to the fact that that the information above on your After Visit Summary has been reviewed and is understood.  Full responsibility of the confidentiality of this discharge information lies with you and/or your care-partner.  Please read over handouts about polyps, diverticulosis and high fiber diets  Continue your normal medications

## 2016-03-14 NOTE — Progress Notes (Signed)
Report given to PACU RN, vss 

## 2016-03-15 ENCOUNTER — Telehealth: Payer: Self-pay

## 2016-03-15 NOTE — Telephone Encounter (Signed)

## 2016-03-21 ENCOUNTER — Encounter: Payer: Self-pay | Admitting: Gastroenterology

## 2016-04-04 DIAGNOSIS — H53032 Strabismic amblyopia, left eye: Secondary | ICD-10-CM | POA: Diagnosis not present

## 2016-04-04 DIAGNOSIS — H2513 Age-related nuclear cataract, bilateral: Secondary | ICD-10-CM | POA: Diagnosis not present

## 2016-04-04 DIAGNOSIS — H35372 Puckering of macula, left eye: Secondary | ICD-10-CM | POA: Diagnosis not present

## 2017-03-21 DIAGNOSIS — K5790 Diverticulosis of intestine, part unspecified, without perforation or abscess without bleeding: Secondary | ICD-10-CM | POA: Diagnosis not present

## 2017-06-03 IMAGING — DX DG ELBOW 2V*L*
2 series · 2 of 2 positions shown · non-contrast
Comparison: None.

CLINICAL DATA: Acute onset of worsening left anterior elbow pain
and swelling, with erythema. Chills. Initial encounter.

EXAM:
LEFT ELBOW - 2 VIEW

[elbow ap]
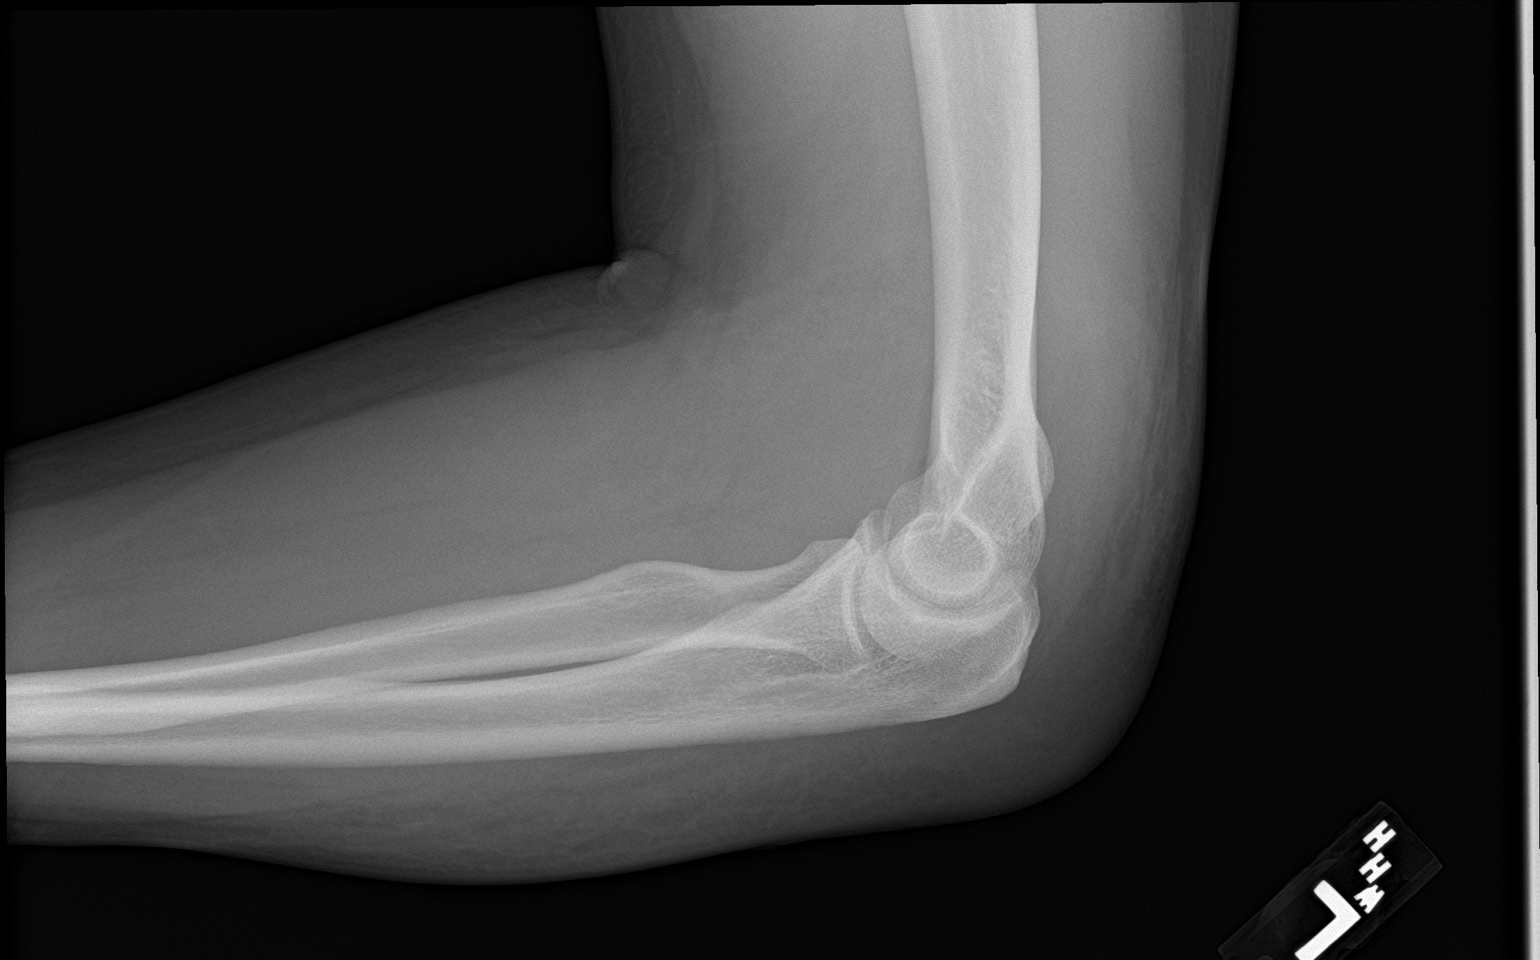

[elbow lat]
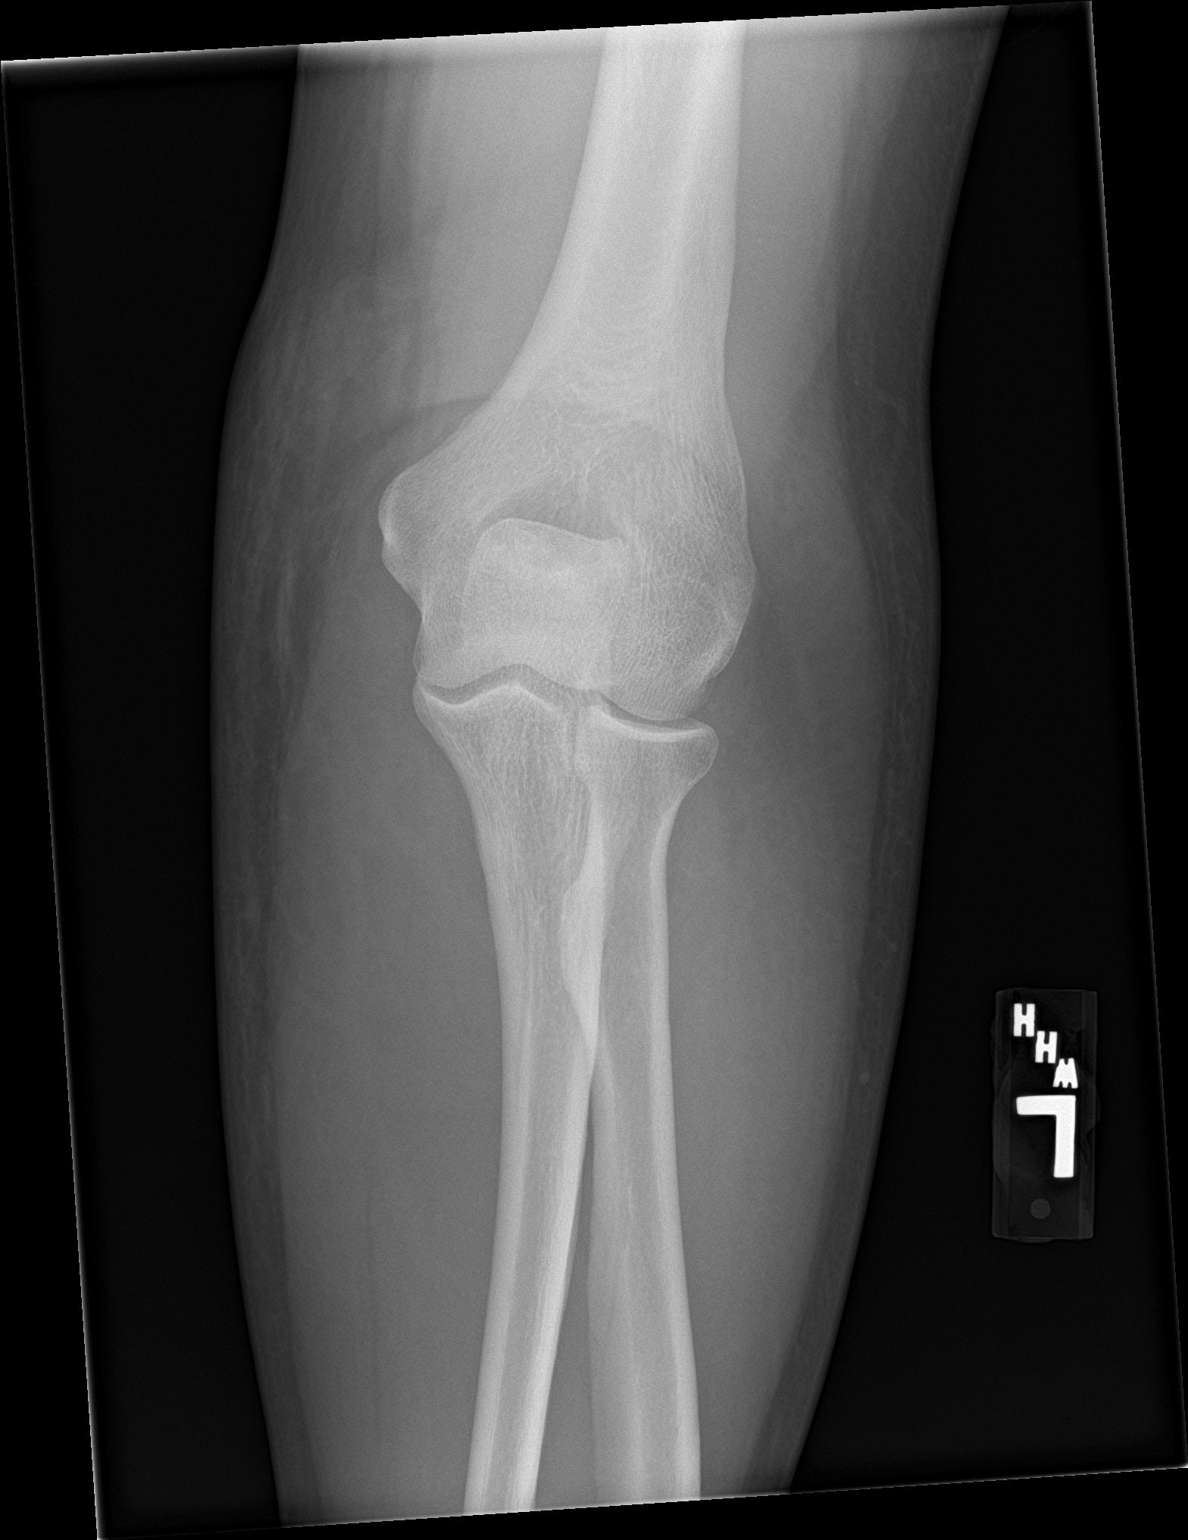

[2 of 2 positions shown; findings below may reference images not displayed]

FINDINGS: There is no evidence of fracture or dislocation. The visualized
joint spaces are preserved. No significant joint effusion is
identified. Diffuse soft tissue swelling is noted about the dorsum
of the the distal upper arm and proximal forearm.
IMPRESSION: No evidence of fracture or dislocation. Diffuse soft tissue swelling
noted dorsally.

## 2017-06-23 DIAGNOSIS — L400 Psoriasis vulgaris: Secondary | ICD-10-CM | POA: Diagnosis not present

## 2017-06-26 DIAGNOSIS — L400 Psoriasis vulgaris: Secondary | ICD-10-CM | POA: Diagnosis not present

## 2017-06-29 ENCOUNTER — Telehealth: Payer: Self-pay

## 2017-06-30 NOTE — Telephone Encounter (Signed)
PPD Reading faxed.

## 2017-07-12 ENCOUNTER — Encounter: Payer: Self-pay | Admitting: Pharmacist

## 2017-07-12 ENCOUNTER — Telehealth: Payer: Self-pay | Admitting: Pharmacist

## 2017-07-12 ENCOUNTER — Ambulatory Visit (HOSPITAL_BASED_OUTPATIENT_CLINIC_OR_DEPARTMENT_OTHER): Payer: 59 | Admitting: Pharmacist

## 2017-07-12 DIAGNOSIS — Z79899 Other long term (current) drug therapy: Secondary | ICD-10-CM

## 2017-07-12 MED ORDER — APREMILAST 30 MG PO TABS: 1.0000 | ORAL_TABLET | Freq: Two times a day (BID) | ORAL | 11 refills | Status: DC

## 2017-07-12 NOTE — Telephone Encounter (Signed)
Called patient to schedule an appointment for the [facility]. He will plan to come in the next two days (he is off this week) and will call before he comes.

## 2017-07-12 NOTE — Progress Notes (Signed)
   S: Patient presents to [facility] for review of their specialty medication therapy.  Patient is currently taking Otezla for psoriasis. Patient is managed by Dr. Nevada Crane for this.   Adherence: denies any missed doses  Efficacy: Has noticed some improvement in the plaques on his elbows.  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 No other issues GI upset: had some minor GI upset, notices that he gets full earlier than before so he is excited that it might lead to some weight loss. Weight loss: denies right now Neuropsychiatric effects: denies  He is not interested in the biologics due to the adverse effects. 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  O:     Lab Results  Component Value Date   WBC 12.1 (H) 05/15/2015   HGB 14.0 05/15/2015   HCT 41.7 05/15/2015   MCV 87.8 05/15/2015   PLT 262 05/15/2015      Chemistry      Component Value Date/Time   NA 138 05/15/2015 2345   K 3.7 05/15/2015 2345   CL 106 05/15/2015 2345   CO2 22 05/15/2015 2345   BUN 21 (H) 05/15/2015 2345   CREATININE 0.95 05/15/2015 2345      Component Value Date/Time   CALCIUM 8.9 05/15/2015 2345   ALKPHOS 58 11/27/2007 0525   AST 23 11/27/2007 0525   ALT 28 11/27/2007 0525   BILITOT 0.9 11/27/2007 0525       A/P: 1. Medication review: patient is currently on Otezla for psoriasis and is tolerating it well with some thinning of the plaques already per his report. Reviewed the medication including the following: apremilast inhibits phosphodiesterase 4 (PDE4) specific for cyclic adenosine monophosphate (cAMP) which results in increased intracellular cAMP levels and regulation of numerous inflammatory mediators (eg, decreased expression of nitric oxide synthase, TNF-alpha, and interleukin [IL]-23, as well as increased IL-10.  Possible adverse effects include weight loss, GI upset, headache, and mood changes. Renal function should be routinely monitored. No recommendations for any changes at this time.  Christella Hartigan, PharmD, BCPS, BCACP, [facility] [phone]

## 2017-10-04 DIAGNOSIS — H52201 Unspecified astigmatism, right eye: Secondary | ICD-10-CM | POA: Diagnosis not present

## 2017-10-04 DIAGNOSIS — H524 Presbyopia: Secondary | ICD-10-CM | POA: Diagnosis not present

## 2017-10-04 DIAGNOSIS — H53032 Strabismic amblyopia, left eye: Secondary | ICD-10-CM | POA: Diagnosis not present

## 2017-10-04 DIAGNOSIS — H5211 Myopia, right eye: Secondary | ICD-10-CM | POA: Diagnosis not present

## 2017-10-04 DIAGNOSIS — H2513 Age-related nuclear cataract, bilateral: Secondary | ICD-10-CM | POA: Diagnosis not present

## 2017-10-04 DIAGNOSIS — H35372 Puckering of macula, left eye: Secondary | ICD-10-CM | POA: Diagnosis not present

## 2018-02-13 ENCOUNTER — Other Ambulatory Visit: Payer: Self-pay | Admitting: Pharmacist

## 2018-02-13 ENCOUNTER — Telehealth: Payer: Self-pay | Admitting: Pharmacist

## 2018-02-13 MED ORDER — RISANKIZUMAB-RZAA(150 MG DOSE) 75 MG/0.83ML ~~LOC~~ PSKT: 2.0000 | PREFILLED_SYRINGE | SUBCUTANEOUS | 4 refills | Status: DC

## 2018-02-13 NOTE — Telephone Encounter (Signed)
Patient called to check on the status of his prescription. Our pharmacy has still not received a prescription. He reports that the prescription was sent to [facility] I will notify our pharmacy and request that they transfer it here. Patient aware.

## 2018-02-14 ENCOUNTER — Other Ambulatory Visit: Payer: Self-pay | Admitting: Pharmacist

## 2018-02-14 ENCOUNTER — Ambulatory Visit (INDEPENDENT_AMBULATORY_CARE_PROVIDER_SITE_OTHER): Payer: 59 | Admitting: Pharmacist

## 2018-02-14 DIAGNOSIS — Z79899 Other long term (current) drug therapy: Secondary | ICD-10-CM

## 2018-02-14 MED ORDER — RISANKIZUMAB-RZAA(150 MG DOSE) 75 MG/0.83ML ~~LOC~~ PSKT: 2.0000 | PREFILLED_SYRINGE | SUBCUTANEOUS | 4 refills | Status: DC

## 2018-02-14 NOTE — Progress Notes (Signed)
   S: Patient presents to [facility] for review of their specialty medication therapy.  Patient is currently taking Skyrizi for psoriasis. Patient is managed by Dr. Nevada Crane for this.   Adherence: has not started yet  Efficacy: has not started yet. Recently failed [place].   Dosing: Plaque psoriasis, moderate to severe: SubQ: Two consecutive injections (75 mg each) for a total dose of 150 mg at weeks 0, 4, and then every 12 weeks thereafter.  Screening: TB test: completed per patient  Monitoring: S/sx of infection: denies S/sx of hypersensitivity/injection site reaction: has not started yet   O:     Lab Results  Component Value Date   WBC 12.1 (H) 05/15/2015   HGB 14.0 05/15/2015   HCT 41.7 05/15/2015   MCV 87.8 05/15/2015   PLT 262 05/15/2015      Chemistry      Component Value Date/Time   NA 138 05/15/2015 2345   K 3.7 05/15/2015 2345   CL 106 05/15/2015 2345   CO2 22 05/15/2015 2345   BUN 21 (H) 05/15/2015 2345   CREATININE 0.95 05/15/2015 2345      Component Value Date/Time   CALCIUM 8.9 05/15/2015 2345   ALKPHOS 58 11/27/2007 0525   AST 23 11/27/2007 0525   ALT 28 11/27/2007 0525   BILITOT 0.9 11/27/2007 0525       A/P: 1. Medication review: Patient currently on [place] for psoriasis. Reviewed the medication with the patient, including the following: Orson Ape is a monoclonal antibody used in the treatment of psoriasis. Patient educated on purpose, proper use and potential adverse effects of Skyrizi. Possible adverse effects are infections, headache, and injection site reactions. Live vaccinations should be avoided while on therapy. No recommendations for any changes.     Christella Hartigan, PharmD, BCPS, BCACP, CPP Clinical Pharmacist Practitioner  [phone]

## 2018-06-13 ENCOUNTER — Other Ambulatory Visit (HOSPITAL_COMMUNITY): Payer: Self-pay | Admitting: Internal Medicine

## 2018-06-13 ENCOUNTER — Ambulatory Visit (HOSPITAL_COMMUNITY)
Admission: RE | Admit: 2018-06-13 | Discharge: 2018-06-13 | Disposition: A | Discharge status: Home / Self Care | Payer: 59 | Source: Ambulatory Visit | Attending: Internal Medicine | Admitting: Internal Medicine

## 2018-06-13 DIAGNOSIS — N200 Calculus of kidney: Secondary | ICD-10-CM | POA: Insufficient documentation

## 2018-06-13 DIAGNOSIS — N202 Calculus of kidney with calculus of ureter: Secondary | ICD-10-CM | POA: Diagnosis not present

## 2018-10-08 DIAGNOSIS — E785 Hyperlipidemia, unspecified: Secondary | ICD-10-CM | POA: Diagnosis not present

## 2018-10-08 DIAGNOSIS — R7301 Impaired fasting glucose: Secondary | ICD-10-CM | POA: Diagnosis not present

## 2018-10-08 DIAGNOSIS — I10 Essential (primary) hypertension: Secondary | ICD-10-CM | POA: Diagnosis not present

## 2018-10-08 DIAGNOSIS — G4733 Obstructive sleep apnea (adult) (pediatric): Secondary | ICD-10-CM | POA: Diagnosis not present

## 2018-10-09 ENCOUNTER — Other Ambulatory Visit: Payer: Self-pay | Admitting: Internal Medicine

## 2018-10-09 DIAGNOSIS — I2510 Atherosclerotic heart disease of native coronary artery without angina pectoris: Secondary | ICD-10-CM

## 2018-10-17 ENCOUNTER — Ambulatory Visit (HOSPITAL_COMMUNITY): Payer: 59

## 2018-10-22 ENCOUNTER — Ambulatory Visit (HOSPITAL_COMMUNITY)
Admission: RE | Admit: 2018-10-22 | Discharge: 2018-10-22 | Disposition: A | Discharge status: Home / Self Care | Payer: 59 | Source: Ambulatory Visit | Attending: Internal Medicine | Admitting: Internal Medicine

## 2018-10-22 ENCOUNTER — Other Ambulatory Visit: Payer: Self-pay

## 2018-10-22 DIAGNOSIS — I2510 Atherosclerotic heart disease of native coronary artery without angina pectoris: Secondary | ICD-10-CM

## 2018-10-30 ENCOUNTER — Telehealth: Payer: Self-pay | Admitting: Cardiovascular Disease

## 2018-10-30 NOTE — Telephone Encounter (Signed)
Attempted to return call to [facility] with Dr. Ria Comment office... office recording picked up hours 8:30am to 5:00pm.. 5:27pm. Will try again in the morning.

## 2018-10-30 NOTE — Telephone Encounter (Signed)
New message   Per Martin Majestic need to know why the entire report is not there. It is a CT calcium scoring. This report is out on epic. Martin Majestic states that Dr. Johnsie Cancel was the reader. Please call.

## 2018-10-31 NOTE — Telephone Encounter (Signed)
I was not the reader !! I was on vacation all week The other CT reading doctors should have read it will see if I can find it today

## 2018-11-12 ENCOUNTER — Encounter: Payer: Self-pay | Admitting: Cardiology

## 2018-11-12 NOTE — Progress Notes (Signed)
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  Based on records provided by Jesus Ramos he was treated for diverticulitis and renal colic back in February at which time CT imaging noted incidental right coronary artery atherosclerosis as well as iliac artery atherosclerosis.  More recently in July he underwent a CT cardiac calcium score which was found to be elevated at 38 (94th percentile for age and sex matched controls) with description of dense, diffuse three-vessel coronary calcification particularly noted within the entire LAD.  Noncardiac findings included presence of pulmonary artery enlargement.  He presents for evaluation today, we discussed his imaging studies in detail and personally reviewed the CT images.  He also brought me in a copy of an old ECG from 2010 that showed normal sinus rhythm.  He tells me that he does not feel any sense of exertional limitation at this point, no chest tightness, no breathlessness beyond NYHA class II with typical activities.  He recently vacation at the beach for a few weeks, went for 2 mile walks without difficulty.  He has also done some fairly strenuous outdoor yard work without specific symptoms.  He states that this has been a "wake-up call" and he is focused on diet and weight loss.  He states that his daughter is a vegan and has been helping him with dietary measures.  He is also focused on getting his sleep apnea under better control, has follow-up scheduled with Pulmonary.  I reviewed his  medications.  He states that he has been compliant with therapy.  Lipids outlined below were checked about 1 year ago.  We discussed getting an updated panel.  Past Medical History:  Diagnosis Date   Essential hypertension    History of colonic polyps    History of diverticulitis    Hyperlipidemia    Impaired fasting glucose    OSA on CPAP     Past Surgical History:  Procedure Laterality Date   COLONOSCOPY  05/23/2011   Procedure: COLONOSCOPY;  Surgeon: Jesus Loffler, MD;  Location: [facility];  Service: Endoscopy;  Laterality: N/A;   EYE SURGERY     NASAL SEPTUM SURGERY     TONSILLECTOMY      Current Outpatient Medications  Medication Sig Dispense Refill   amLODipine (NORVASC) 5 MG tablet Take 5 mg by mouth daily.     Apremilast (OTEZLA) 30 MG TABS Take 1 tablet by mouth 2 (two) times daily. 60 tablet 11   aspirin 81 MG tablet Take 162 mg by mouth daily.      escitalopram (LEXAPRO) 20 MG tablet Take 20 mg by mouth daily.     ramipril (ALTACE) 10 MG capsule Take 10 mg by mouth daily.     Risankizumab-rzaa,150 MG Dose, (SKYRIZI, 150 MG DOSE,) 75 MG/0.83ML PSKT Inject 2 Syringes into the skin as directed. At week 0, week 4, then every 12 weeks 8 each 4   simvastatin (ZOCOR) 20 MG tablet Take 20 mg by mouth every evening.     Current Facility-Administered Medications  Medication Dose Route  Frequency Provider Last Rate Last Dose   0.9 %  sodium chloride infusion  500 mL Intravenous Continuous Jesus Banister, MD       0.9 %  sodium chloride infusion  500 mL Intravenous Continuous Jesus Banister, MD       Allergies:  Patient has no known allergies.   Social History: The patient  reports that he has never smoked. He has never used smokeless tobacco. He reports that he does not drink alcohol or use drugs.   Family History: The patient's family history includes Heart disease in his father.   ROS:  Please see the history of present illness. Otherwise, complete  review of systems is positive for none.  All other systems are reviewed and negative.   Physical Exam: VS:  BP 134/84    Pulse 67    Ht 6' 1.5" (1.867 m)    Wt (!) 306 lb (138.8 kg)    SpO2 97%    BMI 39.82 kg/m , BMI Body mass index is 39.82 kg/m.  Wt Readings from Last 3 Encounters:  11/13/18 (!) 306 lb (138.8 kg)  03/14/16 (!) 301 lb (136.5 kg)  05/27/15 (!) 301 lb (136.5 kg)    General: Morbidly obese male, appears comfortable at rest. HEENT: Conjunctiva and lids normal, wearing a mask. Neck: Supple, no elevated JVP or carotid bruits, no thyromegaly. Lungs: Clear to auscultation, nonlabored breathing at rest. Cardiac: Regular rate and rhythm, no S3 or significant systolic murmur, no pericardial rub. Abdomen: Soft, nontender, bowel sounds present. Extremities: No pitting edema evident, distal pulses 2+. Skin: Warm and dry. Musculoskeletal: No kyphosis. Neuropsychiatric: Alert and oriented x3, affect grossly appropriate.  ECG:  Previous tracing from 01/19/2009 showed normal sinus rhythm.  Recent Labwork:  Per Jesus Ramos most recent note: Total cholesterol 122, HDL 32, LDL 68, glucose 115, TSH 4.36, PSA 1.18  Other Studies Reviewed Today:  Coronary calcium score 10/31/2018: FINDINGS: Non-cardiac: See separate report from [facility].  Ascending aorta: Mildly dilated 3.9 cm  Pericardium: Normal  Coronary arteries: Dense diffuse 3 vessel coronary calcium particularly in the entire LAD  IMPRESSION: Coronary calcium score of 770. This was 59 th percentile for age and sex matched control.  CT renal stone study 06/13/2018: FINDINGS: Lower chest: Right coronary artery atherosclerotic calcification.  Hepatobiliary: Hypodense lesion in the right hepatic lobe 2.5 by 2.1 cm on image 23/2, internal density 3 Hounsfield units, most likely a cyst, and not appreciably changed from 2016. Nonspecific 4 mm hypodense lesion peripherally in the right hepatic lobe on  image 21/2. Contracted gallbladder.  Pancreas: Unremarkable  Spleen: Unremarkable  Adrenals/Urinary Tract: Both adrenal glands appear normal.  2 mm left UVJ calculus associated with borderline left hydroureter.  1 mm right mid kidney nonobstructive renal calculus, image 63/5.  Stomach/Bowel: Descending and sigmoid colon diverticulosis. Suspected mild acute sigmoid diverticulitis given the adjacent mesenteric stranding on image 52/5.  Vascular/Lymphatic: Iliac artery atherosclerotic calcification.  Reproductive: Upper normal prostate size.  Other: No supplemental non-categorized findings.  Musculoskeletal: Degenerative disc disease at L2-3, L4-5, and L5-S1, with mild right foraminal impingement at L5-S1 due to spurring, and potential right subarticular lateral recess stenosis at L4-5 due to disc protrusion. Small umbilical hernia contains adipose tissue.  IMPRESSION: 1. 85mm left UVJ calculus associated with borderline left hydroureter. No overt hydronephrosis. A similar appearance was present on 08/04/2014, although I would favor that these are different calculi rather than a chronic calculus. 2. 1 mm right mid kidney nonobstructive renal  calculus. 3. Descending and sigmoid colon diverticulosis with mild sigmoid colon diverticulitis. No extraluminal gas or abscess. 4. Potential right-sided impingement at L4-5 and L5-S1 due to spondylosis and degenerative disc disease. 5. Right coronary artery atherosclerosis. 6. Small hypodense lesions in the right hepatic lobe are likely cysts.  Assessment and Plan:  1.  Multivessel coronary artery calcification most predominant in the LAD in diffuse fashion documented by recent CT imaging with high coronary calcium score of 770.  We have discussed his increased risk of cardiac events, indications for aggressive measures for risk factor modification.  He states that he is focused on diet and weight loss and plans to get his  sleep apnea under better control as well.  He will continue aspirin and statin therapy, we will check FLP and LFTs to ensure adequate management.  He does not report any substantial functional limitation or exertional symptoms which is reassuring.  I do think that it would be useful to have further physiologic information and he will proceed with a Lexiscan Myoview (2-day protocol) to ensure that he does not have a large ischemic territory particularly in the LAD distribution.  If that is not the case, we will continue cautious observation and follow-up.  2.  Enlarged pulmonary arteries by CT imaging.  Possibility of pulmonary hypertension could be considered particularly with suboptimally managed OSA.  He has no history of known thromboembolic disease and otherwise does not report any unusual degree of shortness of breath or cough.  We will obtain an echocardiogram for further investigation.  3.  Essential hypertension, on Altace and Norvasc.  4.  Obesity, he states that he is focused on diet and weight loss.  Medication Adjustments/Labs and Tests Ordered: Current medicines are reviewed at length with the patient today.  Concerns regarding medicines are outlined above.   Tests Ordered: Orders Placed This Encounter  Procedures   NM Myocar Multi W/Spect W/Wall Motion / EF   Lipid Profile   Hepatic function panel   ECHOCARDIOGRAM COMPLETE    Medication Changes: No orders of the defined types were placed in this encounter.   Disposition:  Follow up test results and determine disposition plan.  Signed, Jesus Sark, MD, [facility] 11/13/2018 12:43 PM    [place] [facility] [address] [place], [place] [address] Phone: [phone]; Fax: [phone]

## 2018-11-13 ENCOUNTER — Encounter: Payer: Self-pay | Admitting: Cardiology

## 2018-11-13 ENCOUNTER — Other Ambulatory Visit: Payer: Self-pay

## 2018-11-13 ENCOUNTER — Ambulatory Visit: Payer: 59 | Admitting: Cardiology

## 2018-11-13 VITALS — BP 134/84 | HR 67 | Ht 73.5 in | Wt 306.0 lb

## 2018-11-13 DIAGNOSIS — R931 Abnormal findings on diagnostic imaging of heart and coronary circulation: Secondary | ICD-10-CM | POA: Diagnosis not present

## 2018-11-13 DIAGNOSIS — I2510 Atherosclerotic heart disease of native coronary artery without angina pectoris: Secondary | ICD-10-CM

## 2018-11-13 DIAGNOSIS — E782 Mixed hyperlipidemia: Secondary | ICD-10-CM

## 2018-11-13 DIAGNOSIS — I272 Pulmonary hypertension, unspecified: Secondary | ICD-10-CM | POA: Diagnosis not present

## 2018-11-13 DIAGNOSIS — I10 Essential (primary) hypertension: Secondary | ICD-10-CM | POA: Diagnosis not present

## 2018-11-13 NOTE — Patient Instructions (Signed)
Medication Instructions:   Labwork: Fasting Lipid Profile and LFT's  Procedures/Testing:  Your physician has requested that you have an echocardiogram. Echocardiography is a painless test that uses sound waves to create images of your heart. It provides your doctor with information about the size and shape of your heart and how well your heart's chambers and valves are working. This procedure takes approximately one hour. There are no restrictions for this procedure.   Follow-Up:   Any Additional Special Instructions Will Be Listed Below (If Applicable).     If you need a refill on your cardiac medications before your next appointment, please call your pharmacy.      Thank you for choosing [place] !

## 2018-11-16 ENCOUNTER — Other Ambulatory Visit (HOSPITAL_COMMUNITY)
Admission: RE | Admit: 2018-11-16 | Discharge: 2018-11-16 | Disposition: A | Discharge status: Home / Self Care | Payer: 59 | Source: Ambulatory Visit | Attending: Cardiology | Admitting: Cardiology

## 2018-11-16 ENCOUNTER — Telehealth: Payer: Self-pay

## 2018-11-16 DIAGNOSIS — E782 Mixed hyperlipidemia: Secondary | ICD-10-CM | POA: Diagnosis not present

## 2018-11-16 LAB — HEPATIC FUNCTION PANEL
ALT: 21 U/L (ref 0–44)
AST: 19 U/L (ref 15–41)
Albumin: 4.1 g/dL (ref 3.5–5.0)
Alkaline Phosphatase: 60 U/L (ref 38–126)
Bilirubin, Direct: 0.1 mg/dL (ref 0.0–0.2)
Indirect Bilirubin: 0.7 mg/dL (ref 0.3–0.9)
Total Bilirubin: 0.8 mg/dL (ref 0.3–1.2)
Total Protein: 7.4 g/dL (ref 6.5–8.1)

## 2018-11-16 LAB — LIPID PANEL
Cholesterol: 156 mg/dL (ref 0–200)
HDL: 41 mg/dL (ref >40)
LDL Cholesterol: 96 mg/dL (ref 0–99)
Total CHOL/HDL Ratio: 3.8 RATIO
Triglycerides: 93 mg/dL (ref <150)
VLDL: 19 mg/dL (ref 0–40)

## 2018-11-16 MED ORDER — ROSUVASTATIN CALCIUM 10 MG PO TABS: 10.0000 mg | ORAL_TABLET | Freq: Every day | ORAL | 3 refills | Status: DC

## 2018-11-16 NOTE — Telephone Encounter (Signed)
Patient notified of results, will stop Zocor and start Crestor 10 mg daily at bedtime.Mailed lab slip to repeat Lipid and LFT's in 12 weeks

## 2018-11-16 NOTE — Telephone Encounter (Signed)
-----   Message from Satira Sark, MD sent at 11/16/2018 10:51 AM EDT ----- Results reviewed.  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 Willey Blade.  We had talked about considering a higher potency statin at his recent consultation.  If he would like to, would consider switching from Zocor to Crestor beginning at 10 mg daily.  If he tolerates this switch well I would anticipate repeating a lipid panel with LFTs in about 12 weeks.

## 2018-11-26 ENCOUNTER — Other Ambulatory Visit (HOSPITAL_COMMUNITY): Payer: 59

## 2018-11-26 ENCOUNTER — Ambulatory Visit (HOSPITAL_BASED_OUTPATIENT_CLINIC_OR_DEPARTMENT_OTHER)
Admission: RE | Admit: 2018-11-26 | Discharge: 2018-11-26 | Disposition: A | Discharge status: Home / Self Care | Payer: 59 | Source: Ambulatory Visit | Attending: Cardiology | Admitting: Cardiology

## 2018-11-26 ENCOUNTER — Ambulatory Visit (HOSPITAL_COMMUNITY)
Admission: RE | Admit: 2018-11-26 | Discharge: 2018-11-26 | Disposition: A | Discharge status: Home / Self Care | Payer: 59 | Source: Ambulatory Visit | Attending: Cardiology | Admitting: Cardiology

## 2018-11-26 ENCOUNTER — Encounter (HOSPITAL_COMMUNITY)
Admission: RE | Admit: 2018-11-26 | Discharge: 2018-11-26 | Disposition: A | Discharge status: Home / Self Care | Payer: 59 | Source: Ambulatory Visit | Attending: Cardiology | Admitting: Cardiology

## 2018-11-26 ENCOUNTER — Other Ambulatory Visit: Payer: Self-pay

## 2018-11-26 DIAGNOSIS — R931 Abnormal findings on diagnostic imaging of heart and coronary circulation: Secondary | ICD-10-CM | POA: Insufficient documentation

## 2018-11-26 DIAGNOSIS — I2510 Atherosclerotic heart disease of native coronary artery without angina pectoris: Secondary | ICD-10-CM | POA: Insufficient documentation

## 2018-11-26 DIAGNOSIS — I272 Pulmonary hypertension, unspecified: Secondary | ICD-10-CM | POA: Insufficient documentation

## 2018-11-26 MED ORDER — SODIUM CHLORIDE 0.9% FLUSH
INTRAVENOUS | Status: AC
  Administered 2018-11-26: 10 mL via INTRAVENOUS
  Filled 2018-11-26: qty 10

## 2018-11-26 MED ORDER — TECHNETIUM TC 99M TETROFOSMIN IV KIT
30.0000 | PACK | Freq: Once | INTRAVENOUS | Status: AC | PRN
  Administered 2018-11-26: 30 via INTRAVENOUS

## 2018-11-26 MED ORDER — REGADENOSON 0.4 MG/5ML IV SOLN
INTRAVENOUS | Status: AC
  Administered 2018-11-26: 09:00:00 0.4 mg via INTRAVENOUS
  Filled 2018-11-26: qty 5

## 2018-11-26 NOTE — Progress Notes (Signed)
*  PRELIMINARY RESULTS* Echocardiogram 2D Echocardiogram has been performed.  Leavy Cella 11/26/2018, 10:28 AM

## 2018-11-27 ENCOUNTER — Encounter (HOSPITAL_COMMUNITY)
Admission: RE | Admit: 2018-11-27 | Discharge: 2018-11-27 | Disposition: A | Discharge status: Home / Self Care | Payer: 59 | Source: Ambulatory Visit | Attending: Cardiology | Admitting: Cardiology

## 2018-11-27 LAB — NM MYOCAR MULTI W/SPECT W/WALL MOTION / EF
LV dias vol: 98 mL (ref 62–150)
LV sys vol: 41 mL
Peak HR: 81 {beats}/min
RATE: 0.37
Rest HR: 75 {beats}/min
SDS: 2
SRS: 0
SSS: 2
TID: 0.98

## 2018-11-27 MED ORDER — TECHNETIUM TC 99M TETROFOSMIN IV KIT
30.0000 | PACK | Freq: Once | INTRAVENOUS | Status: AC | PRN
  Administered 2018-11-27: 09:00:00 29 via INTRAVENOUS

## 2018-12-03 ENCOUNTER — Encounter (HOSPITAL_COMMUNITY): Payer: 59

## 2018-12-04 ENCOUNTER — Other Ambulatory Visit (HOSPITAL_COMMUNITY): Payer: 59

## 2018-12-06 ENCOUNTER — Other Ambulatory Visit: Payer: Self-pay

## 2018-12-06 ENCOUNTER — Encounter: Payer: Self-pay | Admitting: Pulmonary Disease

## 2018-12-06 ENCOUNTER — Ambulatory Visit (INDEPENDENT_AMBULATORY_CARE_PROVIDER_SITE_OTHER): Payer: 59 | Admitting: Pulmonary Disease

## 2018-12-06 VITALS — BP 142/84 | HR 72 | Temp 97.2°F | Ht 73.5 in | Wt 304.0 lb

## 2018-12-06 DIAGNOSIS — G4733 Obstructive sleep apnea (adult) (pediatric): Secondary | ICD-10-CM | POA: Diagnosis not present

## 2018-12-06 NOTE — Patient Instructions (Signed)
Will arrange for home sleep study Will call to arrange for follow up after sleep study reviewed  

## 2018-12-06 NOTE — Progress Notes (Signed)
[facility]  Chief Complaint  Patient presents with  . 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 He just needs to re-establish for his mask. He currently uses a mandibular advice that is not working. Most recent sleep study was 10 years ago.     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  He was previously seen by Dr. Gwenette Greet.  He had sleep study in 2008 and found to have moderate sleep apnea.  He was tried on CPAP, but couldn't find proper fitting mask and didn't feel comfortable with pressure setting.  He then tried oral appliance.  He found out, after recent dental work, that the oral appliance was affecting his teeth alignment.  He has gained about 28 lbs since his sleep study in 2008.  He went on trip to [place] with his daughter.  They were staying in same room.  He woke up to find his daughter trying to sleep in the bathroom to get away from his snoring.    He takes about 30 minutes to fall asleep.  Sometimes takes melatonin.  Wakes up sometimes to use the bathroom.  Has noticed feeling more tired during the day. Not using anything to help him stay awake.  He denies sleep walking, sleep talking, bruxism, or nightmares.  There is no history of restless legs.  He denies sleep hallucinations, sleep paralysis, or cataplexy.  The Epworth score is 3 out of 24.  He had recent cardiac CT.  Mentioned concerned for pulmonary artery enlargement.  Recent Echo didn't show evidence for pulmonary hypertension.     Physical Exam:   Appearance - well kempt   ENMT - clear nasal mucosa, midline nasal  septum, no oral exudates, no LAN, trachea midline, MP 3   Respiratory - normal chest wall, normal respiratory effort, no accessory muscle use, no wheeze/rales  CV - s1s2 regular rate and rhythm, no murmurs, no peripheral edema, radial pulses symmetric  GI - soft, non tender, no masses  Lymph - no adenopathy noted in neck and axillary areas  MSK - normal gait  Ext - no cyanosis, clubbing, or joint inflammation noted  Skin - no rashes, lesions, or ulcers  Neuro - normal strength, oriented x 3  Psych - normal mood and affect  Discussion:  He has snoring, sleep disruption, and daytime sleepiness.  He has history of hypertension and prior diagnosis of obstructive sleep apnea.  He has gained weight since prior sleep study.  He was tried on oral appliance, but this caused trouble with dental alignment.  Assessment/Plan:   Snoring with excessive daytime sleepiness. - will need to arrange for a home sleep study and then likely arrange for auto CPAP set up - advised him to review mask options on line  Obesity. - discussed how weight can impact sleep and risk for sleep disordered breathing - discussed options to assist with weight loss: combination of diet modification, cardiovascular and strength training exercises   Patient Instructions  Will arrange for home sleep study Will call to arrange for follow up after sleep study reviewed  Chesley Mires, MD [place] [facility] Pager: [phone] 12/06/2018, 10:34 AM  Flow Sheet    Sleep tests:  PSG 10/05/06 >> AHI 20.7, SpO2 low 83%  Cardiac tests:  Echo 11/26/18 >> EF 60 to 65%, mod LVH  Review of Systems:  Reviewed and negative  Medications:   Allergies as of 12/06/2018   No Known Allergies     Medication List       Accurate as of December 06, 2018 10:34 AM. If you have any questions, ask your nurse or doctor.        amLODipine 5 MG tablet Commonly known as: NORVASC Take 5 mg by mouth daily.   Apremilast 30 MG Tabs Commonly known as: Otezla Take 1 tablet  by mouth 2 (two) times daily.   aspirin 81 MG tablet Take 162 mg by mouth daily.   escitalopram 20 MG tablet Commonly known as: LEXAPRO Take 20 mg by mouth daily.   ramipril 10 MG capsule Commonly known as: ALTACE Take 10 mg by mouth daily.   Risankizumab-rzaa(150 MG Dose) 75 MG/0.83ML Pskt Commonly known as: Skyrizi (150 MG Dose) Inject 2 Syringes into the skin as directed. At week 0, week 4, then every 12 weeks   rosuvastatin 10 MG tablet Commonly known as: CRESTOR Take 1 tablet (10 mg total) by mouth at bedtime.       Past Surgical History:  He  has a past surgical history that includes Tonsillectomy; Eye surgery; Nasal septum surgery; and Colonoscopy (05/23/2011).  Family History:  His family history includes Heart disease in his father.  Social History:  He  reports that he has never smoked. He has never used smokeless tobacco. He reports that he does not drink alcohol or use drugs.

## 2018-12-10 ENCOUNTER — Ambulatory Visit: Payer: 59

## 2018-12-10 ENCOUNTER — Other Ambulatory Visit: Payer: Self-pay

## 2018-12-10 DIAGNOSIS — G4733 Obstructive sleep apnea (adult) (pediatric): Secondary | ICD-10-CM

## 2018-12-12 DIAGNOSIS — G4733 Obstructive sleep apnea (adult) (pediatric): Secondary | ICD-10-CM | POA: Diagnosis not present

## 2018-12-13 ENCOUNTER — Telehealth: Payer: Self-pay | Admitting: Pulmonary Disease

## 2018-12-13 DIAGNOSIS — G4733 Obstructive sleep apnea (adult) (pediatric): Secondary | ICD-10-CM

## 2018-12-13 NOTE — Telephone Encounter (Signed)
LVMTCB x 2 for patient. 

## 2018-12-13 NOTE — Telephone Encounter (Signed)
HST 12/10/18 >> AHI 27.4, SpO2 low 84%   Please inform him that his sleep study shows moderate obstructive sleep apnea.  Options are to arrange for CPAP now and ROV in 2 months, or ROV now to discuss in more detail.  If he is agreeable to CPAP set up, then please arrange for auto CPAP range 5 to 20 cm H2O with heated humidity and mask of choice.  He would then need ROV in 2 months after CPAP set up.

## 2018-12-13 NOTE — Telephone Encounter (Signed)
Spoke with the pt and notified of results per Dr Halford Chessman  He verbalized understanding  He states agreeable to CPAP  The order sent to [facility]

## 2018-12-13 NOTE — Telephone Encounter (Signed)
LMOMTCB x 1 

## 2018-12-13 NOTE — Telephone Encounter (Signed)
Pt returned call

## 2018-12-21 DIAGNOSIS — G4733 Obstructive sleep apnea (adult) (pediatric): Secondary | ICD-10-CM | POA: Diagnosis not present

## 2019-01-20 DIAGNOSIS — G4733 Obstructive sleep apnea (adult) (pediatric): Secondary | ICD-10-CM | POA: Diagnosis not present

## 2019-01-23 DIAGNOSIS — H10829 Rosacea conjunctivitis, unspecified eye: Secondary | ICD-10-CM | POA: Diagnosis not present

## 2019-01-23 DIAGNOSIS — H2513 Age-related nuclear cataract, bilateral: Secondary | ICD-10-CM | POA: Diagnosis not present

## 2019-01-23 DIAGNOSIS — H53002 Unspecified amblyopia, left eye: Secondary | ICD-10-CM | POA: Diagnosis not present

## 2019-01-23 DIAGNOSIS — H02834 Dermatochalasis of left upper eyelid: Secondary | ICD-10-CM | POA: Diagnosis not present

## 2019-01-23 DIAGNOSIS — H02831 Dermatochalasis of right upper eyelid: Secondary | ICD-10-CM | POA: Diagnosis not present

## 2019-01-28 DIAGNOSIS — G4733 Obstructive sleep apnea (adult) (pediatric): Secondary | ICD-10-CM | POA: Diagnosis not present

## 2019-02-20 DIAGNOSIS — G4733 Obstructive sleep apnea (adult) (pediatric): Secondary | ICD-10-CM | POA: Diagnosis not present

## 2019-02-26 ENCOUNTER — Ambulatory Visit (HOSPITAL_BASED_OUTPATIENT_CLINIC_OR_DEPARTMENT_OTHER): Payer: 59 | Admitting: Pharmacist

## 2019-02-26 ENCOUNTER — Other Ambulatory Visit: Payer: Self-pay

## 2019-02-26 DIAGNOSIS — Z79899 Other long term (current) drug therapy: Secondary | ICD-10-CM

## 2019-02-26 MED ORDER — SKYRIZI (150 MG DOSE) 75 MG/0.83ML ~~LOC~~ PSKT: 2.0000 | PREFILLED_SYRINGE | SUBCUTANEOUS | 4 refills | Status: DC

## 2019-02-26 NOTE — Progress Notes (Signed)
   S: Patient presents for review of their specialty medication therapy.  Patient is currently taking Skyrizi for psoriasis. Patient is managed by Dr. Nevada Crane for this.   Adherence: confirms  Efficacy: pt reports that he is very pleased with the efficacy; reports as 90% +   Dosing: Plaque psoriasis, moderate to severe: SubQ: Two consecutive injections (75 mg each) for a total dose of 150 mg every 12 weeks  Screening: TB test: completed per patient  Monitoring: S/sx of infection: denies S/sx of hypersensitivity/injection site reaction: denies   O:  Lab Results  Component Value Date   WBC 12.1 (H) 05/15/2015   HGB 14.0 05/15/2015   HCT 41.7 05/15/2015   MCV 87.8 05/15/2015   PLT 262 05/15/2015     Chemistry      Component Value Date/Time   NA 138 05/15/2015 2345   K 3.7 05/15/2015 2345   CL 106 05/15/2015 2345   CO2 22 05/15/2015 2345   BUN 21 (H) 05/15/2015 2345   CREATININE 0.95 05/15/2015 2345      Component Value Date/Time   CALCIUM 8.9 05/15/2015 2345   ALKPHOS 60 11/16/2018 0737   AST 19 11/16/2018 0737   ALT 21 11/16/2018 0737   BILITOT 0.8 11/16/2018 0737      A/P: 1. Medication review: Patient currently on Pinon for psoriasis. Reviewed the medication with the patient, including the following: Orson Ape is a monoclonal antibody used in the treatment of psoriasis. Patient educated on purpose, proper use and potential adverse effects of Skyrizi. Possible adverse effects are infections, headache, and injection site reactions. Live vaccinations should be avoided while on therapy. No recommendations for any changes.   Benard Halsted, PharmD, [place] [phone]

## 2019-03-22 DIAGNOSIS — G4733 Obstructive sleep apnea (adult) (pediatric): Secondary | ICD-10-CM | POA: Diagnosis not present

## 2019-04-05 DIAGNOSIS — E785 Hyperlipidemia, unspecified: Secondary | ICD-10-CM | POA: Diagnosis not present

## 2019-04-05 DIAGNOSIS — G4733 Obstructive sleep apnea (adult) (pediatric): Secondary | ICD-10-CM | POA: Diagnosis not present

## 2019-04-05 DIAGNOSIS — I10 Essential (primary) hypertension: Secondary | ICD-10-CM | POA: Diagnosis not present

## 2019-04-22 DIAGNOSIS — G4733 Obstructive sleep apnea (adult) (pediatric): Secondary | ICD-10-CM | POA: Diagnosis not present

## 2019-05-23 DIAGNOSIS — G4733 Obstructive sleep apnea (adult) (pediatric): Secondary | ICD-10-CM | POA: Diagnosis not present

## 2019-06-20 DIAGNOSIS — G4733 Obstructive sleep apnea (adult) (pediatric): Secondary | ICD-10-CM | POA: Diagnosis not present

## 2019-07-21 DIAGNOSIS — G4733 Obstructive sleep apnea (adult) (pediatric): Secondary | ICD-10-CM | POA: Diagnosis not present

## 2019-08-05 ENCOUNTER — Other Ambulatory Visit (HOSPITAL_COMMUNITY): Payer: Self-pay | Admitting: Internal Medicine

## 2019-08-20 DIAGNOSIS — G4733 Obstructive sleep apnea (adult) (pediatric): Secondary | ICD-10-CM | POA: Diagnosis not present

## 2019-09-06 ENCOUNTER — Other Ambulatory Visit (HOSPITAL_COMMUNITY): Payer: Self-pay | Admitting: Cardiology

## 2019-09-10 ENCOUNTER — Encounter: Payer: Self-pay | Admitting: Cardiology

## 2019-09-10 ENCOUNTER — Other Ambulatory Visit: Payer: Self-pay

## 2019-09-10 ENCOUNTER — Ambulatory Visit: Payer: 59 | Admitting: Cardiology

## 2019-09-10 VITALS — BP 136/84 | HR 70 | Ht 73.0 in | Wt 316.0 lb

## 2019-09-10 DIAGNOSIS — I10 Essential (primary) hypertension: Secondary | ICD-10-CM

## 2019-09-10 DIAGNOSIS — R0989 Other specified symptoms and signs involving the circulatory and respiratory systems: Secondary | ICD-10-CM

## 2019-09-10 DIAGNOSIS — R931 Abnormal findings on diagnostic imaging of heart and coronary circulation: Secondary | ICD-10-CM

## 2019-09-10 DIAGNOSIS — E782 Mixed hyperlipidemia: Secondary | ICD-10-CM

## 2019-09-10 MED ORDER — ROSUVASTATIN CALCIUM 20 MG PO TABS: 20.0000 mg | ORAL_TABLET | Freq: Every day | ORAL | 3 refills | Status: DC

## 2019-09-10 NOTE — Progress Notes (Signed)
Cardiology Office Note  Date: 09/10/2019   ID: Jesus Ramos, Dr., DOB April 05, 1956, MRN [id number]  PCP:  Asencion Noble, Jesus Ramos  Cardiologist:  Rozann Lesches, Jesus Ramos Electrophysiologist:  None   Chief Complaint  Patient presents with  . 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  He presents for a follow-up visit.  From a cardiac perspective, he does not report any angina symptoms, no unusual degree of dyspnea on exertion or major functional limitation.  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  Cardiac testing from last year is reviewed below.  Myoview did not demonstrate any obvious ischemic territories and we continue medical therapy for risk factor reduction.  He is on aspirin, Norvasc, Altace, and Crestor which we discussed increasing to 20 mg daily.  His most recent LDL was 60 in April, LFTs normal.  We discussed diet and weight loss today as well.  I personally reviewed his ECG which shows normal sinus rhythm.  Past Medical History:  Diagnosis Date  . Essential hypertension   . History of colonic polyps   . History of diverticulitis   . Hyperlipidemia   . Impaired fasting glucose   . OSA on CPAP     Past Surgical History:  Procedure Laterality Date  . COLONOSCOPY  05/23/2011   Procedure: COLONOSCOPY;  Surgeon: Owens Loffler, Jesus Ramos;  Location: [facility];  Service: Endoscopy;  Laterality: N/A;  . EYE SURGERY    . NASAL SEPTUM SURGERY    . TONSILLECTOMY      Current Outpatient Medications  Medication Sig Dispense Refill  . amLODipine (NORVASC) 5 MG tablet Take 5 mg by mouth daily.    Marland Kitchen aspirin 81 MG tablet Take 162 mg by mouth daily.     Marland Kitchen escitalopram (LEXAPRO) 20 MG tablet Take 20 mg by mouth daily.    . ramipril (ALTACE) 10 MG capsule Take 10 mg by mouth daily.    . Risankizumab-rzaa,150 MG Dose, (SKYRIZI, 150 MG DOSE,) 75 MG/0.83ML PSKT Inject 2 Syringes into the skin  as directed. Every 4 weeks. 8 each 4  . rosuvastatin (CRESTOR) 20 MG tablet Take 1 tablet (20 mg total) by mouth daily. 90 tablet 3   Current Facility-Administered Medications  Medication Dose Route Frequency Provider Last Rate Last Admin  . 0.9 %  sodium chloride infusion  500 mL Intravenous Continuous Milus Banister, Jesus Ramos      . 0.9 %  sodium chloride infusion  500 mL Intravenous Continuous Milus Banister, Jesus Ramos       Allergies:  Patient has no known allergies.   ROS:   No palpitations or syncope.  Physical Exam: VS:  BP 136/84   Pulse 70   Ht 6\' 1"  (1.854 m)   Wt (!) 316 lb (143.3 kg)   SpO2 97%   BMI 41.69 kg/m , BMI Body mass index is 41.69 kg/m.  Wt Readings from Last 3 Encounters:  09/10/19 (!) 316 lb (143.3 kg)  12/06/18 (!) 304 lb (137.9 kg)  11/13/18 (!) 306 lb (138.8 kg)    General: Obese male, appears comfortable at rest. HEENT: Conjunctiva and lids normal, wearing a mask. Neck: Supple, no elevated JVP, soft right carotid bruit, no thyromegaly. Lungs: Clear to auscultation, nonlabored breathing at rest. Cardiac: Regular rate and rhythm, no S3 or significant systolic murmur. Abdomen: No bruit. Extremities: No pitting edema, distal pulses 2+.  ECG:  An ECG dated 01/19/2009 was personally reviewed today and demonstrated:  Normal sinus rhythm.  Recent Labwork:  October 2020: BUN 19, creatinine 0.93, potassium 4.6, AST 16, ALT 20, cholesterol 125, triglycerides 145, HDL 43, LDL 57, TSH 3.56, hemoglobin 14.6, platelets 257  Other Studies Reviewed Today:  Coronary calcium score 10/31/2018: FINDINGS: Non-cardiac: See separate report from [facility].  Ascending aorta: Mildly dilated 3.9 cm  Pericardium: Normal  Coronary arteries: Dense diffuse 3 vessel coronary calcium particularly in the entire LAD  IMPRESSION: Coronary calcium score of 770. This was 47 th percentile for age and sex matched control.  Lexiscan Myoview 11/26/2018:  There was no  ST segment deviation noted during stress.  Defect 1: There is a medium defect of moderate severity present in the mid inferoseptal, mid inferior, apical inferior and apical lateral location. This is due to soft tissue attenuation (artifact).  The study is normal. No myocardial ischemia or scar.  This is a low risk study.  Nuclear stress EF: 58%.  Assessment and Plan:  1.  Elevated coronary calcium score greater than 400, asymptomatic and with Lexiscan Myoview from last year showing no definite ischemic territories with normal LVEF.  ECG is normal today.  No interval development of exertional symptoms since last visit.  Continue aspirin and Crestor which will increase to 20 mg daily.  2.  Carotid bruit, check screening carotid Dopplers.  3.  Obesity, we have discussed diet and weight loss.  4.  Essential hypertension, continue Altace and Norvasc with follow-up by Dr. Willey Blade.  Medication Adjustments/Labs and Tests Ordered: Current medicines are reviewed at length with the patient today.  Concerns regarding medicines are outlined above.   Tests Ordered: Orders Placed This Encounter  Procedures  . US Carotid Duplex Bilateral  . EKG 12-Lead    Medication Changes: Meds ordered this encounter  Medications  . rosuvastatin (CRESTOR) 20 MG tablet    Sig: Take 1 tablet (20 mg total) by mouth daily.    Dispense:  90 tablet    Refill:  3    Disposition:  Follow up 1 year in the [place] office.  Signed, Satira Sark, Jesus Ramos, [facility] 09/10/2019 4:22 PM    [facility] at [facility] [address] [place], [place] [address] Phone: [phone]; Fax: [phone]

## 2019-09-10 NOTE — Patient Instructions (Signed)
Medication Instructions:   Increase Crestor to 20mg  daily.   Labwork:  Per PCP for routine labs.   Testing/Procedures:  None  Follow-Up:  1 year with Dr. Domenic Polite  Any Other Special Instructions Will Be Listed Below (If Applicable).     If you need a refill on your cardiac medications before your next appointment, please call your pharmacy.

## 2019-09-13 DIAGNOSIS — G4733 Obstructive sleep apnea (adult) (pediatric): Secondary | ICD-10-CM | POA: Diagnosis not present

## 2019-09-18 DIAGNOSIS — H02834 Dermatochalasis of left upper eyelid: Secondary | ICD-10-CM | POA: Diagnosis not present

## 2019-09-18 DIAGNOSIS — H2513 Age-related nuclear cataract, bilateral: Secondary | ICD-10-CM | POA: Diagnosis not present

## 2019-09-18 DIAGNOSIS — H02831 Dermatochalasis of right upper eyelid: Secondary | ICD-10-CM | POA: Diagnosis not present

## 2019-09-18 DIAGNOSIS — H53002 Unspecified amblyopia, left eye: Secondary | ICD-10-CM | POA: Diagnosis not present

## 2019-09-20 DIAGNOSIS — G4733 Obstructive sleep apnea (adult) (pediatric): Secondary | ICD-10-CM | POA: Diagnosis not present

## 2019-10-02 ENCOUNTER — Other Ambulatory Visit: Payer: Self-pay

## 2019-10-02 ENCOUNTER — Ambulatory Visit (HOSPITAL_COMMUNITY)
Admission: RE | Admit: 2019-10-02 | Discharge: 2019-10-02 | Disposition: A | Discharge status: Home / Self Care | Payer: 59 | Source: Ambulatory Visit | Attending: Cardiology | Admitting: Cardiology

## 2019-10-02 DIAGNOSIS — I6523 Occlusion and stenosis of bilateral carotid arteries: Secondary | ICD-10-CM | POA: Diagnosis not present

## 2019-10-02 DIAGNOSIS — R0989 Other specified symptoms and signs involving the circulatory and respiratory systems: Secondary | ICD-10-CM | POA: Insufficient documentation

## 2019-10-20 DIAGNOSIS — G4733 Obstructive sleep apnea (adult) (pediatric): Secondary | ICD-10-CM | POA: Diagnosis not present

## 2019-11-13 ENCOUNTER — Other Ambulatory Visit: Payer: Self-pay | Admitting: Pharmacist

## 2019-11-14 ENCOUNTER — Other Ambulatory Visit: Payer: Self-pay | Admitting: Pharmacist

## 2019-11-14 MED ORDER — SKYRIZI (150 MG DOSE) 75 MG/0.83ML ~~LOC~~ PSKT: 2.0000 | PREFILLED_SYRINGE | SUBCUTANEOUS | 2 refills | Status: DC

## 2019-12-03 DIAGNOSIS — I2510 Atherosclerotic heart disease of native coronary artery without angina pectoris: Secondary | ICD-10-CM | POA: Diagnosis not present

## 2019-12-03 DIAGNOSIS — I10 Essential (primary) hypertension: Secondary | ICD-10-CM | POA: Diagnosis not present

## 2019-12-03 DIAGNOSIS — G4733 Obstructive sleep apnea (adult) (pediatric): Secondary | ICD-10-CM | POA: Diagnosis not present

## 2019-12-03 DIAGNOSIS — Z0001 Encounter for general adult medical examination with abnormal findings: Secondary | ICD-10-CM | POA: Diagnosis not present

## 2020-01-03 ENCOUNTER — Other Ambulatory Visit (HOSPITAL_COMMUNITY): Payer: Self-pay | Admitting: Internal Medicine

## 2020-01-03 DIAGNOSIS — I10 Essential (primary) hypertension: Secondary | ICD-10-CM | POA: Diagnosis not present

## 2020-01-31 DIAGNOSIS — I10 Essential (primary) hypertension: Secondary | ICD-10-CM | POA: Diagnosis not present

## 2020-02-10 ENCOUNTER — Other Ambulatory Visit: Payer: Self-pay | Admitting: Pharmacist

## 2020-02-10 MED ORDER — SKYRIZI 150 MG/ML ~~LOC~~ SOSY: 150.0000 mg | PREFILLED_SYRINGE | SUBCUTANEOUS | 3 refills | Status: DC

## 2020-02-19 ENCOUNTER — Other Ambulatory Visit: Payer: Self-pay

## 2020-02-19 ENCOUNTER — Ambulatory Visit (HOSPITAL_BASED_OUTPATIENT_CLINIC_OR_DEPARTMENT_OTHER): Payer: 59 | Admitting: Pharmacist

## 2020-02-19 DIAGNOSIS — Z79899 Other long term (current) drug therapy: Secondary | ICD-10-CM

## 2020-02-19 NOTE — Progress Notes (Signed)
° °  S: Patient presents for review of their specialty medication therapy.  Patient is currently taking Skyrizi for psoriasis. Patient is managed by Dr. Nevada Crane for this.   Adherence: confirms  Efficacy: pt recently changed (~10 days ago) to a 150 mg x1 dose vs two, 75 mg injections previously. He reports that this is going well so far.     Dosing: Plaque psoriasis, moderate to severe: SubQ: total dose of 150 mg every 12 weeks  Screening: TB test: completed per patient  Monitoring: S/sx of infection: denies S/sx of hypersensitivity/injection site reaction: denies   O:  Lab Results  Component Value Date   WBC 12.1 (H) 05/15/2015   HGB 14.0 05/15/2015   HCT 41.7 05/15/2015   MCV 87.8 05/15/2015   PLT 262 05/15/2015     Chemistry      Component Value Date/Time   NA 138 05/15/2015 2345   K 3.7 05/15/2015 2345   CL 106 05/15/2015 2345   CO2 22 05/15/2015 2345   BUN 21 (H) 05/15/2015 2345   CREATININE 0.95 05/15/2015 2345      Component Value Date/Time   CALCIUM 8.9 05/15/2015 2345   ALKPHOS 60 11/16/2018 0737   AST 19 11/16/2018 0737   ALT 21 11/16/2018 0737   BILITOT 0.8 11/16/2018 0737      A/P: 1. Medication review: Patient currently on [place] for psoriasis. Reviewed the medication with the patient, including the following: Orson Ape is a monoclonal antibody used in the treatment of psoriasis. Patient educated on purpose, proper use and potential adverse effects of Skyrizi. Possible adverse effects are infections, headache, and injection site reactions. Live vaccinations should be avoided while on therapy. No recommendations for any changes.   Benard Halsted, PharmD, [place] [phone]

## 2020-03-23 ENCOUNTER — Telehealth: Payer: Self-pay | Admitting: Pulmonary Disease

## 2020-03-23 DIAGNOSIS — G4733 Obstructive sleep apnea (adult) (pediatric): Secondary | ICD-10-CM

## 2020-03-23 NOTE — Telephone Encounter (Signed)
Called and spoke with patient about sending order in to [facility] for new CPAP mask and supplies. Order placed per Dr Halford Chessman. Scheduled ROV for Tuesday 05/20/2019 at noon at the [place] office with Dr Halford Chessman. Patient agreeable to time, date and location.

## 2020-03-23 NOTE — Telephone Encounter (Signed)
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  Please send order to [facility] to arrange for new CPAP mask and supplies and schedule him for an ROV in [place] office.

## 2020-03-27 DIAGNOSIS — G4733 Obstructive sleep apnea (adult) (pediatric): Secondary | ICD-10-CM | POA: Diagnosis not present

## 2020-04-27 DIAGNOSIS — B351 Tinea unguium: Secondary | ICD-10-CM | POA: Diagnosis not present

## 2020-04-27 DIAGNOSIS — L570 Actinic keratosis: Secondary | ICD-10-CM | POA: Diagnosis not present

## 2020-04-27 DIAGNOSIS — L738 Other specified follicular disorders: Secondary | ICD-10-CM | POA: Diagnosis not present

## 2020-04-27 DIAGNOSIS — L400 Psoriasis vulgaris: Secondary | ICD-10-CM | POA: Diagnosis not present

## 2020-04-27 DIAGNOSIS — B078 Other viral warts: Secondary | ICD-10-CM | POA: Diagnosis not present

## 2020-04-27 DIAGNOSIS — X32XXXD Exposure to sunlight, subsequent encounter: Secondary | ICD-10-CM | POA: Diagnosis not present

## 2020-04-27 DIAGNOSIS — L821 Other seborrheic keratosis: Secondary | ICD-10-CM | POA: Diagnosis not present

## 2020-05-19 ENCOUNTER — Ambulatory Visit: Payer: 59 | Admitting: Pulmonary Disease

## 2020-05-19 ENCOUNTER — Encounter: Payer: Self-pay | Admitting: Pulmonary Disease

## 2020-05-19 ENCOUNTER — Other Ambulatory Visit: Payer: Self-pay

## 2020-05-19 VITALS — BP 136/94 | HR 63 | Temp 97.0°F | Ht 73.5 in | Wt 323.6 lb

## 2020-05-19 DIAGNOSIS — E669 Obesity, unspecified: Secondary | ICD-10-CM

## 2020-05-19 DIAGNOSIS — G4733 Obstructive sleep apnea (adult) (pediatric): Secondary | ICD-10-CM | POA: Diagnosis not present

## 2020-05-19 DIAGNOSIS — G4730 Sleep apnea, unspecified: Secondary | ICD-10-CM | POA: Diagnosis not present

## 2020-05-19 NOTE — Progress Notes (Signed)
[facility]  Chief Complaint  Patient presents with  . Follow-up    No complaints currently    Constitutional:  BP (!) 136/94 (BP Location: Left Arm, Cuff Size: Normal)   Pulse 63   Temp (!) 97 F (36.1 C) (Other (Comment)) Comment (Src): wrist  Ht 6' 1.5" (1.867 m)   Wt (!) 323 lb 9.6 oz (146.8 kg)   SpO2 97% Comment: Room air  BMI 42.11 kg/m   Past Medical History:  HLD, Diverticulitis, Colon Polyp, HTN, coronary atherosclerosis  Past Surgical History:  He  has a past surgical history that includes Tonsillectomy; Eye surgery; Nasal septum surgery; and Colonoscopy (05/23/2011).  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 male with obstructive sleep apnea.      Subjective:   Doing well with CPAP.  Occasional mask leak, but gets better when he adjusts mask.  Feels rested.  Trying to work on his weight.  Physical Exam:   Appearance - well kempt   ENMT - no sinus tenderness, no oral exudate, no LAN, Mallampati 3 airway, no stridor  Respiratory - equal breath sounds bilaterally, no wheezing or rales  CV - s1s2 regular rate and rhythm, no murmurs  Ext - no clubbing, no edema  Skin - no rashes  Psych - normal mood and affect   Sleep Tests:   PSG 10/05/06 >> AHI 20.7, SpO2 low 83%  HST 12/10/18 >> AHI 27.4, SpO2 low 84%  Auto CPAP 04/18/20 to 05/17/20 >> used on 30 of 30 nights with average 9 hrs 8 min.  Average AHI 6.5 with median CPAP 11 and 95 th percentile CPAP 16 cm H2O.  Some air leak.  Cardiac Tests:   Echo 11/26/18 >> EF 60 to 65%, mod LVH  Social History:  He  reports that he has never smoked. He has never used smokeless tobacco. He reports that he does not drink alcohol and does not use drugs.  Family History:  His family history includes Dementia in his mother; Heart disease in his father; Hypertension in his mother.     Time Spent Involved in Patient Care on Day of Examination:   Obstructive  sleep apnea. - he is compliant with CPAP and reports benefit from therapy - he uses [facility] for his DME - continue auto CPAP 5 to 20 cm H2O - he will try adjusting ramp setting; if he stills feels like pressure kicks in too slowly, then can try raising CPAP setting  Obesity. - he will work on his diet and exercise more  Follow up:  Patient Instructions  Follow up in 1 year   Medication List:   Allergies as of 05/19/2020   No Known Allergies     Medication List       Accurate as of May 19, 2020 12:37 PM. If you have any questions, ask your nurse or doctor.        STOP taking these medications   ramipril 10 MG capsule Commonly known as: ALTACE Stopped by: Chesley Mires, MD     TAKE these medications   amLODipine 10 MG tablet Commonly known as: NORVASC Take 10 mg by mouth daily. What changed: Another medication with the same name was removed. Continue taking this medication, and follow the directions you see here. Changed by: Chesley Mires, MD   aspirin 81 MG tablet Take 162 mg by mouth daily.   escitalopram 20 MG tablet Commonly known as: LEXAPRO  Take 20 mg by mouth daily.   rosuvastatin 20 MG tablet Commonly known as: CRESTOR Take 1 tablet (20 mg total) by mouth daily.   Skyrizi 150 MG/ML Sosy Generic drug: Risankizumab-rzaa Inject 150 mg into the skin as directed. Inject 1 syringe into the skin every 12 weeks.   valsartan 160 MG tablet Commonly known as: DIOVAN Take 160 mg by mouth daily.       Signature:  Chesley Mires, MD [place] Pager - [phone] 05/19/2020, 12:37 PM

## 2020-05-19 NOTE — Patient Instructions (Signed)
Follow up in 1 year.

## 2020-07-01 IMAGING — CT CT RENAL STONE PROTOCOL
2 of 4 series · 16 of 46 positions shown, 18 images · non-contrast
Comparison: 08/04/2014

CLINICAL DATA: Flank and suprapubic pain for 2 days.

EXAM:
CT ABDOMEN AND PELVIS WITHOUT CONTRAST
TECHNIQUE: Multidetector CT imaging of the abdomen and pelvis was performed
following the standard protocol without IV contrast.

[Series 2: axial st · axial · 0.93mm/px · z∈[-517,-47]mm · 13 of 104 slices shown, 15 images]
[im 5/104  soft-tissue]
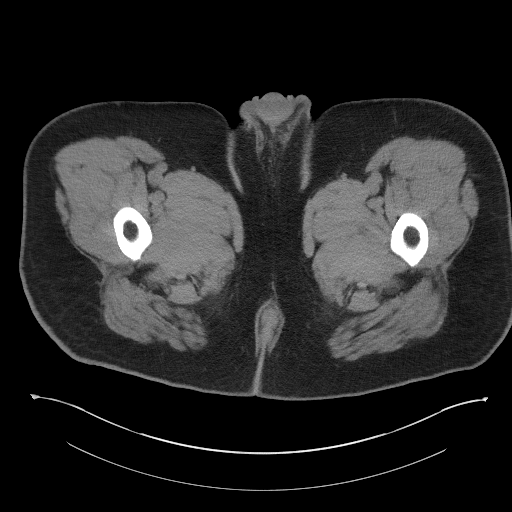
[im 5/104  bone]
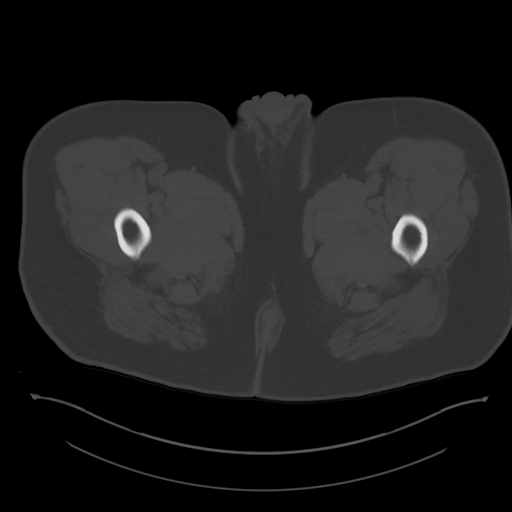
[im 15/104  soft-tissue]
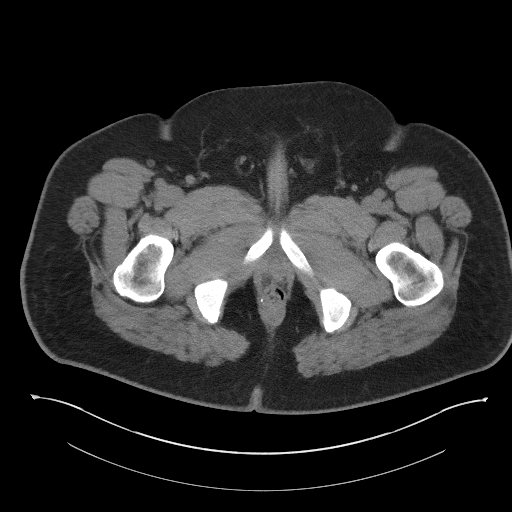
[im 24/104  soft-tissue]
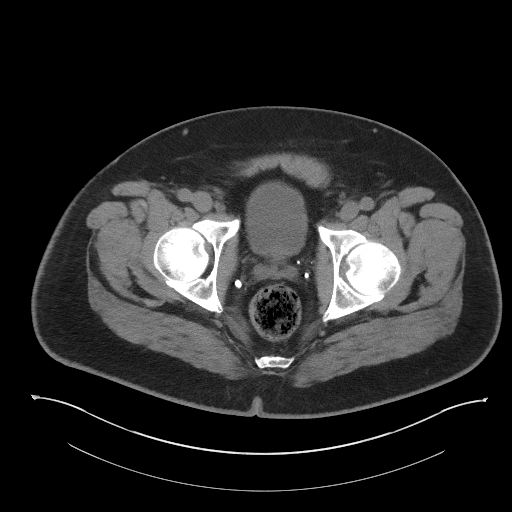
[im 29/104  soft-tissue]
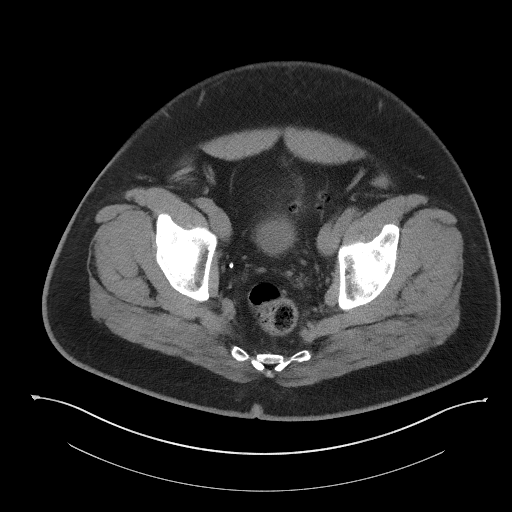
[im 38/104  soft-tissue]
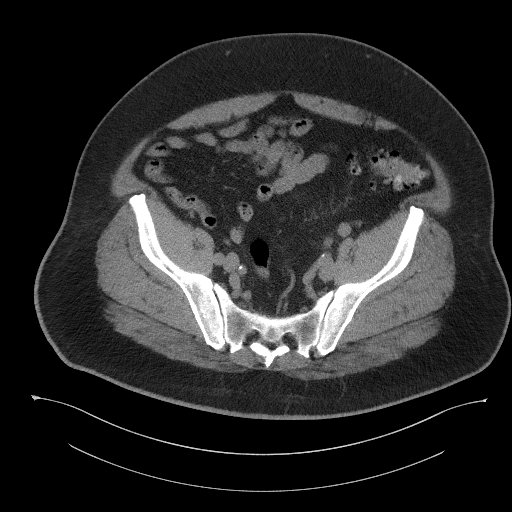
[im 43/104  soft-tissue]
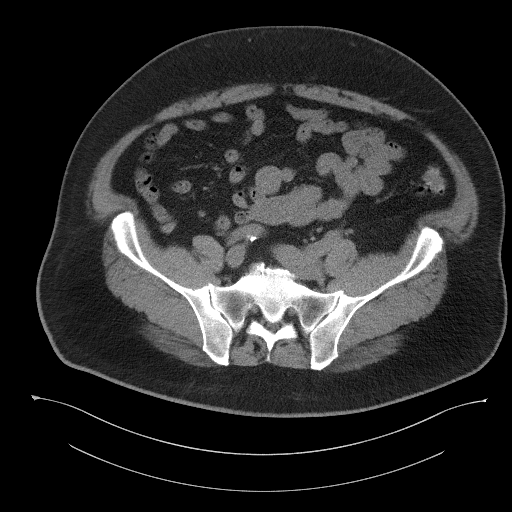
[im 52/104  soft-tissue]
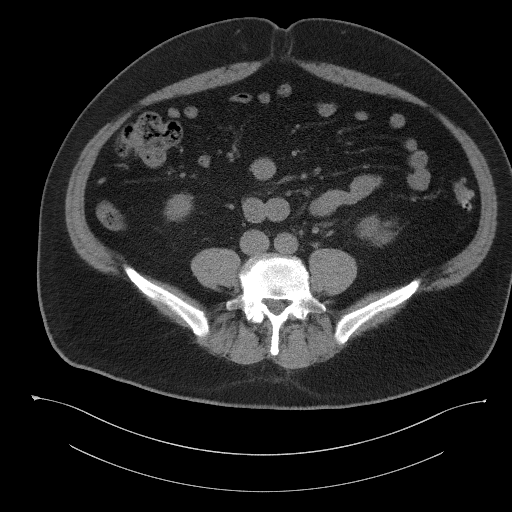
[im 61/104  soft-tissue]
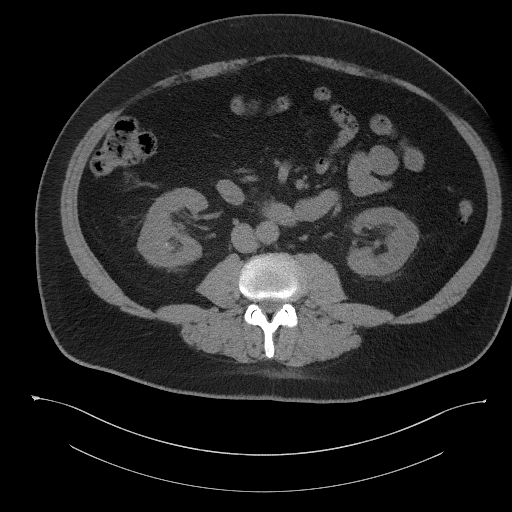
[im 66/104  soft-tissue]
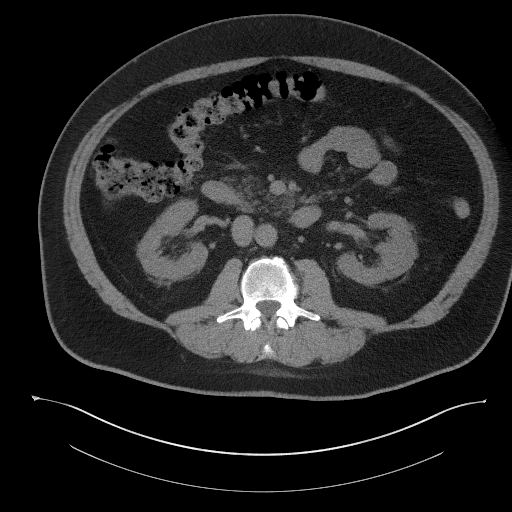
[im 66/104  bone]
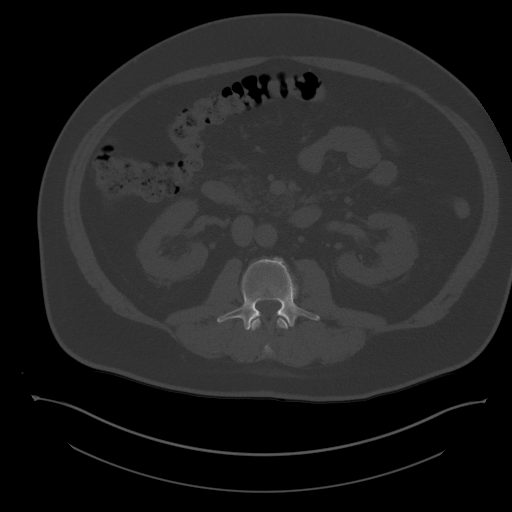
[im 75/104  soft-tissue]
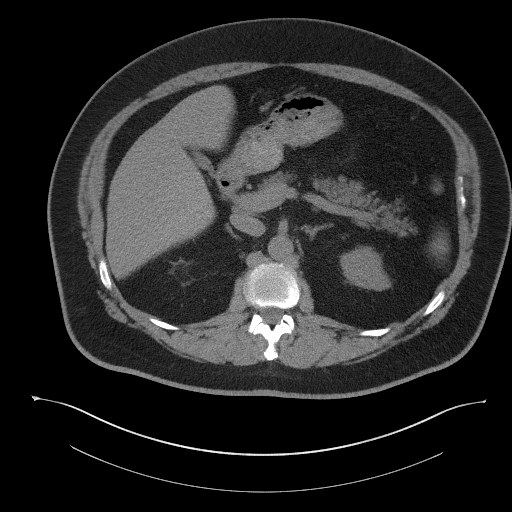
[im 80/104  soft-tissue]
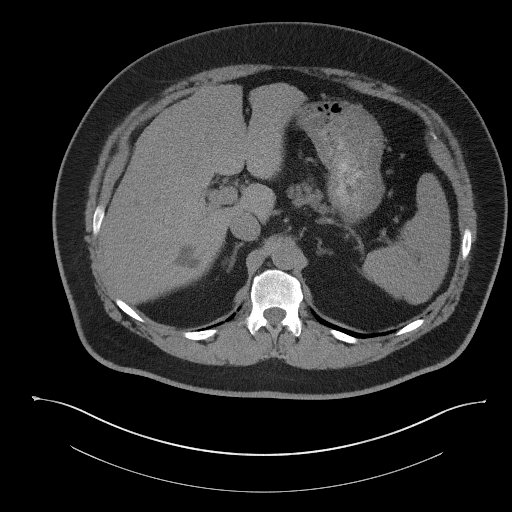
[im 89/104  soft-tissue]
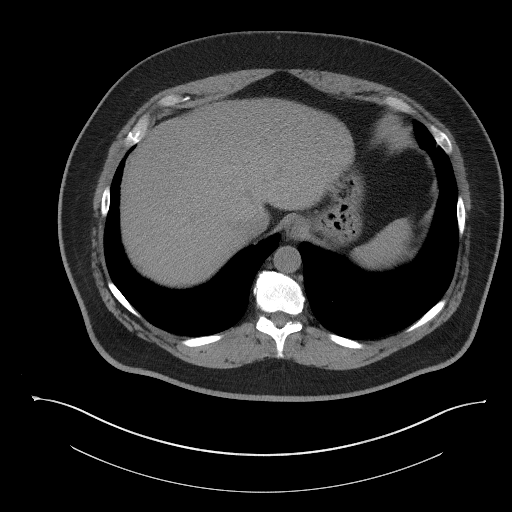
[im 99/104  soft-tissue]
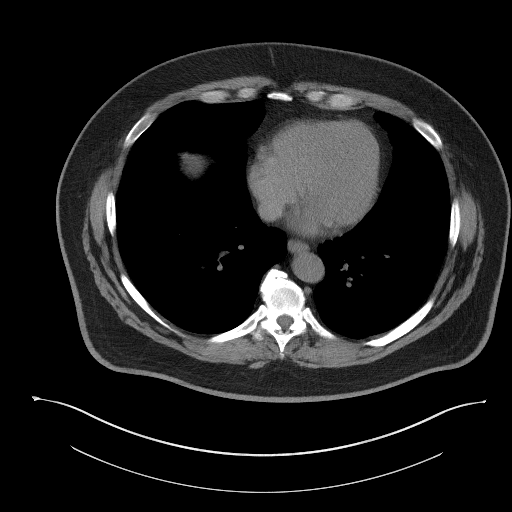

[Series 5: coronal st · coronal · 0.87mm/px · 3 of 106 slices shown]
[im 36/106  soft-tissue]
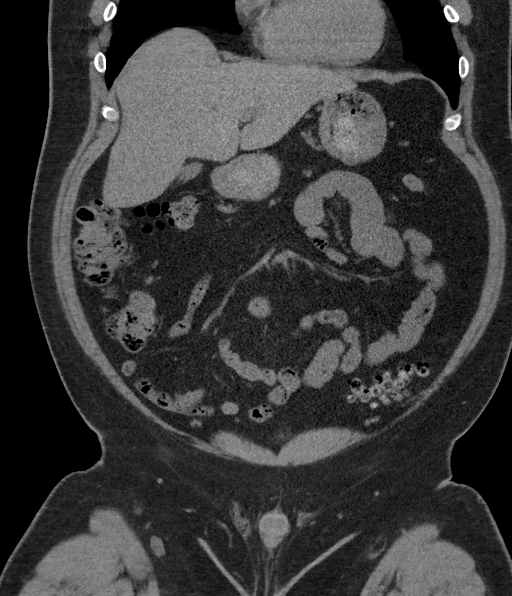
[im 47/106  soft-tissue]
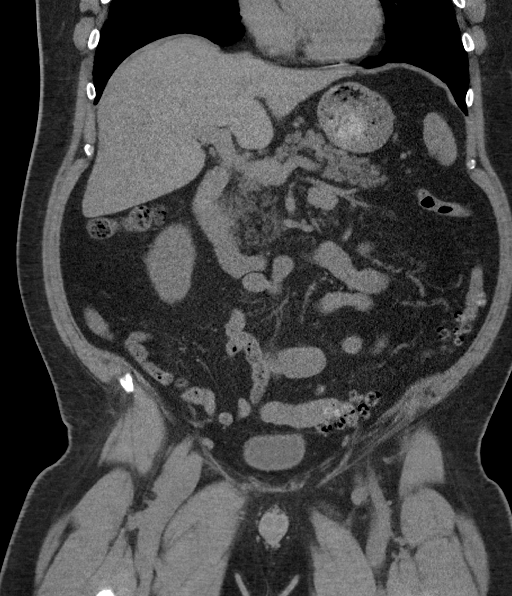
[im 59/106  soft-tissue]
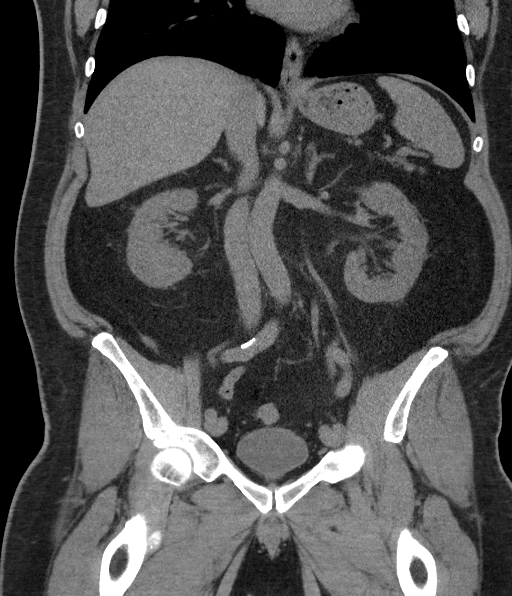

[16 of 46 positions shown; findings below may reference images not displayed]

FINDINGS: Lower chest: Right coronary artery atherosclerotic calcification.

Hepatobiliary: Hypodense lesion in the right hepatic lobe 2.5 by
cm on image [DATE], internal density 3 Hounsfield units, most likely a
cyst, and not appreciably changed from 1024. Nonspecific 4 mm
hypodense lesion peripherally in the right hepatic lobe on image
[DATE]. Contracted gallbladder.

Pancreas: Unremarkable

Spleen: Unremarkable

Adrenals/Urinary Tract: Both adrenal glands appear normal.

2 mm left UVJ calculus associated with borderline left hydroureter.

1 mm right mid kidney nonobstructive renal calculus, image 63/5.

Stomach/Bowel: Descending and sigmoid colon diverticulosis.
Suspected mild acute sigmoid diverticulitis given the adjacent
mesenteric stranding on image 52/5.

Vascular/Lymphatic: Iliac artery atherosclerotic calcification.

Reproductive: Upper normal prostate size.

Other: No supplemental non-categorized findings.

Musculoskeletal: Degenerative disc disease at L2-3, L4-5, and L5-S1,
with mild right foraminal impingement at L5-S1 due to spurring, and
potential right subarticular lateral recess stenosis at L4-5 due to
disc protrusion. Small umbilical hernia contains adipose tissue.
IMPRESSION: 1. 3mm left UVJ calculus associated with borderline left
hydroureter. No overt hydronephrosis. A similar appearance was
present on 08/04/2014, although I would favor that these are
different calculi rather than a chronic calculus.
2. 1 mm right mid kidney nonobstructive renal calculus.
3. Descending and sigmoid colon diverticulosis with mild sigmoid
colon diverticulitis. No extraluminal gas or abscess.
4. Potential right-sided impingement at L4-5 and L5-S1 due to
spondylosis and degenerative disc disease.
5. Right coronary artery atherosclerosis.
6. Small hypodense lesions in the right hepatic lobe are likely
cysts.

## 2020-07-13 ENCOUNTER — Other Ambulatory Visit (HOSPITAL_COMMUNITY): Payer: Self-pay | Admitting: Internal Medicine

## 2020-07-16 ENCOUNTER — Other Ambulatory Visit (HOSPITAL_COMMUNITY): Payer: Self-pay

## 2020-07-28 ENCOUNTER — Other Ambulatory Visit (HOSPITAL_COMMUNITY): Payer: Self-pay

## 2020-07-30 ENCOUNTER — Other Ambulatory Visit (HOSPITAL_COMMUNITY): Payer: Self-pay

## 2020-07-30 MED FILL — Risankizumab-rzaa Soln Prefilled Syringe 150 MG/ML: SUBCUTANEOUS | 84 days supply | Qty: 1 | Fill #0 | Status: AC

## 2020-07-31 ENCOUNTER — Other Ambulatory Visit (HOSPITAL_COMMUNITY): Payer: Self-pay

## 2020-08-04 ENCOUNTER — Other Ambulatory Visit (HOSPITAL_COMMUNITY): Payer: Self-pay

## 2020-08-08 MED FILL — Rosuvastatin Calcium Tab 20 MG: ORAL | 90 days supply | Qty: 90 | Fill #0 | Status: AC

## 2020-08-10 ENCOUNTER — Other Ambulatory Visit (HOSPITAL_COMMUNITY): Payer: Self-pay

## 2020-08-17 ENCOUNTER — Other Ambulatory Visit (HOSPITAL_COMMUNITY): Payer: Self-pay

## 2020-09-10 DIAGNOSIS — G4733 Obstructive sleep apnea (adult) (pediatric): Secondary | ICD-10-CM | POA: Diagnosis not present

## 2020-09-16 ENCOUNTER — Other Ambulatory Visit (HOSPITAL_COMMUNITY): Payer: Self-pay

## 2020-09-16 MED ORDER — AMLODIPINE BESYLATE 10 MG PO TABS
10.0000 mg | ORAL_TABLET | Freq: Every day | ORAL | 3 refills | Status: DC
  Filled 2020-09-16: qty 90, 90d supply, fill #0

## 2020-09-16 MED FILL — Amlodipine Besylate Tab 5 MG (Base Equivalent): ORAL | 90 days supply | Qty: 90 | Fill #0 | Status: CN

## 2020-09-16 MED FILL — Valsartan Tab 160 MG: ORAL | 90 days supply | Qty: 90 | Fill #0 | Status: AC

## 2020-09-17 ENCOUNTER — Other Ambulatory Visit (HOSPITAL_COMMUNITY): Payer: Self-pay

## 2020-09-17 MED ORDER — ESCITALOPRAM OXALATE 20 MG PO TABS
1.0000 | ORAL_TABLET | Freq: Every day | ORAL | 4 refills | Status: DC
  Filled 2020-09-17: qty 90, 90d supply, fill #0
  Filled 2020-12-27: qty 90, 90d supply, fill #1
  Filled 2020-12-28: qty 90, 90d supply, fill #0
  Filled 2021-03-31: qty 90, 90d supply, fill #1
  Filled 2021-07-22 (×2): qty 90, 90d supply, fill #2

## 2020-09-21 ENCOUNTER — Other Ambulatory Visit (HOSPITAL_COMMUNITY): Payer: Self-pay

## 2020-09-22 ENCOUNTER — Other Ambulatory Visit (HOSPITAL_COMMUNITY): Payer: Self-pay

## 2020-10-02 ENCOUNTER — Other Ambulatory Visit (HOSPITAL_COMMUNITY): Payer: Self-pay

## 2020-10-14 DIAGNOSIS — G4733 Obstructive sleep apnea (adult) (pediatric): Secondary | ICD-10-CM | POA: Diagnosis not present

## 2020-10-21 ENCOUNTER — Other Ambulatory Visit (HOSPITAL_COMMUNITY): Payer: Self-pay

## 2020-10-23 ENCOUNTER — Other Ambulatory Visit (HOSPITAL_COMMUNITY): Payer: Self-pay

## 2020-10-23 MED FILL — Risankizumab-rzaa Soln Prefilled Syringe 150 MG/ML: SUBCUTANEOUS | 84 days supply | Qty: 1 | Fill #1 | Status: AC

## 2020-10-26 ENCOUNTER — Other Ambulatory Visit (HOSPITAL_COMMUNITY): Payer: Self-pay

## 2020-11-04 ENCOUNTER — Encounter: Payer: Self-pay | Admitting: Cardiology

## 2020-11-04 NOTE — Progress Notes (Signed)
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  He is here for a routine visit, continues to do well without any angina symptoms or exercise intolerance.  He has been trying to increase his walking, has gone from 1 mile to 3 miles at a time.  Weight loss continues to be a challenge for him.  I reviewed his medications which are noted below.  He reports compliance and no obvious intolerances.  Follow-up lab work is also noted below.  LDL 53.  Hemoglobin A1c is up to 6.4%.  Follow-up noted with Dr. Halford Chessman in February, history of OSA on CPAP.  I personally reviewed his ECG today which shows normal sinus rhythm.  Past Medical History:  Diagnosis Date   Elevated coronary artery calcium score    Essential hypertension    History of colonic polyps    History of diverticulitis    Hyperlipidemia    Impaired fasting glucose    OSA on CPAP     Past Surgical History:  Procedure Laterality Date   COLONOSCOPY  05/23/2011   Procedure: COLONOSCOPY;  Surgeon: Owens Loffler, MD;  Location: [facility];  Service: Endoscopy;  Laterality: N/A;   EYE SURGERY     NASAL SEPTUM SURGERY     TONSILLECTOMY      Current Outpatient Medications  Medication Sig Dispense Refill   amLODipine (NORVASC) 10 MG tablet Take 1 tablet (10 mg total) by mouth daily. 90 tablet 3   aspirin 81 MG tablet Take 162 mg by mouth daily.      escitalopram (LEXAPRO) 20 MG tablet Take 20 mg by mouth daily.     Risankizumab-rzaa 150 MG/ML SOSY Inject into the skin.     rosuvastatin (CRESTOR) 20 MG tablet Take 1 tablet (20 mg total) by mouth daily. 90 tablet 3   valsartan (DIOVAN) 160 MG tablet Take 160 mg by mouth  daily.     amLODipine (NORVASC) 10 MG tablet Take 10 mg by mouth daily. (Patient not taking: Reported on 11/05/2020)     amLODipine (NORVASC) 5 MG tablet TAKE 1 TABLET BY MOUTH ONCE DAILY (Patient not taking: Reported on 11/05/2020) 90 tablet 4   escitalopram (LEXAPRO) 20 MG tablet TAKE 1 TABLET BY MOUTH ONCE DAILY (Patient not taking: Reported on 11/05/2020) 90 tablet 4   escitalopram (LEXAPRO) 20 MG tablet TAKE 1 TABLET BY MOUTH ONCE DAILY (Patient not taking: Reported on 11/05/2020) 90 tablet 4   rosuvastatin (CRESTOR) 20 MG tablet TAKE 1 TABLET BY MOUTH DAILY (Patient not taking: Reported on 11/05/2020) 90 tablet 3   valsartan (DIOVAN) 160 MG tablet TAKE 1 TABLET BY MOUTH DAILY * STOP RAMIPRIL (Patient not taking: Reported on 11/05/2020) 90 tablet 4   Current Facility-Administered Medications  Medication Dose Route Frequency Provider Last Rate Last Admin   0.9 %  sodium chloride infusion  500 mL Intravenous Continuous Milus Banister, MD       0.9 %  sodium chloride infusion  500 mL Intravenous Continuous Milus Banister, MD       Allergies:  Patient has no known allergies.   ROS: No syncope.  Physical Exam: VS:  BP (!) 158/76   Pulse 68   Ht 6' 1.5" (1.867 m)   Wt (!) 312 lb (141.5 kg)   SpO2 97%   BMI 40.61 kg/m , BMI Body mass index is 40.61 kg/m.  Wt Readings from Last 3 Encounters:  11/05/20 (!) 312 lb (141.5 kg)  05/19/20 (!) 323 lb 9.6 oz (146.8 kg)  09/10/19 (!) 316 lb (143.3 kg)    General: Patient appears comfortable at rest. HEENT: Conjunctiva and lids normal, wearing a mask. Neck: Supple, no elevated JVP or carotid bruits, no thyromegaly. Lungs: Clear to auscultation, nonlabored breathing at rest. Cardiac: Regular rate and rhythm, no S3 or significant systolic murmur, no pericardial rub. Abdomen: Soft, bowel sounds present, no bruits. Extremities: No pitting edema.  ECG:  An ECG dated 09/10/2019 was personally reviewed today and demonstrated:  Sinus  rhythm.  Recent Labwork:  April 2022: BUN 14, creatinine 0.92, potassium 4.6, AST 18, ALT 18, cholesterol 118, triglycerides 149, HDL 39, LDL 53, TSH 3.82, hemoglobin 14.7, platelets 290, hemoglobin A1c 6.4%  Other Studies Reviewed Today:  Coronary calcium score 10/31/2018: FINDINGS: Non-cardiac: See separate report from [facility]iology.   Ascending aorta: Mildly dilated 3.9 cm   Pericardium: Normal   Coronary arteries: Dense diffuse 3 vessel coronary calcium particularly in the entire LAD   IMPRESSION: Coronary calcium score of 770. This was 67 th percentile for age and sex matched control.   Lexiscan Myoview 11/26/2018: There was no ST segment deviation noted during stress. Defect 1: There is a medium defect of moderate severity present in the mid inferoseptal, mid inferior, apical inferior and apical lateral location. This is due to soft tissue attenuation (artifact). The study is normal. No myocardial ischemia or scar. This is a low risk study. Nuclear stress EF: 58%.  Carotid Dopplers 10/02/2019: IMPRESSION: Minor carotid atherosclerosis. No hemodynamically significant ICA stenosis. Degree of narrowing less than 50% bilaterally by ultrasound criteria.   Patent antegrade vertebral flow bilaterally  Assessment and Plan:  1.  Increased coronary calcium score greater than 400.  ECG is normal today and he remains asymptomatic, no angina or exercise intolerance.  Ischemic testing from 2020 was low risk and LVEF is known to be normal.  Continue risk factor modification.  He is on aspirin, Crestor, Norvasc, and Diovan.  We discussed exercise regimen and weight loss.  2.  LDL 53 on Crestor.  3.  Essential hypertension, continue current regimen including Norvasc and Diovan.  Weight loss would also be beneficial.  He continues to see Dr. Willey Blade.  4.  OSA on CPAP, following with Dr. Halford Chessman.   Medication Adjustments/Labs and Tests Ordered: Current medicines are reviewed at  length with the patient today.  Concerns regarding medicines are outlined above.   Tests Ordered: No orders of the defined types were placed in this encounter.   Medication Changes: No orders of the defined types were placed in this encounter.   Disposition:  Follow up  1 year, sooner if needed.  Signed, Satira Sark, MD, [facility] 11/05/2020 8:48 AM    [facility] at [facility] [address], [place], [place] [address] Phone: [phone]; Fax: [phone]

## 2020-11-05 ENCOUNTER — Ambulatory Visit: Payer: 59 | Admitting: Cardiology

## 2020-11-05 ENCOUNTER — Encounter: Payer: Self-pay | Admitting: Cardiology

## 2020-11-05 ENCOUNTER — Other Ambulatory Visit: Payer: Self-pay

## 2020-11-05 VITALS — BP 158/76 | HR 68 | Ht 73.5 in | Wt 312.0 lb

## 2020-11-05 DIAGNOSIS — G4733 Obstructive sleep apnea (adult) (pediatric): Secondary | ICD-10-CM | POA: Diagnosis not present

## 2020-11-05 DIAGNOSIS — Z9989 Dependence on other enabling machines and devices: Secondary | ICD-10-CM

## 2020-11-05 DIAGNOSIS — R931 Abnormal findings on diagnostic imaging of heart and coronary circulation: Secondary | ICD-10-CM

## 2020-11-05 DIAGNOSIS — I10 Essential (primary) hypertension: Secondary | ICD-10-CM

## 2020-11-05 NOTE — Patient Instructions (Signed)
Medication Instructions:   Your physician recommends that you continue on your current medications as directed. Please refer to the Current Medication list given to you today.  *If you need a refill on your cardiac medications before your next appointment, please call your pharmacy*   Lab Work:  None today  If you have labs (blood work) drawn today and your tests are completely normal, you will receive your results only by: [place] (if you have MyChart) OR A paper copy in the mail If you have any lab test that is abnormal or we need to change your treatment, we will call you to review the results.   Testing/Procedures: None today    Follow-Up: At [facility], you and your health needs are our priority.  As part of our continuing mission to provide you with exceptional heart care, we have created designated Provider Care Teams.  These Care Teams include your primary Cardiologist (physician) and Advanced Practice Providers (APPs -  Physician Assistants and Nurse Practitioners) who all work together to provide you with the care you need, when you need it.  We recommend signing up for the patient portal called "MyChart".  Sign up information is provided on this After Visit Summary.  MyChart is used to connect with patients for Virtual Visits (Telemedicine).  Patients are able to view lab/test results, encounter notes, upcoming appointments, etc.  Non-urgent messages can be sent to your provider as well.   To learn more about what you can do with MyChart, go to [email or web address].    Your next appointment:   1 year(s)  The format for your next appointment:   In Person  Provider:   Rozann Lesches, MD   Other Instructions None        Thank you for choosing [facility] !

## 2020-11-05 NOTE — Addendum Note (Signed)
Addended by: Barbarann Ehlers A on: 11/05/2020 03:30 PM   Modules accepted: Orders

## 2020-12-02 DIAGNOSIS — H02834 Dermatochalasis of left upper eyelid: Secondary | ICD-10-CM | POA: Diagnosis not present

## 2020-12-02 DIAGNOSIS — H02831 Dermatochalasis of right upper eyelid: Secondary | ICD-10-CM | POA: Diagnosis not present

## 2020-12-02 DIAGNOSIS — H2513 Age-related nuclear cataract, bilateral: Secondary | ICD-10-CM | POA: Diagnosis not present

## 2020-12-02 DIAGNOSIS — H53002 Unspecified amblyopia, left eye: Secondary | ICD-10-CM | POA: Diagnosis not present

## 2020-12-02 DIAGNOSIS — H10829 Rosacea conjunctivitis, unspecified eye: Secondary | ICD-10-CM | POA: Diagnosis not present

## 2020-12-04 DIAGNOSIS — Z0001 Encounter for general adult medical examination with abnormal findings: Secondary | ICD-10-CM | POA: Diagnosis not present

## 2020-12-04 DIAGNOSIS — E785 Hyperlipidemia, unspecified: Secondary | ICD-10-CM | POA: Diagnosis not present

## 2020-12-04 DIAGNOSIS — R7309 Other abnormal glucose: Secondary | ICD-10-CM | POA: Diagnosis not present

## 2020-12-04 DIAGNOSIS — I2510 Atherosclerotic heart disease of native coronary artery without angina pectoris: Secondary | ICD-10-CM | POA: Diagnosis not present

## 2020-12-04 DIAGNOSIS — Z6841 Body Mass Index (BMI) 40.0 and over, adult: Secondary | ICD-10-CM | POA: Diagnosis not present

## 2020-12-04 DIAGNOSIS — I10 Essential (primary) hypertension: Secondary | ICD-10-CM | POA: Diagnosis not present

## 2020-12-04 DIAGNOSIS — G4733 Obstructive sleep apnea (adult) (pediatric): Secondary | ICD-10-CM | POA: Diagnosis not present

## 2020-12-06 ENCOUNTER — Other Ambulatory Visit: Payer: Self-pay | Admitting: Cardiology

## 2020-12-07 ENCOUNTER — Other Ambulatory Visit (HOSPITAL_COMMUNITY): Payer: Self-pay

## 2020-12-07 MED ORDER — ROSUVASTATIN CALCIUM 20 MG PO TABS
20.0000 mg | ORAL_TABLET | Freq: Every day | ORAL | 3 refills | Status: DC
  Filled 2020-12-07: qty 90, 90d supply, fill #0
  Filled 2021-03-31: qty 90, 90d supply, fill #1
  Filled 2021-07-22: qty 90, 90d supply, fill #2
  Filled 2021-10-31: qty 90, 90d supply, fill #3

## 2020-12-27 ENCOUNTER — Other Ambulatory Visit (HOSPITAL_COMMUNITY): Payer: Self-pay

## 2020-12-27 MED FILL — Valsartan Tab 160 MG: ORAL | 90 days supply | Qty: 90 | Fill #1 | Status: CN

## 2020-12-28 ENCOUNTER — Other Ambulatory Visit (HOSPITAL_COMMUNITY): Payer: Self-pay

## 2020-12-28 MED ORDER — AMLODIPINE BESYLATE 10 MG PO TABS
10.0000 mg | ORAL_TABLET | Freq: Every day | ORAL | 4 refills | Status: DC
  Filled 2020-12-28: qty 90, 90d supply, fill #0
  Filled 2021-03-31: qty 90, 90d supply, fill #1
  Filled 2021-07-22 (×2): qty 90, 90d supply, fill #2
  Filled 2021-11-03: qty 90, 90d supply, fill #3

## 2020-12-28 MED FILL — Valsartan Tab 160 MG: ORAL | 90 days supply | Qty: 90 | Fill #0 | Status: AC

## 2021-01-01 ENCOUNTER — Telehealth: Payer: Self-pay | Admitting: Gastroenterology

## 2021-01-01 DIAGNOSIS — Z1211 Encounter for screening for malignant neoplasm of colon: Secondary | ICD-10-CM

## 2021-01-01 NOTE — Telephone Encounter (Signed)
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  I've sent him some dates that I am in [facility], can you reach out to him to help coordinate.  Thanks

## 2021-01-06 NOTE — Telephone Encounter (Signed)
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 Thank you

## 2021-01-07 ENCOUNTER — Other Ambulatory Visit (HOSPITAL_COMMUNITY): Payer: Self-pay

## 2021-01-07 MED ORDER — PEG 3350-KCL-NA BICARB-NACL 420 G PO SOLR
4000.0000 mL | Freq: Once | ORAL | 0 refills | Status: AC
  Filled 2021-01-07: qty 4000, 1d supply, fill #0

## 2021-01-07 NOTE — Telephone Encounter (Signed)
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  Is his BMI ok for the [place]?

## 2021-01-08 NOTE — Telephone Encounter (Signed)
Sorry his BMI of 40.6 is within limits.  I was thinking it was 40 and under.

## 2021-01-12 ENCOUNTER — Other Ambulatory Visit (HOSPITAL_COMMUNITY): Payer: Self-pay

## 2021-01-12 ENCOUNTER — Other Ambulatory Visit: Payer: Self-pay | Admitting: Pharmacist

## 2021-01-12 ENCOUNTER — Other Ambulatory Visit: Payer: Self-pay | Admitting: Internal Medicine

## 2021-01-12 MED ORDER — SKYRIZI 150 MG/ML ~~LOC~~ SOSY
150.0000 mg | PREFILLED_SYRINGE | SUBCUTANEOUS | 3 refills | Status: DC
  Filled 2021-01-12: qty 1, 84d supply, fill #0
  Filled 2021-04-05 – 2021-04-14 (×4): qty 1, 84d supply, fill #1
  Filled 2021-06-24 – 2021-06-28 (×3): qty 1, 84d supply, fill #2
  Filled 2021-09-17: qty 1, 84d supply, fill #3

## 2021-01-12 MED ORDER — SKYRIZI 150 MG/ML ~~LOC~~ SOSY
150.0000 mg | PREFILLED_SYRINGE | SUBCUTANEOUS | 3 refills | Status: DC
  Filled 2021-01-12: qty 1, 84d supply, fill #0

## 2021-01-14 ENCOUNTER — Other Ambulatory Visit (HOSPITAL_COMMUNITY): Payer: Self-pay

## 2021-01-15 ENCOUNTER — Other Ambulatory Visit (HOSPITAL_COMMUNITY): Payer: Self-pay

## 2021-01-19 ENCOUNTER — Other Ambulatory Visit (HOSPITAL_COMMUNITY): Payer: Self-pay

## 2021-01-19 DIAGNOSIS — N402 Nodular prostate without lower urinary tract symptoms: Secondary | ICD-10-CM | POA: Diagnosis not present

## 2021-01-19 MED ORDER — LEVOFLOXACIN 750 MG PO TABS
750.0000 mg | ORAL_TABLET | Freq: Every morning | ORAL | 0 refills | Status: DC
  Filled 2021-01-19: qty 1, 1d supply, fill #0

## 2021-01-20 DIAGNOSIS — G4733 Obstructive sleep apnea (adult) (pediatric): Secondary | ICD-10-CM | POA: Diagnosis not present

## 2021-02-03 ENCOUNTER — Encounter: Payer: Self-pay | Admitting: Gastroenterology

## 2021-02-05 ENCOUNTER — Other Ambulatory Visit: Payer: Self-pay | Admitting: Urology

## 2021-02-05 DIAGNOSIS — N402 Nodular prostate without lower urinary tract symptoms: Secondary | ICD-10-CM

## 2021-02-09 ENCOUNTER — Other Ambulatory Visit (HOSPITAL_COMMUNITY): Payer: Self-pay

## 2021-02-16 ENCOUNTER — Ambulatory Visit (AMBULATORY_SURGERY_CENTER): Payer: 59 | Admitting: Gastroenterology

## 2021-02-16 ENCOUNTER — Other Ambulatory Visit: Payer: Self-pay

## 2021-02-16 ENCOUNTER — Encounter: Payer: Self-pay | Admitting: Gastroenterology

## 2021-02-16 VITALS — BP 109/53 | HR 65 | Temp 97.5°F | Resp 10 | Ht 73.0 in | Wt 312.0 lb

## 2021-02-16 DIAGNOSIS — Z1211 Encounter for screening for malignant neoplasm of colon: Secondary | ICD-10-CM

## 2021-02-16 DIAGNOSIS — Z86010 Personal history of colonic polyps: Secondary | ICD-10-CM | POA: Diagnosis not present

## 2021-02-16 DIAGNOSIS — G4733 Obstructive sleep apnea (adult) (pediatric): Secondary | ICD-10-CM | POA: Diagnosis not present

## 2021-02-16 DIAGNOSIS — I10 Essential (primary) hypertension: Secondary | ICD-10-CM | POA: Diagnosis not present

## 2021-02-16 MED ORDER — SODIUM CHLORIDE 0.9 % IV SOLN: 500.0000 mL | Freq: Once | INTRAVENOUS | Status: DC

## 2021-02-16 NOTE — Progress Notes (Signed)
HPI: This is a man with h/o precancerous poylps  Colonoscopy 03/2010:Three polyps, largest was 1.7cm, located in descending colon, the site was labeled with [place] Ink .Biopsies showed TAs without HGD and one polyp was 'vegetable matter.' Colonoscopy 2013 found no recurrent polyps. Colonoscopy 02/2016 one 20mm HP, left diverticulosis   ROS: complete GI ROS as described in HPI, all other review negative.  Constitutional:  No unintentional weight loss   Past Medical History:  Diagnosis Date   Elevated coronary artery calcium score    Essential hypertension    History of colonic polyps    History of diverticulitis    Hyperlipidemia    Impaired fasting glucose    OSA on CPAP     Past Surgical History:  Procedure Laterality Date   COLONOSCOPY  05/23/2011   Procedure: COLONOSCOPY;  Surgeon: Owens Loffler, MD;  Location: [facility];  Service: Endoscopy;  Laterality: N/A;   EYE SURGERY     NASAL SEPTUM SURGERY     TONSILLECTOMY      Current Outpatient Medications  Medication Sig Dispense Refill   amLODipine (NORVASC) 10 MG tablet Take 10 mg by mouth daily. (Patient not taking: Reported on 11/05/2020)     amLODipine (NORVASC) 10 MG tablet Take 1 tablet (10 mg total) by mouth daily. 90 tablet 3   amLODipine (NORVASC) 10 MG tablet Take 1 tablet (10 mg total) by mouth daily. 90 tablet 4   amLODipine (NORVASC) 5 MG tablet TAKE 1 TABLET BY MOUTH ONCE DAILY (Patient not taking: Reported on 11/05/2020) 90 tablet 4   aspirin 81 MG tablet Take 162 mg by mouth daily.      escitalopram (LEXAPRO) 20 MG tablet Take 20 mg by mouth daily.     escitalopram (LEXAPRO) 20 MG tablet TAKE 1 TABLET BY MOUTH ONCE DAILY (Patient not taking: Reported on 11/05/2020) 90 tablet 4   escitalopram (LEXAPRO) 20 MG tablet TAKE 1 TABLET BY MOUTH ONCE DAILY (Patient not taking: Reported on 11/05/2020) 90 tablet 4   levofloxacin (LEVAQUIN) 750 MG tablet Take 1 tablet (750 mg total) by mouth the morning of your biopsy 1 tablet 0    Risankizumab-rzaa (SKYRIZI) 150 MG/ML SOSY Inject 150 mg (1 syringe) into the skin every 12 weeks. 1 mL 3   Risankizumab-rzaa 150 MG/ML SOSY Inject into the skin.     rosuvastatin (CRESTOR) 20 MG tablet Take 1 tablet (20 mg total) by mouth daily. 90 tablet 3   rosuvastatin (CRESTOR) 20 MG tablet Take 1 tablet (20 mg total) by mouth daily. 90 tablet 3   valsartan (DIOVAN) 160 MG tablet Take 160 mg by mouth daily.     valsartan (DIOVAN) 160 MG tablet TAKE 1 TABLET BY MOUTH DAILY * STOP RAMIPRIL (Patient not taking: Reported on 11/05/2020) 90 tablet 4   Current Facility-Administered Medications  Medication Dose Route Frequency Provider Last Rate Last Admin   0.9 %  sodium chloride infusion  500 mL Intravenous Continuous Milus Banister, MD       0.9 %  sodium chloride infusion  500 mL Intravenous Continuous Milus Banister, MD       0.9 %  sodium chloride infusion  500 mL Intravenous Once Milus Banister, MD        Allergies as of 02/16/2021   (No Known Allergies)    Family History  Problem Relation Age of Onset   Heart disease Father    Hypertension Mother    Dementia Mother     Social History  Socioeconomic History   Marital status: Married    Spouse name: Not on file   Number of children: Not on file   Years of education: Not on file   Highest education level: Not on file  Occupational History   Not on file  Tobacco Use   Smoking status: Never   Smokeless tobacco: Never  Vaping Use   Vaping Use: Never used  Substance and Sexual Activity   Alcohol use: No   Drug use: No   Sexual activity: Not on file  Other Topics Concern   Not on file  Social History Narrative   Not on file   Social Determinants of Health   Financial Resource Strain: Not on file  Food Insecurity: Not on file  Transportation Needs: Not on file  Physical Activity: Not on file  Stress: Not on file  Social Connections: Not on file  Intimate Partner Violence: Not on file     Physical  Exam: BP (!) 145/74   Pulse 74   Ht 6\' 1"  (1.854 m)   Wt (!) 312 lb (141.5 kg)   SpO2 96%   BMI 41.16 kg/m  Constitutional: generally well-appearing Psychiatric: alert and oriented x3 Lungs: CTA bilaterally Heart: no MCR  Assessment and plan: 65 y.o. male with h/o precancerous polyps  Colonoscoyp today  Care is appropriate for the ambulatory setting.  Owens Loffler, MD [facility] 02/16/2021, 2:47 PM

## 2021-02-16 NOTE — Progress Notes (Signed)
C.W. vital signs. 

## 2021-02-16 NOTE — Op Note (Signed)
[facility] Patient Name: Jesus Sakuma Md Procedure Date: 02/16/2021 2:34 PM MRN: [id number] Endoscopist: Milus Banister , MD Age: 65 Referring MD:  Date of Birth: December 30, 1955 Gender: Male Account #: [id number] Procedure:                Colonoscopy Indications:              High risk colon cancer surveillance: Personal                            history of colonic polyps: Colonoscopy                            03/2010:Three polyps, largest was 1.7cm, located in                            descending colon, the site was labeled with [place]                            Ink .Biopsies showed TAswithout HGD and one polyp                            was 'vegetable matter.' Colonoscopy 2013 found no                            recurrent polyps. Colonoscopy 02/2016 one 30mm HP,                            left sided diverticulosis Medicines:                Monitored Anesthesia Care Procedure:                Pre-Anesthesia Assessment:                           - Prior to the procedure, a History and Physical                            was performed, and patient medications and                            allergies were reviewed. The patient's tolerance of                            previous anesthesia was also reviewed. The risks                            and benefits of the procedure and the sedation                            options and risks were discussed with the patient.                            All questions were answered, and informed consent  was obtained. Prior Anticoagulants: The patient has                            taken no previous anticoagulant or antiplatelet                            agents. ASA Grade Assessment: III - A patient with                            severe systemic disease. After reviewing the risks                            and benefits, the patient was deemed in                            satisfactory condition to undergo the  procedure.                           After obtaining informed consent, the colonoscope                            was passed under direct vision. Throughout the                            procedure, the patient's blood pressure, pulse, and                            oxygen saturations were monitored continuously. The                            CF [id number] #[id number] was introduced through the anus                            and advanced to the the cecum, identified by                            appendiceal orifice and ileocecal valve. The                            colonoscopy was performed without difficulty. The                            patient tolerated the procedure well. The quality                            of the bowel preparation was good. The ileocecal                            valve, appendiceal orifice, and rectum were                            photographed. Scope In: 3:19:32 PM Scope Out: 3:30:53 PM Scope Withdrawal Time: 0 hours 7 minutes 43 seconds  Total Procedure Duration: 0 hours  11 minutes 21 seconds  Findings:                 Multiple small and large-mouthed diverticula were                            found in the left colon.                           Internal hemorrhoids were found. The hemorrhoids                            were small.                           The exam was otherwise without abnormality on                            direct and retroflexion views. Complications:            No immediate complications. Estimated blood loss:                            None. Estimated Blood Loss:     Estimated blood loss: none. Impression:               - Diverticulosis in the left colon.                           - Internal hemorrhoids.                           - The examination was otherwise normal on direct                            and retroflexion views.                           - No polyps or cancers. Recommendation:           - Patient has a contact number  available for                            emergencies. The signs and symptoms of potential                            delayed complications were discussed with the                            patient. Return to normal activities tomorrow.                            Written discharge instructions were provided to the                            patient.                           - Resume previous diet.                           -  Continue present medications.                           - Repeat colonoscopy in 7 years for screening                            purposes. Milus Banister, MD 02/16/2021 3:35:14 PM This report has been signed electronically.

## 2021-02-16 NOTE — Progress Notes (Signed)
To PACU, VSS. Report to [email or web address] 

## 2021-02-16 NOTE — Patient Instructions (Signed)
Resume previous diet and medications. Repeat Colonoscopy in 7 years for surveillance.  YOU HAD AN ENDOSCOPIC PROCEDURE TODAY AT [facility]:   Refer to the procedure report that was given to you for any specific questions about what was found during the examination.  If the procedure report does not answer your questions, please call your gastroenterologist to clarify.  If you requested that your care partner not be given the details of your procedure findings, then the procedure report has been included in a sealed envelope for you to review at your convenience later.  YOU SHOULD EXPECT: Some feelings of bloating in the abdomen. Passage of more gas than usual.  Walking can help get rid of the air that was put into your GI tract during the procedure and reduce the bloating. If you had a lower endoscopy (such as a colonoscopy or flexible sigmoidoscopy) you may notice spotting of blood in your stool or on the toilet paper. If you underwent a bowel prep for your procedure, you may not have a normal bowel movement for a few days.  Please Note:  You might notice some irritation and congestion in your nose or some drainage.  This is from the oxygen used during your procedure.  There is no need for concern and it should clear up in a day or so.  SYMPTOMS TO REPORT IMMEDIATELY:  Following lower endoscopy (colonoscopy or flexible sigmoidoscopy):  Excessive amounts of blood in the stool  Significant tenderness or worsening of abdominal pains  Swelling of the abdomen that is new, acute  Fever of 100F or higher   For urgent or emergent issues, a gastroenterologist can be reached at any hour by calling [phone]. Do not use MyChart messaging for urgent concerns.    DIET:  We do recommend a small meal at first, but then you may proceed to your regular diet.  Drink plenty of fluids but you should avoid alcoholic beverages for 24 hours.  ACTIVITY:  You should plan to take it easy for  the rest of today and you should NOT DRIVE or use heavy machinery until tomorrow (because of the sedation medicines used during the test).    FOLLOW UP: Our staff will call the number listed on your records 48-72 hours following your procedure to check on you and address any questions or concerns that you may have regarding the information given to you following your procedure. If we do not reach you, we will leave a message.  We will attempt to reach you two times.  During this call, we will ask if you have developed any symptoms of COVID 19. If you develop any symptoms (ie: fever, flu-like symptoms, shortness of breath, cough etc.) before then, please call [phone].  If you test positive for Covid 19 in the 2 weeks post procedure, please call and report this information to [place].    If any biopsies were taken you will be contacted by phone or by letter within the next 1-3 weeks.  Please call us at [phone] if you have not heard about the biopsies in 3 weeks.    SIGNATURES/CONFIDENTIALITY: You and/or your care partner have signed paperwork which will be entered into your electronic medical record.  These signatures attest to the fact that that the information above on your After Visit Summary has been reviewed and is understood.  Full responsibility of the confidentiality of this discharge information lies with you and/or your care-partner.

## 2021-02-18 ENCOUNTER — Telehealth: Payer: Self-pay

## 2021-02-18 NOTE — Telephone Encounter (Signed)
  Follow up Call-  Call back number 02/16/2021  Post procedure Call Back phone  # [phone]  Permission to leave phone message Yes  Some recent data might be hidden     Patient questions:  Do you have a fever, pain , or abdominal swelling? No. Pain Score  0 *  Have you tolerated food without any problems? Yes.    Have you been able to return to your normal activities? Yes.    Do you have any questions about your discharge instructions: Diet   No. Medications  No. Follow up visit  No.  Do you have questions or concerns about your Care? No.  Actions: * If pain score is 4 or above: No action needed, pain <4. Have you developed a fever since your procedure? no  2.   Have you had an respiratory symptoms (SOB or cough) since your procedure? no  3.   Have you tested positive for COVID 19 since your procedure no  4.   Have you had any family members/close contacts diagnosed with the COVID 19 since your procedure?  no   If yes to any of these questions please route to Joylene John, RN and Joella Prince, RN

## 2021-02-19 DIAGNOSIS — H2513 Age-related nuclear cataract, bilateral: Secondary | ICD-10-CM | POA: Diagnosis not present

## 2021-02-21 ENCOUNTER — Ambulatory Visit
Admission: RE | Admit: 2021-02-21 | Discharge: 2021-02-21 | Disposition: A | Discharge status: Home / Self Care | Payer: 59 | Source: Ambulatory Visit | Attending: Urology | Admitting: Urology

## 2021-02-21 DIAGNOSIS — F40240 Claustrophobia: Secondary | ICD-10-CM | POA: Diagnosis not present

## 2021-02-21 DIAGNOSIS — R972 Elevated prostate specific antigen [PSA]: Secondary | ICD-10-CM | POA: Diagnosis not present

## 2021-02-21 DIAGNOSIS — N402 Nodular prostate without lower urinary tract symptoms: Secondary | ICD-10-CM

## 2021-02-21 DIAGNOSIS — K5730 Diverticulosis of large intestine without perforation or abscess without bleeding: Secondary | ICD-10-CM | POA: Diagnosis not present

## 2021-02-21 DIAGNOSIS — R590 Localized enlarged lymph nodes: Secondary | ICD-10-CM | POA: Diagnosis not present

## 2021-02-21 MED ORDER — GADOBENATE DIMEGLUMINE 529 MG/ML IV SOLN
20.0000 mL | Freq: Once | INTRAVENOUS | Status: AC | PRN
  Administered 2021-02-21: 20 mL via INTRAVENOUS

## 2021-02-23 ENCOUNTER — Other Ambulatory Visit (HOSPITAL_COMMUNITY): Payer: Self-pay

## 2021-02-23 MED ORDER — DIAZEPAM 10 MG PO TABS
10.0000 mg | ORAL_TABLET | ORAL | 0 refills | Status: DC
  Filled 2021-02-23: qty 1, 1d supply, fill #0

## 2021-02-26 DIAGNOSIS — H2513 Age-related nuclear cataract, bilateral: Secondary | ICD-10-CM | POA: Insufficient documentation

## 2021-03-04 DIAGNOSIS — H25812 Combined forms of age-related cataract, left eye: Secondary | ICD-10-CM | POA: Diagnosis not present

## 2021-03-09 DIAGNOSIS — D075 Carcinoma in situ of prostate: Secondary | ICD-10-CM | POA: Diagnosis not present

## 2021-03-09 DIAGNOSIS — C61 Malignant neoplasm of prostate: Secondary | ICD-10-CM | POA: Diagnosis not present

## 2021-03-09 DIAGNOSIS — R972 Elevated prostate specific antigen [PSA]: Secondary | ICD-10-CM | POA: Diagnosis not present

## 2021-03-10 ENCOUNTER — Ambulatory Visit: Payer: 59 | Attending: Family Medicine | Admitting: Pharmacist

## 2021-03-10 ENCOUNTER — Other Ambulatory Visit: Payer: Self-pay

## 2021-03-10 DIAGNOSIS — Z79899 Other long term (current) drug therapy: Secondary | ICD-10-CM

## 2021-03-10 NOTE — Progress Notes (Signed)
   S: Patient presents for review of their specialty medication therapy.  Patient is currently taking Skyrizi for psoriasis. Patient is managed by Dr. Nevada Crane for this.   Adherence: confirms  Efficacy: endorses pretty good efficacy   Dosing: Plaque psoriasis, moderate to severe: SubQ: total dose of 150 mg every 12 weeks  Screening: TB test: completed per patient  Monitoring: S/sx of infection: denies S/sx of hypersensitivity/injection site reaction: denies   O:  Lab Results  Component Value Date   WBC 12.1 (H) 05/15/2015   HGB 14.0 05/15/2015   HCT 41.7 05/15/2015   MCV 87.8 05/15/2015   PLT 262 05/15/2015     Chemistry      Component Value Date/Time   NA 138 05/15/2015 2345   K 3.7 05/15/2015 2345   CL 106 05/15/2015 2345   CO2 22 05/15/2015 2345   BUN 21 (H) 05/15/2015 2345   CREATININE 0.95 05/15/2015 2345      Component Value Date/Time   CALCIUM 8.9 05/15/2015 2345   ALKPHOS 60 11/16/2018 0737   AST 19 11/16/2018 0737   ALT 21 11/16/2018 0737   BILITOT 0.8 11/16/2018 0737      A/P: 1. Medication review: Patient currently on [place] for psoriasis. Reviewed the medication with the patient, including the following: Orson Ape is a monoclonal antibody used in the treatment of psoriasis. Patient educated on purpose, proper use and potential adverse effects of Skyrizi. Possible adverse effects are infections, headache, and injection site reactions. Live vaccinations should be avoided while on therapy. No recommendations for any changes.   Benard Halsted, PharmD, [facility] [place], [place] [phone]

## 2021-03-18 ENCOUNTER — Other Ambulatory Visit (HOSPITAL_COMMUNITY): Payer: Self-pay | Admitting: Urology

## 2021-03-18 DIAGNOSIS — C61 Malignant neoplasm of prostate: Secondary | ICD-10-CM

## 2021-03-25 ENCOUNTER — Other Ambulatory Visit: Payer: Self-pay

## 2021-03-25 ENCOUNTER — Ambulatory Visit (HOSPITAL_COMMUNITY)
Admission: RE | Admit: 2021-03-25 | Discharge: 2021-03-25 | Disposition: A | Discharge status: Home / Self Care | Payer: 59 | Source: Ambulatory Visit | Attending: Urology | Admitting: Urology

## 2021-03-25 DIAGNOSIS — K760 Fatty (change of) liver, not elsewhere classified: Secondary | ICD-10-CM | POA: Diagnosis not present

## 2021-03-25 DIAGNOSIS — C61 Malignant neoplasm of prostate: Secondary | ICD-10-CM | POA: Diagnosis not present

## 2021-03-25 DIAGNOSIS — K5730 Diverticulosis of large intestine without perforation or abscess without bleeding: Secondary | ICD-10-CM | POA: Diagnosis not present

## 2021-03-25 DIAGNOSIS — I2510 Atherosclerotic heart disease of native coronary artery without angina pectoris: Secondary | ICD-10-CM | POA: Diagnosis not present

## 2021-03-25 MED ORDER — PIFLIFOLASTAT F 18 (PYLARIFY) INJECTION
9.0000 | Freq: Once | INTRAVENOUS | Status: AC
  Administered 2021-03-25: 9.8 via INTRAVENOUS

## 2021-03-30 ENCOUNTER — Telehealth: Payer: Self-pay

## 2021-03-30 NOTE — Telephone Encounter (Signed)
Left message for patient to call back regarding PMDC on 12/27.

## 2021-03-31 ENCOUNTER — Other Ambulatory Visit (HOSPITAL_COMMUNITY): Payer: Self-pay

## 2021-04-01 ENCOUNTER — Other Ambulatory Visit (HOSPITAL_COMMUNITY): Payer: Self-pay

## 2021-04-01 MED ORDER — VALSARTAN 160 MG PO TABS
160.0000 mg | ORAL_TABLET | Freq: Every day | ORAL | 4 refills | Status: DC
  Filled 2021-04-01: qty 90, 90d supply, fill #0

## 2021-04-02 ENCOUNTER — Other Ambulatory Visit (HOSPITAL_COMMUNITY): Payer: Self-pay

## 2021-04-02 MED ORDER — VALSARTAN 160 MG PO TABS
160.0000 mg | ORAL_TABLET | Freq: Every day | ORAL | 4 refills | Status: DC
  Filled 2021-04-02 – 2021-07-22 (×2): qty 90, 90d supply, fill #0
  Filled 2021-10-31: qty 90, 90d supply, fill #1
  Filled 2022-02-04: qty 90, 90d supply, fill #2

## 2021-04-05 ENCOUNTER — Other Ambulatory Visit (HOSPITAL_COMMUNITY): Payer: Self-pay

## 2021-04-07 NOTE — Progress Notes (Signed)
I called pt to introduce myself as the Prostate Nurse Navigator and the Coordinator of the Prostate [place].   1. I confirmed with the patient he is aware of his referral to the clinic 12/27, arriving @ 12:30 pm.    2. I discussed the format of the clinic and the physicians he will be seeing that day.   3. I discussed where the clinic is located and how to contact me.   4. I confirmed his address and informed him I would be mailing a packet of information and forms to be completed. I asked him to bring them with him the day of his appointment.    He voiced understanding of the above. I asked him to call me if he has any questions or concerns regarding his appointments or the forms he needs to complete.

## 2021-04-09 NOTE — Progress Notes (Signed)
° °                              Care Plan Summary  Name: Dr. Garfield Cornea DOB: May 04, 1955   Your Medical Team:   Urologist -  Dr. Raynelle Bring, [facility]  Radiation Oncologist - Dr. Tyler Pita, [facility]   Medical Oncologist - Dr. Zola Button, [place]  Recommendations: 1) Surgery or  2) ADT (Androgren Deprivation Therapy) with radiation     * These recommendations are based on information available as of todays consult.      Recommendations may change depending on the results of further tests or exams.    Next Steps: 1) Consider your options.   Fayne Norrie, RN once you have decided how you would like to proceed with treatment.    When appointments need to be scheduled, you will be contacted by [facility] and/or [facility].  Questions?  Please do not hesitate to call Katheren Puller, BSN, RN at [phone] with any questions or concerns.  Kathlee Nations is your Oncology Nurse Navigator and is available to assist you while youre receiving your medical care at [facility].

## 2021-04-13 ENCOUNTER — Inpatient Hospital Stay: Payer: 59 | Attending: Oncology | Admitting: Oncology

## 2021-04-13 ENCOUNTER — Other Ambulatory Visit: Payer: Self-pay

## 2021-04-13 ENCOUNTER — Encounter: Payer: Self-pay | Admitting: Genetic Counselor

## 2021-04-13 ENCOUNTER — Ambulatory Visit
Admission: RE | Admit: 2021-04-13 | Discharge: 2021-04-13 | Disposition: A | Discharge status: Home / Self Care | Payer: 59 | Source: Ambulatory Visit | Attending: Radiation Oncology | Admitting: Radiation Oncology

## 2021-04-13 ENCOUNTER — Other Ambulatory Visit (HOSPITAL_COMMUNITY): Payer: Self-pay

## 2021-04-13 VITALS — BP 140/88 | HR 73 | Temp 96.4°F | Resp 18 | Ht 72.0 in | Wt 324.4 lb

## 2021-04-13 DIAGNOSIS — C61 Malignant neoplasm of prostate: Secondary | ICD-10-CM

## 2021-04-13 DIAGNOSIS — Z1379 Encounter for other screening for genetic and chromosomal anomalies: Secondary | ICD-10-CM | POA: Insufficient documentation

## 2021-04-13 DIAGNOSIS — I10 Essential (primary) hypertension: Secondary | ICD-10-CM | POA: Insufficient documentation

## 2021-04-13 NOTE — Progress Notes (Signed)
Reason for the request:    Prostate cancer  HPI: I was asked by Jesus Ramos to evaluate Jesus Ramos for the evaluation of prostate cancer.  He is a 65 year old man with history of hypertension and hyperlipidemia but no other significant comorbid conditions.  He was noted to have an abnormal digital rectal examination on routine physical with PSA rising to 2.6 from 1.7.  He has a limited urinary symptoms at this time but was evaluated by Jesus Ramos and had a concern in addition to rectal examination at that time.  He underwent a prostate biopsy on March 09, 2021 which showed a Gleason score 4+5 = 9 and a targeted lesion after an MRI fusion biopsy.  He underwent PSMA PET scan which did not show any evidence of metastatic disease showed a left apical prostate tracer affinity consistent with his prostate primary but no evidence of metastasis beyond the prostate.  Clinically, he is asymptomatic from his prostate.  He denies any urinary frequency urgency or hesitancy.  He denies any nocturia or dysuria.   He does not report any headaches, blurry vision, syncope or seizures. Does not report any fevers, chills or sweats.  Does not report any cough, wheezing or hemoptysis.  Does not report any chest pain, palpitation, orthopnea or leg edema.  Does not report any nausea, vomiting or abdominal pain.  Does not report any constipation or diarrhea.  Does not report any skeletal complaints.    Does not report frequency, urgency or hematuria.  Does not report any skin rashes or lesions. Does not report any heat or cold intolerance.  Does not report any lymphadenopathy or petechiae.  Does not report any anxiety or depression.  Remaining review of systems is negative.     Past Medical History:  Diagnosis Date   Elevated coronary artery calcium score    Essential hypertension    History of colonic polyps    History of diverticulitis    Hyperlipidemia    Impaired fasting glucose    OSA on CPAP   :   Past Surgical  History:  Procedure Laterality Date   COLONOSCOPY  05/23/2011   Procedure: COLONOSCOPY;  Surgeon: Owens Loffler, MD;  Location: [facility];  Service: Endoscopy;  Laterality: N/A;   EYE SURGERY     NASAL SEPTUM SURGERY     TONSILLECTOMY    :   Current Outpatient Medications:    amLODipine (NORVASC) 10 MG tablet, Take 10 mg by mouth daily., Disp: , Rfl:    amLODipine (NORVASC) 10 MG tablet, Take 1 tablet (10 mg total) by mouth daily., Disp: 90 tablet, Rfl: 3   amLODipine (NORVASC) 10 MG tablet, Take 1 tablet (10 mg total) by mouth daily., Disp: 90 tablet, Rfl: 4   amLODipine (NORVASC) 5 MG tablet, TAKE 1 TABLET BY MOUTH ONCE DAILY (Patient not taking: No sig reported), Disp: 90 tablet, Rfl: 4   aspirin 81 MG tablet, Take 162 mg by mouth daily. , Disp: , Rfl:    diazepam (VALIUM) 10 MG tablet, Take 1 tablet (10 mg total) by mouth 30-60 minutes prior to your procedure, Disp: 1 tablet, Rfl: 0   escitalopram (LEXAPRO) 20 MG tablet, Take 20 mg by mouth daily., Disp: , Rfl:    escitalopram (LEXAPRO) 20 MG tablet, TAKE 1 TABLET BY MOUTH ONCE DAILY (Patient not taking: Reported on 11/05/2020), Disp: 90 tablet, Rfl: 4   escitalopram (LEXAPRO) 20 MG tablet, TAKE 1 TABLET BY MOUTH ONCE DAILY (Patient not taking: No sig reported),  Disp: 90 tablet, Rfl: 4   levofloxacin (LEVAQUIN) 750 MG tablet, Take 1 tablet (750 mg total) by mouth the morning of your biopsy (Patient not taking: Reported on 02/16/2021), Disp: 1 tablet, Rfl: 0   Risankizumab-rzaa (SKYRIZI) 150 MG/ML SOSY, Inject 150 mg (1 syringe) into the skin every 12 weeks., Disp: 1 mL, Rfl: 3   Risankizumab-rzaa 150 MG/ML SOSY, Inject into the skin., Disp: , Rfl:    rosuvastatin (CRESTOR) 20 MG tablet, Take 1 tablet (20 mg total) by mouth daily., Disp: 90 tablet, Rfl: 3   rosuvastatin (CRESTOR) 20 MG tablet, Take 1 tablet (20 mg total) by mouth daily., Disp: 90 tablet, Rfl: 3   valsartan (DIOVAN) 160 MG tablet, Take 160 mg by mouth daily., Disp: , Rfl:     valsartan (DIOVAN) 160 MG tablet, Take 1 tablet (160 mg total) by mouth daily., Disp: 90 tablet, Rfl: 4   valsartan (DIOVAN) 160 MG tablet, Take 1 tablet (160 mg total) by mouth daily., Disp: 90 tablet, Rfl: 4:  No Known Allergies:   Family History  Problem Relation Age of Onset   Heart disease Father    Hypertension Mother    Dementia Mother   :   Social History   Socioeconomic History   Marital status: Married    Spouse name: Not on file   Number of children: Not on file   Years of education: Not on file   Highest education level: Not on file  Occupational History   Not on file  Tobacco Use   Smoking status: Never   Smokeless tobacco: Never  Vaping Use   Vaping Use: Never used  Substance and Sexual Activity   Alcohol use: No   Drug use: No   Sexual activity: Not on file  Other Topics Concern   Not on file  Social History Narrative   Not on file   Social Determinants of Health   Financial Resource Strain: Not on file  Food Insecurity: Not on file  Transportation Needs: Not on file  Physical Activity: Not on file  Stress: Not on file  Social Connections: Not on file  Intimate Partner Violence: Not on file  :     NM PET (PSMA) SKULL TO MID THIGH  Result Date: 03/26/2021 CLINICAL DATA:  Initial diagnosis of prostate cancer via biopsy 2 weeks ago. EXAM: NUCLEAR MEDICINE PET SKULL BASE TO THIGH TECHNIQUE: 9.8 mCi F-18 Fluciclovine was injected intravenously. Full-ring PET imaging was performed from the skull base to thigh after the radiotracer. CT data was obtained and used for attenuation correction and anatomic localization. COMPARISON:  Prostate MR of 02/21/2021 FINDINGS: NECK No radiotracer activity in neck lymph nodes. Incidental CT finding: No cervical adenopathy. CHEST No radiotracer accumulation within mediastinal or hilar lymph nodes. No suspicious pulmonary nodules on the CT scan. Incidental CT finding: Aortic atherosclerosis. Dense 3 vessel coronary  artery calcification. No thoracic adenopathy. ABDOMEN/PELVIS Prostate: Left apical paramidline tracer affinity, including at a S.U.V. max of 13.4 on 228/4. Lymph nodes: No abnormal radiotracer accumulation within pelvic or abdominal nodes. Liver: No evidence of liver metastasis Incidental CT finding: Mild hepatic steatosis. Normal imaged portions of the spleen, stomach, pancreas, gallbladder, adrenal glands, kidneys. Extensive colonic diverticulosis. No abdominopelvic adenopathy. Abdominal aortic atherosclerosis. SKELETON No focal  activity to suggest skeletal metastasis. IMPRESSION: 1. Left apical prostatic tracer affinity, consistent with site of primary. 2. No tracer avid metastatic disease. 3. Incidental findings, including: Coronary artery atherosclerosis. Aortic Atherosclerosis (ICD10-I70.0). Hepatic steatosis. Electronically Signed  By: Abigail Miyamoto M.D.   On: 03/26/2021 16:45    Assessment and Plan:    65 year old with prostate cancer diagnosed in November 2022.  He was found to have a Gleason score of 9 with PSA of 3.6.  PSMA PET scan did not show any evidence of metastatic disease.  Clinically he is asymptomatic at this time.  His case was discussed today in the prostate cancer multidisciplinary clinic and treatment options were reviewed.  His imaging studies were reviewed with radiology and pathology results were discussed with the reviewing pathologist.  He does have high risk localized disease although it is reassuring that he does not have PET avid disease beyond the prostate.  Treatment choices including primary surgical therapy versus radiation therapy and long-term ADT were discussed.  Risks and benefits of this approach were reiterated.  Complications with both therapies were discussed.  Advantages of surgery including complete surgical staging and likelihood of using radiation afterwards could be an option.  Complication associated with androgen deprivation is also discussed as an  alternative with radiation.  He is include flashes, weight gain, sexual dysfunction.  We discussed today in detail the role for additional systemic therapy including Taxotere in the neoadjuvant setting as well as in the advanced setting and additional hormonal therapy including androgen receptor pathway inhibitors.  These options will be deferred unless he has metastatic disease in the future.  He will consider these options and make a determination in the near future.  He is leaning towards primary surgical therapy and use radiation as a salvage.  Although he will make up his mind in the near future.  All his questions were answered to his satisfaction.   45  minutes were dedicated to this visit. The time was spent on reviewing laboratory data, imaging studies, discussing treatment options, reviewing pathology results, answering questions regarding future plan.     A copy of this consult has been forwarded to the requesting physician.

## 2021-04-13 NOTE — Progress Notes (Signed)
Radiation Oncology         [phone] ________________________________  Multidisciplinary Prostate Cancer Clinic  Initial Radiation Oncology Consultation  Name: Rudell Ortman, MD MRN: [id number]  Date: 04/13/2021  DOB: June 02, 1955  SW:NIOEV, [name], MD  Raynelle Bring, MD   REFERRING PHYSICIAN: Raynelle Bring, MD  DIAGNOSIS: 65 y.o. 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  He was noted to have a prostate nodule on physical exam by his primary care physician, Dr. Willey Blade. While his PSA has been slowly rising, it has remained in the normal range at 2.6 in April 2022, but given his abnormal prostate exam, he was referred for evaluation in urology by Dr. Alinda Money on 01/19/21. Digital rectal examination was performed at that time revealing significant nodularity in the right hemi-prostate, extending towards the right apex, but without definite extra prostatic extension noted.  A repeat PSA obtained that day was stable at 2.29. He underwent prostate MRI on 02/21/21 showing a PI-RADS 5 lesion in l the eft posterolateral and posterior peripheral zone tracking towards the prostate apex and crossing midline at the inferior-most aspect of prostate with signs of extracapsular extension and neurovascular bundle involvement.there was no evidence of pelvic lymphadenopathy or bone metastases.  The patient proceeded to MRI fusion biopsy of the prostate on 03/09/21.  The prostate volume measured 40.9 cc.  Out of 16 core biopsies, 8 were positive.  The maximum Gleason score was 4+5, and this was seen in all four ROI MRI samples (with perineural invasion) and the left mid. Additionally, Gleason 4+4 was seen in the left apex (with PNI), Gleason 4+3 in the left  apex lateral, and Gleason 3+4 in the left base lateral.    A PSMA PET scan was performed on 03/25/21 for disease staging and showed left apical prostatic tracer affinity, consistent with site of primary with likely neurovascular bundle involvement on the left but no tracer-avid metastatic disease.  The patient reviewed the biopsy results with his urologist and he has kindly been referred today to the multidisciplinary prostate cancer clinic for presentation of pathology and radiology studies in our conference for discussion of potential radiation treatment options and clinical evaluation.  He is accompanied today by his wife and daughter.    PREVIOUS RADIATION THERAPY: No  PAST MEDICAL HISTORY:  has a past medical history of Elevated coronary artery calcium score, Essential hypertension, History of colonic polyps, History of diverticulitis, Hyperlipidemia, Impaired fasting glucose, and OSA on CPAP.    PAST SURGICAL HISTORY: Past Surgical History:  Procedure Laterality Date   COLONOSCOPY  05/23/2011   Procedure: COLONOSCOPY;  Surgeon: Owens Loffler, MD;  Location: [facility];  Service: Endoscopy;  Laterality: N/A;   EYE SURGERY     NASAL SEPTUM SURGERY     TONSILLECTOMY      FAMILY HISTORY: family history includes Dementia in his mother; Heart disease in his father; Hypertension in his mother.  SOCIAL HISTORY:  reports that he has never smoked. He has never used smokeless tobacco. He reports that he does not drink alcohol and does not use drugs.  ALLERGIES: Patient has no known allergies.  MEDICATIONS:  Current Outpatient Medications  Medication Sig Dispense Refill   amLODipine (NORVASC) 10 MG tablet  Take 10 mg by mouth daily.     amLODipine (NORVASC) 10 MG tablet Take 1 tablet (10 mg total) by mouth daily. 90 tablet 3   amLODipine (NORVASC) 10 MG tablet Take 1 tablet (10 mg total) by mouth daily. 90 tablet 4   amLODipine (NORVASC) 5 MG tablet TAKE 1 TABLET BY MOUTH ONCE DAILY  (Patient not taking: No sig reported) 90 tablet 4   aspirin 81 MG tablet Take 162 mg by mouth daily.      diazepam (VALIUM) 10 MG tablet Take 1 tablet (10 mg total) by mouth 30-60 minutes prior to your procedure 1 tablet 0   escitalopram (LEXAPRO) 20 MG tablet Take 20 mg by mouth daily.     escitalopram (LEXAPRO) 20 MG tablet TAKE 1 TABLET BY MOUTH ONCE DAILY (Patient not taking: Reported on 11/05/2020) 90 tablet 4   escitalopram (LEXAPRO) 20 MG tablet TAKE 1 TABLET BY MOUTH ONCE DAILY (Patient not taking: No sig reported) 90 tablet 4   levofloxacin (LEVAQUIN) 750 MG tablet Take 1 tablet (750 mg total) by mouth the morning of your biopsy (Patient not taking: Reported on 02/16/2021) 1 tablet 0   Risankizumab-rzaa (SKYRIZI) 150 MG/ML SOSY Inject 150 mg (1 syringe) into the skin every 12 weeks. 1 mL 3   Risankizumab-rzaa 150 MG/ML SOSY Inject into the skin.     rosuvastatin (CRESTOR) 20 MG tablet Take 1 tablet (20 mg total) by mouth daily. 90 tablet 3   rosuvastatin (CRESTOR) 20 MG tablet Take 1 tablet (20 mg total) by mouth daily. 90 tablet 3   valsartan (DIOVAN) 160 MG tablet Take 160 mg by mouth daily.     valsartan (DIOVAN) 160 MG tablet Take 1 tablet (160 mg total) by mouth daily. 90 tablet 4   valsartan (DIOVAN) 160 MG tablet Take 1 tablet (160 mg total) by mouth daily. 90 tablet 4   No current facility-administered medications for this encounter.    REVIEW OF SYSTEMS:  On review of systems, the patient reports that he is doing well overall. He denies any chest pain, shortness of breath, cough, fevers, chills, night sweats, unintended weight changes. He denies any bowel disturbances, and denies abdominal pain, nausea or vomiting. He denies any new musculoskeletal or joint aches or pains. His IPSS was 6, indicating mild urinary symptoms. His SHIM was 19, indicating he has mild erectile dysfunction. A complete review of systems is obtained and is otherwise negative.   PHYSICAL EXAM:  Wt Readings  from Last 3 Encounters:  02/16/21 (!) 312 lb (141.5 kg)  11/05/20 (!) 312 lb (141.5 kg)  05/19/20 (!) 323 lb 9.6 oz (146.8 kg)   Temp Readings from Last 3 Encounters:  02/16/21 (!) 97.5 F (36.4 C)  05/19/20 (!) 97 F (36.1 C) (Other (Comment))   BP Readings from Last 3 Encounters:  02/16/21 (!) 109/53  11/05/20 (!) 158/76  05/19/20 (!) 136/94   Pulse Readings from Last 3 Encounters:  02/16/21 65  11/05/20 68  05/19/20 63    /10  In general this is a well appearing Caucasian male in no acute distress. He's alert and oriented x4 and appropriate throughout the examination. Cardiopulmonary assessment is negative for acute distress and he exhibits normal effort.    KPS = 100  100 - Normal; no complaints; no evidence of disease. 90   - Able to carry on normal activity; minor signs or symptoms of disease. 80   - Normal activity with effort; some signs or symptoms of  disease. 78   - Cares for self; unable to carry on normal activity or to do active work. 60   - Requires occasional assistance, but is able to care for most of his personal needs. 50   - Requires considerable assistance and frequent medical care. 65   - Disabled; requires special care and assistance. 77   - Severely disabled; hospital admission is indicated although death not imminent. 40   - Very sick; hospital admission necessary; active supportive treatment necessary. 10   - Moribund; fatal processes progressing rapidly. 0     - Dead  Karnofsky DA, Abelmann [place], Craver LS and Burchenal JH [phone]) The use of the nitrogen mustards in the palliative treatment of carcinoma: with particular reference to bronchogenic carcinoma Cancer 1 [phone]   LABORATORY DATA:  Lab Results  Component Value Date   WBC 12.1 (H) 05/15/2015   HGB 14.0 05/15/2015   HCT 41.7 05/15/2015   MCV 87.8 05/15/2015   PLT 262 05/15/2015   Lab Results  Component Value Date   NA 138 05/15/2015   K 3.7 05/15/2015   CL 106 05/15/2015   CO2 22  05/15/2015   Lab Results  Component Value Date   ALT 21 11/16/2018   AST 19 11/16/2018   ALKPHOS 60 11/16/2018   BILITOT 0.8 11/16/2018     RADIOGRAPHY: NM PET (PSMA) SKULL TO MID THIGH  Result Date: 03/26/2021 CLINICAL DATA:  Initial diagnosis of prostate cancer via biopsy 2 weeks ago. EXAM: NUCLEAR MEDICINE PET SKULL BASE TO THIGH TECHNIQUE: 9.8 mCi F-18 Fluciclovine was injected intravenously. Full-ring PET imaging was performed from the skull base to thigh after the radiotracer. CT data was obtained and used for attenuation correction and anatomic localization. COMPARISON:  Prostate MR of 02/21/2021 FINDINGS: NECK No radiotracer activity in neck lymph nodes. Incidental CT finding: No cervical adenopathy. CHEST No radiotracer accumulation within mediastinal or hilar lymph nodes. No suspicious pulmonary nodules on the CT scan. Incidental CT finding: Aortic atherosclerosis. Dense 3 vessel coronary artery calcification. No thoracic adenopathy. ABDOMEN/PELVIS Prostate: Left apical paramidline tracer affinity, including at a S.U.V. max of 13.4 on 228/4. Lymph nodes: No abnormal radiotracer accumulation within pelvic or abdominal nodes. Liver: No evidence of liver metastasis Incidental CT finding: Mild hepatic steatosis. Normal imaged portions of the spleen, stomach, pancreas, gallbladder, adrenal glands, kidneys. Extensive colonic diverticulosis. No abdominopelvic adenopathy. Abdominal aortic atherosclerosis. SKELETON No focal  activity to suggest skeletal metastasis. IMPRESSION: 1. Left apical prostatic tracer affinity, consistent with site of primary. 2. No tracer avid metastatic disease. 3. Incidental findings, including: Coronary artery atherosclerosis. Aortic Atherosclerosis (ICD10-I70.0). Hepatic steatosis. Electronically Signed   By: Abigail Miyamoto M.D.   On: 03/26/2021 16:45      IMPRESSION/PLAN: 66 y.o. gentleman with Stage T2a adenocarcinoma of the prostate with a Gleason score of 4+5 and a PSA  of 2.29.    We discussed the patient's workup and outlined the nature of prostate cancer in this setting. The patient's T stage, Gleason's score, and PSA put him into the high risk group. Accordingly, he is eligible for a variety of potential treatment options including prostatectomy or LT-ADT in combination with either 8 weeks of external radiation or 5 weeks of external radiation with an upfront brachytherapy boost. We discussed the available radiation techniques, and focused on the details and logistics of delivery. We discussed and outlined the risks, benefits, short and long-term effects associated with radiotherapy and compared and contrasted these with prostatectomy. We discussed the role  of SpaceOAR gel in reducing the rectal toxicity associated with radiotherapy. We also detailed the role of ADT in the treatment of high risk prostate cancer and outlined the associated side effects that could be expected with this therapy.  Today I reviewed the findings and workup thus far.  We discussed the natural history of prostate cancer.  We reviewed the the implications of T-stage, Gleason's Score, and PSA on decision-making and outcomes related to prostate cancer.  We discussed radiation treatment in the management of prostate cancer with regard to the logistics and delivery of external beam radiation treatment as well as the logistics and delivery of prostate brachytherapy.  We compared and contrasted each of these approaches and also compared these against prostatectomy.    The patient focused most of his questions and interest in robotic-assisted laparoscopic radical prostatectomy.  We discussed some of the potential advantages of surgery including surgical staging, the availability of salvage radiotherapy to the prostatic fossa, and the confidence associated with immediate biochemical response.  We discussed some of the potential proven indications for postoperative radiotherapy including positive margins,  extracapsular extension, and seminal vesicle involvement. We also talked about some of the other potential findings leading to a recommendation for radiotherapy including a non-zero postoperative PSA and positive lymph nodes.   He appears to have a good understanding of his disease and our treatment recommendations which are of curative intent.  He and his family were encouraged to ask questions that were answered to their stated satisfaction.  At the end of the conversation the patient remains undecided regarding his treatment preference and would like to take some additional time to consider his options.  He has also met with Dr. Alen Blew and Dr. Alinda Money today in clinic to further discuss treatment options.  He has our contact information and will let us know if he ultimately decides to proceed with radiation.  We enjoyed meeting him and his family today and look forward to following along in his care.  We personally spent 60 minutes in this encounter including chart review, reviewing radiological studies, meeting face-to-face with the patient, entering orders and completing documentation.    Nicholos Johns, PA-C    Tyler Pita, MD  [facility] Direct Dial: [phone]   Fax: [phone] [email or web address]   Skype   [email or web address]   This document serves as a record of services personally performed by Tyler Pita, MD and Freeman Caldron, PA-C. It was created on their behalf by Wilburn Mylar, a trained medical scribe. The creation of this record is based on the scribe's personal observations and the provider's statements to them. This document has been checked and approved by the attending provider.

## 2021-04-13 NOTE — Consult Note (Signed)
[facility]     04/13/2021   --------------------------------------------------------------------------------   Jesus Ramos. [place]  MRN: [id number]  DOB: Nov 30, 1955, 65 year old Male  SSN: [id number]   PRIMARY CARE:    REFERRING:  Paula Compton. Claudia Pollock, MD  PROVIDER:  Raynelle Bring, M.D.  LOCATION:  [facility] [phone]     --------------------------------------------------------------------------------   CC/HPI: CC: Prostate Cancer   PCP: Dr. 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 Willey Blade during a routine physical exam with a right sided prostate nodule noted. His PSA was normal but had increased from a baseline leave of 1.1 in 2018 to 1.6 in 2020, 1.7 in 2021, and 2.6 when checked in 2022. I evaluated him in October 2022 and his exam was concerning enough to recommend a biopsy but we did obtain an MRI of the prostate first. This was done on 02/21/21 and indicated a PI-RADS 5 lesion with concern with possible EPE bilaterally (worse left than right) but no SVI, LAD, or bone lesions. An MR/US fusion biopsy was performed on 03/09/21 and confirmed Gleason 4+5=9 adenocarcinoma of the prostate with 4 out of 4 targeted biopsies positive and 4 out of 12 systematic cores positive for a total of 8 out of 16 cores positive.   Family history: None.   Imaging studies: PSMA PET scan (03/26/21): Negative for metastatic disease.   PMH: He has a history of hyperlipidemia, hypertension, sleep apnea, and psoriasis. He does take Skyrizi 360 mg every 3 months. He also has a history of a high calcium score but underwent a stress test within the past year that was negative and did not require any further testing or treatment.  PSH: No abdominal  surgeries.   TNM stage: cT3a N0 M0  PSA: 2.6  Gleason score: 4+5=9 (GG 5)  Biopsy (03/09/21): 8/16 cores positive  Left: L lateral apex (60%, 4+3=7), L apex 70%, 4+4=8), L mid (10%, 4+5=9), L lateral base (20%, 3+4=7)  Right: Benign  Targeted: 4/4 cores (70%, 70%, 40%, 40%, 4+5=9, PNI)  Prostate volume: 40.9 cc   Nomogram  OC disease: 10%  EPE: 88%  SVI: 30%  LNI: 35%  PFS (5 year, 10 year): 40%, 21%   Urinary function: IPSS is 4.  Erectile function: SHIM score is 20.     ALLERGIES: No Allergies    MEDICATIONS: Aspirin  Crestor 20 mg tablet  Diazepam 10 mg tablet Take 10 mg 30-60 minutes prior to your procedure  Levofloxacin 750 mg tablet Please take one tablet the morning of your biopsy.  Amlodipine Besylate 10 mg tablet  Lexapro  Skyrizi  Valsartan 160 mg tablet     GU PSH: Prostate Needle Biopsy - 03/09/2021     NON-GU PSH: Colonoscopy Eye Surgery (Unspecified) Surgical Pathology, Gross And Microscopic Examination For Prostate Needle - 03/09/2021 Tonsillectomy     GU PMH: Prostate nodule w/o LUTS - 03/09/2021, - 01/19/2021    NON-GU PMH: Hypercholesterolemia Hypertension Psoriasis, unspecified Sleep Apnea    FAMILY HISTORY: 1 Daughter - Runs in Family 1 son - Runs in Family Hypertension - Runs in Family   SOCIAL HISTORY: Marital Status: Married Preferred Language: English; Ethnicity: Not Hispanic Or Latino; Race: White Current Smoking Status: Patient has never smoked.   Tobacco Use  Assessment Completed: Used Tobacco in last 30 days? Has never drank.  Drinks 2 caffeinated drinks per day.    REVIEW OF SYSTEMS:    GU Review Male:   Patient denies frequent urination, hard to postpone urination, burning/ pain with urination, get up at night to urinate, leakage of urine, stream starts and stops, trouble starting your streams, and have to strain to urinate .  Gastrointestinal (Upper):   Patient denies nausea and vomiting.  Gastrointestinal (Lower):    Patient denies diarrhea and constipation.  Constitutional:   Patient denies fever, night sweats, weight loss, and fatigue.  Skin:   Patient denies skin rash/ lesion and itching.  Eyes:   Patient denies blurred vision and double vision.  Ears/ Nose/ Throat:   Patient denies sore throat and sinus problems.  Hematologic/Lymphatic:   Patient denies swollen glands and easy bruising.  Cardiovascular:   Patient denies leg swelling and chest pains.  Respiratory:   Patient denies cough and shortness of breath.  Endocrine:   Patient denies excessive thirst.  Musculoskeletal:   Patient denies back pain and joint pain.  Neurological:   Patient denies headaches and dizziness.  Psychologic:   Patient denies depression and anxiety.   VITAL SIGNS: None   MULTI-SYSTEM PHYSICAL EXAMINATION:    Constitutional: Well-nourished. No physical deformities. Normally developed. Good grooming.  Neck: Neck symmetrical, not swollen. Normal tracheal position.  Respiratory: No labored breathing, no use of accessory muscles. Clear bilaterally.  Cardiovascular: Normal temperature, normal extremity pulses, no swelling, no varicosities. Regular rate and rhythm.  Lymphatic: No enlargement of neck, axillae, groin.  Skin: No paleness, no jaundice, no cyanosis. No lesion, no ulcer, no rash.  Neurologic / Psychiatric: Oriented to time, oriented to place, oriented to person. No depression, no anxiety, no agitation.  Gastrointestinal: No mass, no tenderness, no rigidity, abdomen.   Eyes: Normal conjunctivae. Normal eyelids.  Musculoskeletal: Normal gait and station of head and neck.     Complexity of Data:  Lab Test Review:   PSA  Records Review:   Pathology Reports, Previous Patient Records  X-Ray Review: PET Scan: Reviewed Films.     01/19/21  PSA  Total PSA 2.29 ng/mL   Notes:                     CLINICAL DATA: Initial diagnosis of prostate cancer via biopsy 64 years ago.   EXAM:  NUCLEAR MEDICINE PET SKULL BASE TO  THIGH   TECHNIQUE:  9.8 mCi F-18 Fluciclovine was injected intravenously. Full-ring PET  imaging was performed from the skull base to thigh after the  radiotracer. CT data was obtained and used for attenuation  correction and anatomic localization.   COMPARISON: Prostate MR of 02/21/2021   FINDINGS:  NECK   No radiotracer activity in neck lymph nodes.   Incidental CT finding: No cervical adenopathy.   CHEST   No radiotracer accumulation within mediastinal or hilar lymph nodes.  No suspicious pulmonary nodules on the CT scan.   Incidental CT finding: Aortic atherosclerosis. Dense 3 vessel  coronary artery calcification. No thoracic adenopathy.   ABDOMEN/PELVIS   Prostate: Left apical paramidline tracer affinity, including at a  S.U.V. max of 13.4 on 228/4.   Lymph nodes: No abnormal radiotracer accumulation within pelvic or  abdominal nodes.   Liver: No evidence of liver metastasis   Incidental CT finding: Mild hepatic steatosis. Normal imaged  portions of the spleen, stomach, pancreas, gallbladder, adrenal  glands, kidneys. Extensive colonic diverticulosis.  No abdominopelvic  adenopathy. Abdominal aortic atherosclerosis.   SKELETON   No focal activity to suggest skeletal metastasis.   IMPRESSION:  1. Left apical prostatic tracer affinity, consistent with site of  primary.  2. No tracer avid metastatic disease.  3. Incidental findings, including: Coronary artery atherosclerosis.  Aortic Atherosclerosis (ICD10-I70.0). Hepatic steatosis.    Electronically Signed  By: Abigail Miyamoto M.D.  On: 03/26/2021 16:45   PROCEDURES: None   ASSESSMENT:      ICD-10 Details  1 GU:   Prostate Cancer - C61    PLAN:           Document Letter(s):  Created for Patient: Clinical Summary         Notes:   1. Very high risk, locally advanced prostate cancer: I had a detailed discussion with Jesus Ramos and his wife today regarding his prostate cancer situation. He has been counseled  earlier today by both Dr. Alen Blew and Dr. Tammi Klippel. We reviewed his PSMA PET scan which fortunately was negative for metastatic disease. We again discussed his MRI which suggested concern for possible extraprostatic extension bilaterally. The patient was counseled about the natural history of prostate cancer and the standard treatment options that are available for prostate cancer. It was explained to him how his age and life expectancy, clinical stage, Gleason score/prognostic grade group, and PSA (and PSA density) affect his prognosis, the decision to proceed with additional staging studies, as well as how that information influences recommended treatment strategies. We discussed the roles for active surveillance, radiation therapy, surgical therapy, androgen deprivation, as well as ablative therapy and other invesitgational options for the treatment of prostate cancer as appropriate to his individual cancer situation. We discussed the risks and benefits of these options with regard to their impact on cancer control and also in terms of potential adverse events, complications, and impact on quality of life particularly related to urinary and sexual function. The patient was encouraged to ask questions throughout the discussion today and all questions were answered to his stated satisfaction. In addition, the patient was provided with and/or directed to appropriate resources and literature for further education about prostate cancer and treatment options. We discussed surgical therapy for prostate cancer including the different available surgical approaches. We discussed, in detail, the risks and expectations of surgery with regard to cancer control, urinary control, and erectile function as well as the expected postoperative recovery process. Additional risks of surgery including but not limited to bleeding, infection, hernia formation, nerve damage, lymphocele formation, bowel/rectal injury potentially  necessitating colostomy, damage to the urinary tract resulting in urine leakage, urethral stricture, and the cardiopulmonary risks such as myocardial infarction, stroke, death, venothromboembolism, etc. were explained. The risk of open surgical conversion for robotic/laparoscopic prostatectomy was also discussed.   At this time, Jesus Ramos is heavily leaning toward primary surgical therapy and understands the significant risk of requiring subsequent adjuvant or salvage treatment considering his very high risk disease. We did discuss the necessity of proceeding with a wide resection, non-nerve sparing approach if we are to proceed with surgery and how this would negatively impact his postoperative erectile function. He has very appropriate expectations. He will notify me of his final decision sometime over the next few days.   If he proceeds with surgery, my plan would be to perform a non-nerve sparing robot-assisted laparoscopic radical prostatectomy and bilateral pelvic lymphadenectomy. I would also plan to discuss perioperative management of his [place] although would not plan to significantly alter our plans for treatment  considering his very high risk disease. However, if he has not received this and is due sometime over the next month, it may be beneficial to delay this until after he has recovered from surgery.       E & M CODES: We spent 68 minutes dedicated to evaluation and management time, including face to face interaction, discussions on coordination of care, documentation, result review, and discussion with others as applicable.

## 2021-04-14 ENCOUNTER — Other Ambulatory Visit (HOSPITAL_COMMUNITY): Payer: Self-pay

## 2021-04-20 ENCOUNTER — Encounter: Payer: Self-pay | Admitting: General Practice

## 2021-04-20 ENCOUNTER — Other Ambulatory Visit (HOSPITAL_COMMUNITY): Payer: Self-pay

## 2021-04-20 NOTE — Progress Notes (Signed)
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  The patient scored a 3 on the Psychosocial Distress Thermometer which indicates mild distress. Also assessed for distress and other psychosocial needs.   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 He notes in an upbeat tone that he has "support from family and friends coming out of the woodwork" and is overall "doing as well as anybody could be" in such a situation.  Follow up needed: No. 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   [name], [place], [facility] Pager [phone] Voicemail [phone]

## 2021-04-20 NOTE — Progress Notes (Signed)
Pt presented to [facility] on 04/13/2021.  Stage T2a adenocarcinoma of the prostate with a Gleason score of 4+5 and a PSA of 2.29.    RN spoke with patient to follow up regarding decision for treatment.  Pt has decided to proceed with surgical intervention.  RN notified care team.  Will continue to follow up to ensure continuity of care.

## 2021-04-21 ENCOUNTER — Other Ambulatory Visit: Payer: Self-pay | Admitting: Urology

## 2021-04-21 ENCOUNTER — Other Ambulatory Visit (HOSPITAL_COMMUNITY): Payer: Self-pay

## 2021-04-22 DIAGNOSIS — C61 Malignant neoplasm of prostate: Secondary | ICD-10-CM

## 2021-05-04 DIAGNOSIS — C61 Malignant neoplasm of prostate: Secondary | ICD-10-CM | POA: Diagnosis not present

## 2021-05-05 DIAGNOSIS — C61 Malignant neoplasm of prostate: Secondary | ICD-10-CM | POA: Diagnosis not present

## 2021-05-05 DIAGNOSIS — R278 Other lack of coordination: Secondary | ICD-10-CM | POA: Diagnosis not present

## 2021-05-11 NOTE — Patient Instructions (Addendum)
DUE TO COVID-19 ONLY ONE VISITOR IS ALLOWED TO COME WITH YOU AND STAY IN THE WAITING ROOM ONLY DURING PRE OP AND PROCEDURE.   **NO VISITORS ARE ALLOWED IN THE SHORT STAY AREA OR RECOVERY ROOM!!**  IF YOU WILL BE ADMITTED INTO THE HOSPITAL YOU ARE ALLOWED ONLY TWO SUPPORT PEOPLE DURING VISITATION HOURS ONLY (7 AM -8PM)   The support person(s) must pass our screening, gel in and out, and wear a mask at all times, including in the patients room. Patients must also wear a mask when staff or their support person are in the room. Visitors GUEST BADGE MUST BE WORN VISIBLY  One adult visitor may remain with you overnight and MUST be in the room by 8 P.M.  No visitors under the age of 2. Any visitor under the age of 47 must be accompanied by an adult.    COVID SWAB TESTING MUST BE COMPLETED ON: 05/18/21 @ 8:00 AM    Site: [place] are not required to quarantine, however you are required to wear a well-fitted mask when you are out and around people not in your household.  Hand Hygiene often Do NOT share personal items Notify your provider if you are in close contact with someone who has COVID or you develop fever 100.4 or greater, new onset of sneezing, cough, sore throat, shortness of breath or body aches.  [address] [place], [address], must go inside of the hospital, NOT A DRIVE THRU!  (Must self quarantine after testing. Follow instructions on handout.)       Your procedure is scheduled on: 05/21/21   Report to [facility] Main Entrance    Report to admitting at: 9:15 AM   Call this number if you have problems the morning of surgery [phone]  CLEAR LIQUID DIET : [place]: 8:30 AM.  Foods Allowed                                                                     Foods Excluded  Water, Black Coffee and tea, regular and decaf                             liquids that you cannot   Plain Jell-O in any flavor  (No red)                                           see through such as: Fruit ices (not with fruit pulp)                                     milk, soups, orange juice              Iced Popsicles (No red)                                    All solid  food                                   Apple juices Sports drinks like Gatorade (No red) Lightly seasoned clear broth or consume(fat free) Sugar Sample Menu Breakfast                                Lunch                                     Supper Cranberry juice                    Beef broth                            Chicken broth Jell-O                                     Grape juice                           Apple juice Coffee or tea                        Jell-O                                      Popsicle                                                Coffee or tea                        Coffee or tea      Oral Hygiene is also important to reduce your risk of infection.                                    Remember - BRUSH YOUR TEETH THE MORNING OF SURGERY WITH YOUR REGULAR TOOTHPASTE   Do NOT smoke after Midnight   Take these medicines the morning of surgery with A SIP OF WATER: amlodipine.  DO NOT TAKE ANY ORAL DIABETIC MEDICATIONS DAY OF YOUR SURGERY                              You may not have any metal on your body including hair pins, jewelry, and body piercing             Do not wear  lotions, powders, perfumes/cologne, or deodorant              Men may shave face and neck.   Do not bring valuables to the hospital. [place].   Contacts, dentures  or bridgework may not be worn into surgery.   Bring small overnight bag day of surgery.    Patients discharged on the day of surgery will not be allowed to drive home.  Someone needs to stay with you for the first 24 hours after anesthesia.   Special Instructions: Bring a copy of your healthcare power  of attorney and living will documents         the day of surgery if you haven't scanned them before.              Please read over the following fact sheets you were given: IF YOU HAVE QUESTIONS ABOUT YOUR PRE-OP INSTRUCTIONS PLEASE CALL [phone]     [facility] - Preparing for Surgery Before surgery, you can play an important role.  Because skin is not sterile, your skin needs to be as free of germs as possible.  You can reduce the number of germs on your skin by washing with CHG (chlorahexidine gluconate) soap before surgery.  CHG is an antiseptic cleaner which kills germs and bonds with the skin to continue killing germs even after washing. Please DO NOT use if you have an allergy to CHG or antibacterial soaps.  If your skin becomes reddened/irritated stop using the CHG and inform your nurse when you arrive at Short Stay. Do not shave (including legs and underarms) for at least 48 hours prior to the first CHG shower.  You may shave your face/neck. Please follow these instructions carefully:  1.  Shower with CHG Soap the night before surgery and the  morning of Surgery.  2.  If you choose to wash your hair, wash your hair first as usual with your  normal  shampoo.  3.  After you shampoo, rinse your hair and body thoroughly to remove the  shampoo.                           4.  Use CHG as you would any other liquid soap.  You can apply chg directly  to the skin and wash                       Gently with a scrungie or clean washcloth.  5.  Apply the CHG Soap to your body ONLY FROM THE NECK DOWN.   Do not use on face/ open                           Wound or open sores. Avoid contact with eyes, ears mouth and genitals (private parts).                       Wash face,  Genitals (private parts) with your normal soap.             6.  Wash thoroughly, paying special attention to the area where your surgery  will be performed.  7.  Thoroughly rinse your body with warm water from the neck down.  8.  DO  NOT shower/wash with your normal soap after using and rinsing off  the CHG Soap.                9.  Pat yourself dry with a clean towel.            10.  Wear clean pajamas.  11.  Place clean sheets on your bed the night of your first shower and do not  sleep with pets. Day of Surgery : Do not apply any lotions/deodorants the morning of surgery.  Please wear clean clothes to the hospital/surgery center.  FAILURE TO FOLLOW THESE INSTRUCTIONS MAY RESULT IN THE CANCELLATION OF YOUR SURGERY PATIENT SIGNATURE_________________________________  NURSE SIGNATURE__________________________________  ________________________________________________________________________   Jesus Ramos  An incentive spirometer is a tool that can help keep your lungs clear and active. This tool measures how well you are filling your lungs with each breath. Taking long deep breaths may help reverse or decrease the chance of developing breathing (pulmonary) problems (especially infection) following: A long period of time when you are unable to move or be active. BEFORE THE PROCEDURE  If the spirometer includes an indicator to show your best effort, your nurse or respiratory therapist will set it to a desired goal. If possible, sit up straight or lean slightly forward. Try not to slouch. Hold the incentive spirometer in an upright position. INSTRUCTIONS FOR USE  Sit on the edge of your bed if possible, or sit up as far as you can in bed or on a chair. Hold the incentive spirometer in an upright position. Breathe out normally. Place the mouthpiece in your mouth and seal your lips tightly around it. Breathe in slowly and as deeply as possible, raising the piston or the ball toward the top of the column. Hold your breath for 3-5 seconds or for as long as possible. Allow the piston or ball to fall to the bottom of the column. Remove the mouthpiece from your mouth and breathe out normally. Rest for a few  seconds and repeat Steps 1 through 7 at least 10 times every 1-2 hours when you are awake. Take your time and take a few normal breaths between deep breaths. The spirometer may include an indicator to show your best effort. Use the indicator as a goal to work toward during each repetition. After each set of 10 deep breaths, practice coughing to be sure your lungs are clear. If you have an incision (the cut made at the time of surgery), support your incision when coughing by placing a pillow or rolled up towels firmly against it. Once you are able to get out of bed, walk around indoors and cough well. You may stop using the incentive spirometer when instructed by your caregiver.  RISKS AND COMPLICATIONS Take your time so you do not get dizzy or light-headed. If you are in pain, you may need to take or ask for pain medication before doing incentive spirometry. It is harder to take a deep breath if you are having pain. AFTER USE Rest and breathe slowly and easily. It can be helpful to keep track of a log of your progress. Your caregiver can provide you with a simple table to help with this. If you are using the spirometer at home, follow these instructions: [place] IF:  You are having difficultly using the spirometer. You have trouble using the spirometer as often as instructed. Your pain medication is not giving enough relief while using the spirometer. You develop fever of 100.5 F (38.1 C) or higher. SEEK IMMEDIATE MEDICAL CARE IF:  You cough up bloody sputum that had not been present before. You develop fever of 102 F (38.9 C) or greater. You develop worsening pain at or near the incision site. MAKE SURE YOU:  Understand these instructions. Will watch your condition. Will get  help right away if you are not doing well or get worse. Document Released: 08/15/2006 Document Revised: 06/27/2011 Document Reviewed: 10/16/2006 [facility] Patient Information 2014 [place],  [place].   ________________________________________________________________________

## 2021-05-12 ENCOUNTER — Encounter (HOSPITAL_COMMUNITY)
Admission: RE | Admit: 2021-05-12 | Discharge: 2021-05-12 | Disposition: A | Discharge status: Home / Self Care | Payer: 59 | Source: Ambulatory Visit | Attending: Urology | Admitting: Urology

## 2021-05-12 ENCOUNTER — Other Ambulatory Visit: Payer: Self-pay

## 2021-05-12 ENCOUNTER — Encounter (HOSPITAL_COMMUNITY): Payer: Self-pay

## 2021-05-12 VITALS — HR 62 | Temp 98.0°F | Ht 73.5 in | Wt 320.0 lb

## 2021-05-12 DIAGNOSIS — R7303 Prediabetes: Secondary | ICD-10-CM | POA: Insufficient documentation

## 2021-05-12 DIAGNOSIS — Z01818 Encounter for other preprocedural examination: Secondary | ICD-10-CM

## 2021-05-12 DIAGNOSIS — Z01812 Encounter for preprocedural laboratory examination: Secondary | ICD-10-CM | POA: Insufficient documentation

## 2021-05-12 HISTORY — DX: Malignant (primary) neoplasm, unspecified: C80.1

## 2021-05-12 HISTORY — DX: Prediabetes: R73.03

## 2021-05-12 HISTORY — DX: Other complications of anesthesia, initial encounter: T88.59XA

## 2021-05-12 LAB — BASIC METABOLIC PANEL
Anion gap: 8 (ref 5–15)
BUN: 17 mg/dL (ref 8–23)
CO2: 22 mmol/L (ref 22–32)
Calcium: 9.5 mg/dL (ref 8.9–10.3)
Chloride: 106 mmol/L (ref 98–111)
Creatinine, Ser: 0.87 mg/dL (ref 0.61–1.24)
GFR, Estimated: >60 mL/min (ref >60)
Glucose, Bld: 92 mg/dL (ref 70–99)
Potassium: 4 mmol/L (ref 3.5–5.1)
Sodium: 136 mmol/L (ref 135–145)

## 2021-05-12 LAB — CBC
HCT: 44.7 % (ref 39.0–52.0)
Hemoglobin: 15.1 g/dL (ref 13.0–17.0)
MCH: 30 pg (ref 26.0–34.0)
MCHC: 33.8 g/dL (ref 30.0–36.0)
MCV: 88.9 fL (ref 80.0–100.0)
Platelets: 295 10*3/uL (ref 150–400)
RBC: 5.03 MIL/uL (ref 4.22–5.81)
RDW: 13.9 % (ref 11.5–15.5)
WBC: 8.8 10*3/uL (ref 4.0–10.5)
nRBC: 0 % (ref 0.0–0.2)

## 2021-05-12 LAB — GLUCOSE, CAPILLARY: Glucose-Capillary: 102 mg/dL — ABNORMAL HIGH (ref 70–99)

## 2021-05-12 NOTE — Progress Notes (Addendum)
COVID Vaccine Completed:Yes Date COVID Vaccine completed: 2021 x 3 COVID vaccine manufacturer: 2-Pfizer    1-Moderna    COVID Test: 05/18/21 PCP - Dr. Asencion Noble Cardiologist - Dr. Jacquelynn Cree. LOV: 11/05/20  Chest x-ray -  EKG - 11/05/20 Stress Test -  ECHO - 11/26/18 Cardiac Cath -  Pacemaker/ICD device last checked:  Sleep Study - Yes CPAP - Yes  Fasting Blood Sugar - N/A Checks Blood Sugar _____ times a day  Blood Thinner Instructions: Aspirin Instructions: Last Dose:  Anesthesia review: Hx: HTN,OSA(CPAP),CAD  Patient denies shortness of breath, fever, cough and chest pain at PAT appointment   Patient verbalized understanding of instructions that were given to them at the PAT appointment. Patient was also instructed that they will need to review over the PAT instructions again at home before surgery.

## 2021-05-13 ENCOUNTER — Other Ambulatory Visit: Payer: Self-pay | Admitting: Pulmonary Disease

## 2021-05-13 DIAGNOSIS — G4733 Obstructive sleep apnea (adult) (pediatric): Secondary | ICD-10-CM

## 2021-05-13 LAB — HEMOGLOBIN A1C
Hgb A1c MFr Bld: 6.3 % — ABNORMAL HIGH (ref 4.8–5.6)
Mean Plasma Glucose: 134 mg/dL

## 2021-05-18 ENCOUNTER — Encounter (HOSPITAL_COMMUNITY): Payer: 59

## 2021-05-18 ENCOUNTER — Encounter (HOSPITAL_COMMUNITY)
Admission: RE | Admit: 2021-05-18 | Discharge: 2021-05-18 | Disposition: A | Discharge status: Home / Self Care | Payer: 59 | Source: Ambulatory Visit | Attending: Urology | Admitting: Urology

## 2021-05-18 ENCOUNTER — Other Ambulatory Visit (HOSPITAL_COMMUNITY): Payer: 59

## 2021-05-18 DIAGNOSIS — Z20822 Contact with and (suspected) exposure to covid-19: Secondary | ICD-10-CM | POA: Insufficient documentation

## 2021-05-18 DIAGNOSIS — Z01812 Encounter for preprocedural laboratory examination: Secondary | ICD-10-CM | POA: Insufficient documentation

## 2021-05-18 DIAGNOSIS — Z01818 Encounter for other preprocedural examination: Secondary | ICD-10-CM

## 2021-05-18 LAB — SARS CORONAVIRUS 2 BY RT PCR (HOSPITAL ORDER, PERFORMED IN ~~LOC~~ HOSPITAL LAB): SARS Coronavirus 2: NEGATIVE

## 2021-05-19 NOTE — Anesthesia Preprocedure Evaluation (Addendum)
Anesthesia Evaluation  Patient identified by MRN, date of birth, ID band Patient awake    Reviewed: Allergy & Precautions, NPO status , Patient's Chart, lab work & pertinent test results  History of Anesthesia Complications (+) history of anesthetic complications  Airway Mallampati: II  TM Distance: >3 FB Neck ROM: Full    Dental no notable dental hx. (+) Teeth Intact, Dental Advisory Given,  Missing Veneer  On Front:   Pulmonary sleep apnea and Continuous Positive Airway Pressure Ventilation ,    Pulmonary exam normal breath sounds clear to auscultation       Cardiovascular hypertension, + CAD  Normal cardiovascular exam Rhythm:Regular Rate:Normal     Neuro/Psych negative neurological ROS  negative psych ROS   GI/Hepatic negative GI ROS, Neg liver ROS,   Endo/Other  Morbid obesity (BMI 41.64)  Renal/GU Lab Results      Component                Value               Date                      CREATININE               0.87                05/12/2021                BUN                      17                  05/12/2021                NA                       136                 05/12/2021                K                        4.0                 05/12/2021                CL                       106                 05/12/2021                CO2                      22                  05/12/2021                Musculoskeletal  (+) Arthritis ,   Abdominal (+) + obese,   Peds  Hematology Lab Results      Component                Value               Date  WBC                      8.8                 05/12/2021                HGB                      15.1                05/12/2021                HCT                      44.7                05/12/2021                MCV                      88.9                05/12/2021                PLT                      295                 05/12/2021               Anesthesia Other Findings   Reproductive/Obstetrics                            Anesthesia Physical Anesthesia Plan  ASA: 3  Anesthesia Plan: General   Post-op Pain Management: Dilaudid IV   Induction: Intravenous  PONV Risk Score and Plan: 3 and Treatment may vary due to age or medical condition, Midazolam, Dexamethasone and Ondansetron  Airway Management Planned: Oral ETT  Additional Equipment: None  Intra-op Plan:   Post-operative Plan: Extubation in OR  Informed Consent: I have reviewed the patients History and Physical, chart, labs and discussed the procedure including the risks, benefits and alternatives for the proposed anesthesia with the patient or authorized representative who has indicated his/her understanding and acceptance.     Dental advisory given  Plan Discussed with: CRNA and Anesthesiologist  Anesthesia Plan Comments:         Anesthesia Quick Evaluation

## 2021-05-19 NOTE — H&P (Signed)
Office Visit Report     05/04/2021   --------------------------------------------------------------------------------   Jesus Ramos  MRN: [id number]  DOB: Nov 19, 1955, 66 year old Male  SSN: [id number]   PRIMARY CARE:    REFERRING:  Paula Compton. Claudia Pollock, MD  PROVIDER:  Raynelle Bring, M.D.  TREATING:  [place], [place]  LOCATION:  [facility] [phone]     --------------------------------------------------------------------------------   CC/HPI: Pt presents today for pre-operative history and physical exam in anticipation of robotic assisted lap radical prostatectomy with bilateral pelvic lymph node dissection by Dr. Alinda Money on 05/20/21. He is doing well and is without complaint.   Pt denies F/C, HA, CP, SOB, N/V, diarrhea/constipation, back pain, flank pain, hematuria, and dysuria.    HX:   CC: Prostate Cancer   PCP: Dr. 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 Willey Blade during a routine physical exam with a right sided prostate nodule noted. His PSA was normal but had increased from a baseline leave of 1.1 in 2018 to 1.6 in 2020, 1.7 in 2021, and 2.6 when checked in 2022. I evaluated him in October 2022 and his exam was concerning enough to recommend a biopsy but we did obtain an MRI of the prostate first. This was done on 02/21/21 and indicated a PI-RADS 5 lesion with concern with possible EPE bilaterally (worse left than right) but no SVI, LAD, or bone lesions. An MR/US fusion biopsy was performed on 03/09/21 and confirmed Gleason 4+5=9 adenocarcinoma of the prostate with 4 out of 4 targeted biopsies positive and 4 out of 12 systematic cores positive for a total of 8 out of 16 cores positive.   Family history: None.   Imaging  studies: PSMA PET scan (03/26/21): Negative for metastatic disease.   PMH: He has a history of hyperlipidemia, hypertension, sleep apnea, and psoriasis. He does take Skyrizi 360 mg every 3 months. He also has a history of a high calcium score but underwent a stress test within the past year that was negative and did not require any further testing or treatment.  PSH: No abdominal surgeries.   TNM stage: cT3a N0 M0  PSA: 2.6  Gleason score: 4+5=9 (GG 5)  Biopsy (03/09/21): 8/16 cores positive  Left: L lateral apex (60%, 4+3=7), L apex 70%, 4+4=8), L mid (10%, 4+5=9), L lateral base (20%, 3+4=7)  Right: Benign  Targeted: 4/4 cores (70%, 70%, 40%, 40%, 4+5=9, PNI)  Prostate volume: 40.9 cc   Nomogram  OC disease: 10%  EPE: 88%  SVI: 30%  LNI: 35%  PFS (5 year, 10 year): 40%, 21%   Urinary function: IPSS is 4.  Erectile function: SHIM score is 20.     ALLERGIES: None   MEDICATIONS: Aspirin Ec 81 mg tablet, delayed release tablet  Crestor 20 mg tablet  Amlodipine Besylate 10 mg tablet  Lexapro  Skyrizi  Valsartan 160 mg tablet     GU PSH: Prostate Needle Biopsy - 03/09/2021       PSH Notes: turbinate reduction with deviated septum approx 8 years   Cataract surgery left eye only    NON-GU PSH: Colonoscopy Eye Surgery (Unspecified) Surgical Pathology, Gross And Microscopic Examination For Prostate Needle - 03/09/2021 Tonsillectomy     GU PMH: Prostate Cancer -  04/13/2021 Prostate nodule w/o LUTS - 03/09/2021, - 01/19/2021      PMH Notes: benign colon polyps  kidney stones  diverticultis meds only    NON-GU PMH: Hypercholesterolemia Hypertension Psoriasis, unspecified Sleep Apnea    FAMILY HISTORY: 1 Daughter - Runs in Family 1 son - Runs in Family Hypertension - Runs in Family    Notes: dad with low grade bladder cancer at age 10   SOCIAL HISTORY: Marital Status: Married Preferred Language: English; Ethnicity: Not Hispanic Or Latino; Race: White Current  Smoking Status: Patient has never smoked.   Tobacco Use Assessment Completed: Used Tobacco in last 30 days? Has never drank.  Does not use drugs. Drinks 2 caffeinated drinks per day. Has not had a blood transfusion.    REVIEW OF SYSTEMS:    GU Review Male:   Patient denies frequent urination, hard to postpone urination, burning/ pain with urination, get up at night to urinate, leakage of urine, stream starts and stops, trouble starting your stream, have to strain to urinate , erection problems, and penile pain.  Gastrointestinal (Upper):   Patient denies nausea, vomiting, and indigestion/ heartburn.  Gastrointestinal (Lower):   Patient denies diarrhea and constipation.  Constitutional:   Patient denies fever, night sweats, weight loss, and fatigue.  Skin:   Patient denies skin rash/ lesion and itching.  Eyes:   Patient denies blurred vision and double vision.  Ears/ Nose/ Throat:   Patient denies sore throat and sinus problems.  Hematologic/Lymphatic:   Patient denies swollen glands and easy bruising.  Cardiovascular:   Patient denies leg swelling and chest pains.  Respiratory:   Patient denies shortness of breath and cough.  Endocrine:   Patient denies excessive thirst.  Musculoskeletal:   Patient denies back pain and joint pain.  Neurological:   Patient denies headaches and dizziness.  Psychologic:   Patient denies depression and anxiety.   VITAL SIGNS:      05/04/2021 03:08 PM  Weight 320 lb / 145.15 kg  Height 73.5 in / 186.69 cm  BP 146/76 mmHg  Pulse 76 /min  Temperature 96.9 F / 36.0 C  BMI 41.6 kg/m   MULTI-SYSTEM PHYSICAL EXAMINATION:    Constitutional: Well-nourished. No physical deformities. Normally developed. Good grooming.  Neck: Neck symmetrical, not swollen. Normal tracheal position.  Respiratory: Normal breath sounds. No labored breathing, no use of accessory muscles.   Cardiovascular: Regular rate and rhythm. No murmur, no gallop.   Lymphatic: No enlargement  of neck, axillae, groin.  Skin: No paleness, no jaundice, no cyanosis. No lesion, no ulcer, no rash.  Neurologic / Psychiatric: Oriented to time, oriented to place, oriented to person. No depression, no anxiety, no agitation.  Gastrointestinal: No mass, no tenderness, no rigidity, obese abdomen.   Eyes: Normal conjunctivae. Normal eyelids.  Ears, Nose, Mouth, and Throat: Left ear no scars, no lesions, no masses. Right ear no scars, no lesions, no masses. Nose no scars, no lesions, no masses. Normal hearing. Normal lips.  Musculoskeletal: Normal gait and station of head and neck.     Complexity of Data:  Records Review:   Previous Patient Records  Urine Test Review:   Urinalysis   05/04/21  Urinalysis  Urine Appearance Clear   Urine Color Yellow   Urine Glucose Neg mg/dL  Urine Bilirubin Neg mg/dL  Urine Ketones Neg mg/dL  Urine Specific Gravity 1.020   Urine Blood Neg ery/uL  Urine pH 7.5   Urine Protein Trace mg/dL  Urine Urobilinogen 0.2 mg/dL  Urine Nitrites Neg   Urine Leukocyte Esterase Neg leu/uL   PROCEDURES:          Urinalysis - 81003 Dipstick Dipstick Cont'd  Color: Yellow Bilirubin: Neg mg/dL  Appearance: Clear Ketones: Neg mg/dL  Specific Gravity: 1.020 Blood: Neg ery/uL  pH: 7.5 Protein: Trace mg/dL  Glucose: Neg mg/dL Urobilinogen: 0.2 mg/dL    Nitrites: Neg    Leukocyte Esterase: Neg leu/uL    ASSESSMENT:      ICD-10 Details  1 GU:   Prostate Cancer - C61    PLAN:            Medications Stop Meds: Diazepam 10 mg tablet Take 10 mg 30-60 minutes prior to your procedure  Start: 02/23/2021  Discontinue: 05/04/2021  - Reason: DONE           Schedule Return Visit/Planned Activity: Keep Scheduled Appointment - Schedule Surgery          Document Letter(s):  Created for Patient: Clinical Summary         Notes:   There are no changes in the patients history or physical exam since last evaluation by Dr. Alinda Money. Pt is scheduled to undergo RALP with  BPLND on 05/20/21.   All pt's questions were answered to the best of my ability.          Next Appointment:      Next Appointment: 05/05/2021 03:00 PM    Appointment Type: 66 Physical Therapy    Location: [facility] [phone]    Provider: Nyra Capes    Reason for Visit: initial pt eval- pre op prior to radical prostatectomy      * Signed by Mcarthur Rossetti, PA on 05/04/21 at 4:20 PM (EST)*

## 2021-05-20 ENCOUNTER — Ambulatory Visit (HOSPITAL_COMMUNITY): Payer: 59 | Admitting: Physician Assistant

## 2021-05-20 ENCOUNTER — Observation Stay (HOSPITAL_COMMUNITY)
Admission: RE | Admit: 2021-05-20 | Discharge: 2021-05-21 | Disposition: A | Discharge status: Home / Self Care | Payer: 59 | Source: Ambulatory Visit | Attending: Urology | Admitting: Urology

## 2021-05-20 ENCOUNTER — Other Ambulatory Visit: Payer: Self-pay

## 2021-05-20 ENCOUNTER — Encounter (HOSPITAL_COMMUNITY): Payer: Self-pay | Admitting: Urology

## 2021-05-20 ENCOUNTER — Ambulatory Visit (HOSPITAL_COMMUNITY): Payer: 59 | Admitting: Anesthesiology

## 2021-05-20 ENCOUNTER — Encounter (HOSPITAL_COMMUNITY)
Admission: RE | Disposition: A | Discharge status: Home / Self Care | Payer: Self-pay | Source: Ambulatory Visit | Attending: Urology

## 2021-05-20 DIAGNOSIS — C61 Malignant neoplasm of prostate: Principal | ICD-10-CM | POA: Diagnosis present

## 2021-05-20 DIAGNOSIS — I2510 Atherosclerotic heart disease of native coronary artery without angina pectoris: Secondary | ICD-10-CM | POA: Diagnosis not present

## 2021-05-20 DIAGNOSIS — Z7982 Long term (current) use of aspirin: Secondary | ICD-10-CM | POA: Diagnosis not present

## 2021-05-20 DIAGNOSIS — G4733 Obstructive sleep apnea (adult) (pediatric): Secondary | ICD-10-CM | POA: Diagnosis not present

## 2021-05-20 DIAGNOSIS — I10 Essential (primary) hypertension: Secondary | ICD-10-CM | POA: Insufficient documentation

## 2021-05-20 DIAGNOSIS — Z9989 Dependence on other enabling machines and devices: Secondary | ICD-10-CM | POA: Diagnosis not present

## 2021-05-20 HISTORY — PX: LYMPHADENECTOMY: SHX5960

## 2021-05-20 HISTORY — PX: ROBOT ASSISTED LAPAROSCOPIC RADICAL PROSTATECTOMY: SHX5141

## 2021-05-20 LAB — HEMOGLOBIN AND HEMATOCRIT, BLOOD
HCT: 45.8 % (ref 39.0–52.0)
Hemoglobin: 15.3 g/dL (ref 13.0–17.0)

## 2021-05-20 LAB — TYPE AND SCREEN
ABO/RH(D): O NEG
Antibody Screen: NEGATIVE

## 2021-05-20 LAB — ABO/RH: ABO/RH(D): O NEG

## 2021-05-20 SURGERY — XI ROBOTIC ASSISTED LAPAROSCOPIC RADICAL PROSTATECTOMY LEVEL 2
Anesthesia: General

## 2021-05-20 MED ORDER — CEFAZOLIN IN SODIUM CHLORIDE 3-0.9 GM/100ML-% IV SOLN
3.0000 g | Freq: Once | INTRAVENOUS | Status: AC
  Administered 2021-05-20: 3 g via INTRAVENOUS
  Filled 2021-05-20: qty 100

## 2021-05-20 MED ORDER — MIDAZOLAM HCL 5 MG/5ML IJ SOLN
INTRAMUSCULAR | Status: DC | PRN
  Administered 2021-05-20: 2 mg via INTRAVENOUS

## 2021-05-20 MED ORDER — ORAL CARE MOUTH RINSE: 15.0000 mL | Freq: Once | OROMUCOSAL | Status: AC

## 2021-05-20 MED ORDER — ONDANSETRON HCL 4 MG/2ML IJ SOLN: 4.0000 mg | Freq: Once | INTRAMUSCULAR | Status: DC | PRN

## 2021-05-20 MED ORDER — BUPIVACAINE-EPINEPHRINE 0.25% -1:200000 IJ SOLN
INTRAMUSCULAR | Status: AC
  Filled 2021-05-20: qty 1

## 2021-05-20 MED ORDER — OXYCODONE HCL 5 MG PO TABS: 5.0000 mg | ORAL_TABLET | Freq: Once | ORAL | Status: AC | PRN

## 2021-05-20 MED ORDER — ONDANSETRON HCL 4 MG/2ML IJ SOLN
INTRAMUSCULAR | Status: DC | PRN
  Administered 2021-05-20: 4 mg via INTRAVENOUS

## 2021-05-20 MED ORDER — DOCUSATE SODIUM 100 MG PO CAPS
100.0000 mg | ORAL_CAPSULE | Freq: Two times a day (BID) | ORAL | Status: DC
Start: 2021-05-20 — End: 2022-06-13

## 2021-05-20 MED ORDER — CEFAZOLIN SODIUM-DEXTROSE 1-4 GM/50ML-% IV SOLN
1.0000 g | Freq: Three times a day (TID) | INTRAVENOUS | Status: AC
  Administered 2021-05-20 – 2021-05-21 (×2): 1 g via INTRAVENOUS
  Filled 2021-05-20 (×2): qty 50

## 2021-05-20 MED ORDER — PHENYLEPHRINE HCL-NACL 20-0.9 MG/250ML-% IV SOLN
INTRAVENOUS | Status: DC | PRN
Start: 2021-05-20 — End: 2021-05-20
  Administered 2021-05-20: 25 ug/min via INTRAVENOUS

## 2021-05-20 MED ORDER — BACITRACIN-NEOMYCIN-POLYMYXIN 400-5-5000 EX OINT: 1.0000 "application " | TOPICAL_OINTMENT | Freq: Three times a day (TID) | CUTANEOUS | Status: DC | PRN

## 2021-05-20 MED ORDER — MORPHINE SULFATE (PF) 2 MG/ML IV SOLN
INTRAVENOUS | Status: AC
  Administered 2021-05-20: 2 mg via INTRAVENOUS
  Filled 2021-05-20: qty 1

## 2021-05-20 MED ORDER — HYDROMORPHONE HCL 1 MG/ML IJ SOLN
0.2500 mg | INTRAMUSCULAR | Status: DC | PRN
  Administered 2021-05-20 (×3): 0.5 mg via INTRAVENOUS

## 2021-05-20 MED ORDER — BELLADONNA ALKALOIDS-OPIUM 16.2-60 MG RE SUPP
RECTAL | Status: AC
  Administered 2021-05-20: 1 via RECTAL
  Filled 2021-05-20: qty 1

## 2021-05-20 MED ORDER — ONDANSETRON HCL 4 MG/2ML IJ SOLN: 4.0000 mg | INTRAMUSCULAR | Status: DC | PRN

## 2021-05-20 MED ORDER — DEXMEDETOMIDINE (PRECEDEX) IN NS 20 MCG/5ML (4 MCG/ML) IV SYRINGE
PREFILLED_SYRINGE | INTRAVENOUS | Status: DC | PRN
  Administered 2021-05-20: 16 ug via INTRAVENOUS
  Administered 2021-05-20: 4 ug via INTRAVENOUS

## 2021-05-20 MED ORDER — DEXAMETHASONE SODIUM PHOSPHATE 10 MG/ML IJ SOLN
INTRAMUSCULAR | Status: DC | PRN
Start: 2021-05-20 — End: 2021-05-20
  Administered 2021-05-20: 6 mg via INTRAVENOUS

## 2021-05-20 MED ORDER — LACTATED RINGERS IV SOLN
INTRAVENOUS | Status: DC | PRN
  Administered 2021-05-20: 1001 mL

## 2021-05-20 MED ORDER — FLEET ENEMA 7-19 GM/118ML RE ENEM: 1.0000 | ENEMA | Freq: Once | RECTAL | Status: DC

## 2021-05-20 MED ORDER — MORPHINE SULFATE (PF) 2 MG/ML IV SOLN
2.0000 mg | INTRAVENOUS | Status: DC | PRN
  Administered 2021-05-20 – 2021-05-21 (×4): 2 mg via INTRAVENOUS
  Filled 2021-05-20 (×4): qty 1

## 2021-05-20 MED ORDER — ROCURONIUM BROMIDE 10 MG/ML (PF) SYRINGE
PREFILLED_SYRINGE | INTRAVENOUS | Status: AC
  Filled 2021-05-20: qty 10

## 2021-05-20 MED ORDER — KETOROLAC TROMETHAMINE 15 MG/ML IJ SOLN
15.0000 mg | Freq: Four times a day (QID) | INTRAMUSCULAR | Status: DC
  Administered 2021-05-20 – 2021-05-21 (×3): 15 mg via INTRAVENOUS
  Filled 2021-05-20 (×3): qty 1

## 2021-05-20 MED ORDER — SUGAMMADEX SODIUM 500 MG/5ML IV SOLN
INTRAVENOUS | Status: AC
  Filled 2021-05-20: qty 5

## 2021-05-20 MED ORDER — SUGAMMADEX SODIUM 500 MG/5ML IV SOLN
INTRAVENOUS | Status: DC | PRN
  Administered 2021-05-20: 500 mg via INTRAVENOUS

## 2021-05-20 MED ORDER — STERILE WATER FOR IRRIGATION IR SOLN
Status: DC | PRN
  Administered 2021-05-20: 1000 mL

## 2021-05-20 MED ORDER — ESCITALOPRAM OXALATE 20 MG PO TABS
20.0000 mg | ORAL_TABLET | Freq: Every day | ORAL | Status: DC
  Administered 2021-05-20 – 2021-05-21 (×2): 20 mg via ORAL
  Filled 2021-05-20 (×2): qty 1

## 2021-05-20 MED ORDER — EPHEDRINE 5 MG/ML INJ
INTRAVENOUS | Status: AC
  Filled 2021-05-20: qty 10

## 2021-05-20 MED ORDER — OXYCODONE HCL 5 MG PO TABS
ORAL_TABLET | ORAL | Status: AC
  Administered 2021-05-20: 5 mg via ORAL
  Filled 2021-05-20: qty 1

## 2021-05-20 MED ORDER — TRAMADOL HCL 50 MG PO TABS: 50.0000 mg | ORAL_TABLET | Freq: Four times a day (QID) | ORAL | 0 refills | Status: DC | PRN

## 2021-05-20 MED ORDER — FENTANYL CITRATE (PF) 250 MCG/5ML IJ SOLN
INTRAMUSCULAR | Status: AC
  Filled 2021-05-20: qty 5

## 2021-05-20 MED ORDER — BUPIVACAINE-EPINEPHRINE 0.25% -1:200000 IJ SOLN
INTRAMUSCULAR | Status: DC | PRN
  Administered 2021-05-20: 40 mL
  Administered 2021-05-20: 10 mL

## 2021-05-20 MED ORDER — MIDAZOLAM HCL 2 MG/2ML IJ SOLN
INTRAMUSCULAR | Status: AC
  Filled 2021-05-20: qty 2

## 2021-05-20 MED ORDER — SULFAMETHOXAZOLE-TRIMETHOPRIM 800-160 MG PO TABS
1.0000 | ORAL_TABLET | Freq: Two times a day (BID) | ORAL | 0 refills | Status: DC
Start: 2021-05-20 — End: 2022-06-13

## 2021-05-20 MED ORDER — HYDROMORPHONE HCL 1 MG/ML IJ SOLN
INTRAMUSCULAR | Status: AC
  Administered 2021-05-20: 0.5 mg via INTRAVENOUS
  Filled 2021-05-20: qty 2

## 2021-05-20 MED ORDER — ACETAMINOPHEN 325 MG PO TABS: 650.0000 mg | ORAL_TABLET | ORAL | Status: DC | PRN

## 2021-05-20 MED ORDER — HEPARIN SODIUM (PORCINE) 1000 UNIT/ML IJ SOLN
INTRAMUSCULAR | Status: AC
  Filled 2021-05-20: qty 1

## 2021-05-20 MED ORDER — LIDOCAINE 2% (20 MG/ML) 5 ML SYRINGE
INTRAMUSCULAR | Status: DC | PRN
  Administered 2021-05-20: 1.5 mg/kg/h via INTRAVENOUS
  Administered 2021-05-20: 80 mg via INTRAVENOUS

## 2021-05-20 MED ORDER — SODIUM CHLORIDE 0.9 % IR SOLN
Status: DC | PRN
  Administered 2021-05-20: 1000 mL via INTRAVESICAL

## 2021-05-20 MED ORDER — LACTATED RINGERS IV SOLN: INTRAVENOUS | Status: DC | PRN

## 2021-05-20 MED ORDER — ACETAMINOPHEN 10 MG/ML IV SOLN
INTRAVENOUS | Status: AC
  Filled 2021-05-20: qty 100

## 2021-05-20 MED ORDER — HYDROMORPHONE HCL 1 MG/ML IJ SOLN
INTRAMUSCULAR | Status: DC | PRN
  Administered 2021-05-20 (×2): 1 mg via INTRAVENOUS

## 2021-05-20 MED ORDER — KCL IN DEXTROSE-NACL 20-5-0.45 MEQ/L-%-% IV SOLN
INTRAVENOUS | Status: DC
  Filled 2021-05-20 (×2): qty 1000

## 2021-05-20 MED ORDER — BELLADONNA ALKALOIDS-OPIUM 16.2-60 MG RE SUPP: 1.0000 | Freq: Four times a day (QID) | RECTAL | Status: DC | PRN

## 2021-05-20 MED ORDER — POLYETHYLENE GLYCOL 3350 17 G PO PACK: 17.0000 g | PACK | Freq: Every day | ORAL | Status: DC

## 2021-05-20 MED ORDER — OXYCODONE HCL 5 MG/5ML PO SOLN: 5.0000 mg | Freq: Once | ORAL | Status: AC | PRN

## 2021-05-20 MED ORDER — AMISULPRIDE (ANTIEMETIC) 5 MG/2ML IV SOLN: 10.0000 mg | Freq: Once | INTRAVENOUS | Status: DC | PRN

## 2021-05-20 MED ORDER — DIPHENHYDRAMINE HCL 12.5 MG/5ML PO ELIX: 12.5000 mg | ORAL_SOLUTION | Freq: Four times a day (QID) | ORAL | Status: DC | PRN

## 2021-05-20 MED ORDER — FENTANYL CITRATE (PF) 100 MCG/2ML IJ SOLN
INTRAMUSCULAR | Status: DC | PRN
  Administered 2021-05-20: 100 ug via INTRAVENOUS
  Administered 2021-05-20: 50 ug via INTRAVENOUS
  Administered 2021-05-20: 100 ug via INTRAVENOUS

## 2021-05-20 MED ORDER — CHLORHEXIDINE GLUCONATE 0.12 % MT SOLN
15.0000 mL | Freq: Once | OROMUCOSAL | Status: AC
  Administered 2021-05-20: 15 mL via OROMUCOSAL

## 2021-05-20 MED ORDER — ROCURONIUM BROMIDE 10 MG/ML (PF) SYRINGE
PREFILLED_SYRINGE | INTRAVENOUS | Status: DC | PRN
  Administered 2021-05-20: 80 mg via INTRAVENOUS
  Administered 2021-05-20 (×2): 20 mg via INTRAVENOUS

## 2021-05-20 MED ORDER — PROPOFOL 10 MG/ML IV BOLUS
INTRAVENOUS | Status: DC | PRN
  Administered 2021-05-20: 150 mg via INTRAVENOUS

## 2021-05-20 MED ORDER — DOCUSATE SODIUM 100 MG PO CAPS
100.0000 mg | ORAL_CAPSULE | Freq: Two times a day (BID) | ORAL | Status: DC
  Administered 2021-05-20 – 2021-05-21 (×2): 100 mg via ORAL
  Filled 2021-05-20 (×2): qty 1

## 2021-05-20 MED ORDER — ACETAMINOPHEN 10 MG/ML IV SOLN
1000.0000 mg | Freq: Once | INTRAVENOUS | Status: DC | PRN
  Administered 2021-05-20: 1000 mg via INTRAVENOUS

## 2021-05-20 MED ORDER — LACTATED RINGERS IV SOLN: INTRAVENOUS | Status: DC

## 2021-05-20 MED ORDER — AMLODIPINE BESYLATE 10 MG PO TABS
10.0000 mg | ORAL_TABLET | Freq: Every day | ORAL | Status: DC
  Administered 2021-05-21: 10 mg via ORAL
  Filled 2021-05-20: qty 1

## 2021-05-20 MED ORDER — ZOLPIDEM TARTRATE 5 MG PO TABS: 5.0000 mg | ORAL_TABLET | Freq: Every evening | ORAL | Status: DC | PRN

## 2021-05-20 MED ORDER — LIDOCAINE HCL (PF) 2 % IJ SOLN
INTRAMUSCULAR | Status: AC
  Filled 2021-05-20: qty 10

## 2021-05-20 MED ORDER — HYDROMORPHONE HCL 2 MG/ML IJ SOLN
INTRAMUSCULAR | Status: AC
  Filled 2021-05-20: qty 1

## 2021-05-20 MED ORDER — EPHEDRINE SULFATE-NACL 50-0.9 MG/10ML-% IV SOSY
PREFILLED_SYRINGE | INTRAVENOUS | Status: DC | PRN
  Administered 2021-05-20 (×2): 5 mg via INTRAVENOUS

## 2021-05-20 MED ORDER — SODIUM CHLORIDE 0.9 % IV BOLUS
1000.0000 mL | Freq: Once | INTRAVENOUS | Status: AC
  Administered 2021-05-20: 1000 mL via INTRAVENOUS

## 2021-05-20 MED ORDER — ROSUVASTATIN CALCIUM 20 MG PO TABS
20.0000 mg | ORAL_TABLET | Freq: Every day | ORAL | Status: DC
  Administered 2021-05-20: 20 mg via ORAL
  Filled 2021-05-20: qty 1

## 2021-05-20 MED ORDER — DIPHENHYDRAMINE HCL 50 MG/ML IJ SOLN: 12.5000 mg | Freq: Four times a day (QID) | INTRAMUSCULAR | Status: DC | PRN

## 2021-05-20 SURGICAL SUPPLY — 79 items
ADH SKN CLS APL DERMABOND .7 (GAUZE/BANDAGES/DRESSINGS) ×2
APL PRP STRL LF DISP 70% ISPRP (MISCELLANEOUS) ×2
APL SWBSTK 6 STRL LF DISP (MISCELLANEOUS) ×2
APPLICATOR COTTON TIP 6 STRL (MISCELLANEOUS) ×2 IMPLANT
APPLICATOR COTTON TIP 6IN STRL (MISCELLANEOUS) ×3
BAG COUNTER SPONGE SURGICOUNT (BAG) IMPLANT
BAG SPNG CNTER NS LX DISP (BAG)
CATH FOLEY 2WAY SLVR 18FR 30CC (CATHETERS) ×3 IMPLANT
CATH ROBINSON RED A/P 16FR (CATHETERS) ×3 IMPLANT
CATH ROBINSON RED A/P 8FR (CATHETERS) ×3 IMPLANT
CATH TIEMANN FOLEY 18FR 5CC (CATHETERS) ×3 IMPLANT
CHLORAPREP W/TINT 26 (MISCELLANEOUS) ×3 IMPLANT
CLIP LIGATING HEM O LOK PURPLE (MISCELLANEOUS) ×6 IMPLANT
COVER SURGICAL LIGHT HANDLE (MISCELLANEOUS) ×3 IMPLANT
COVER TIP SHEARS 8 DVNC (MISCELLANEOUS) ×2 IMPLANT
COVER TIP SHEARS 8MM DA VINCI (MISCELLANEOUS) ×3
CUTTER ECHEON FLEX ENDO 45 340 (ENDOMECHANICALS) ×3 IMPLANT
DERMABOND ADVANCED (GAUZE/BANDAGES/DRESSINGS) ×1
DERMABOND ADVANCED .7 DNX12 (GAUZE/BANDAGES/DRESSINGS) ×2 IMPLANT
DRAIN CHANNEL RND F F (WOUND CARE) IMPLANT
DRAPE ARM DVNC X/XI (DISPOSABLE) ×8 IMPLANT
DRAPE COLUMN DVNC XI (DISPOSABLE) ×2 IMPLANT
DRAPE DA VINCI XI ARM (DISPOSABLE) ×12
DRAPE DA VINCI XI COLUMN (DISPOSABLE) ×3
DRAPE SURG IRRIG POUCH 19X23 (DRAPES) ×3 IMPLANT
DRSG TEGADERM 4X4.75 (GAUZE/BANDAGES/DRESSINGS) ×3 IMPLANT
ELECT PENCIL ROCKER SW 15FT (MISCELLANEOUS) ×3 IMPLANT
ELECT REM PT RETURN 15FT ADLT (MISCELLANEOUS) ×3 IMPLANT
GAUZE 4X4 16PLY ~~LOC~~+RFID DBL (SPONGE) ×3 IMPLANT
GAUZE SPONGE 4X4 12PLY STRL (GAUZE/BANDAGES/DRESSINGS) ×3 IMPLANT
GLOVE SRG 8 PF TXTR STRL LF DI (GLOVE) IMPLANT
GLOVE SURG ENC MOIS LTX SZ6.5 (GLOVE) ×3 IMPLANT
GLOVE SURG ENC TEXT LTX SZ7.5 (GLOVE) ×6 IMPLANT
GLOVE SURG POLYISO LF SZ6.5 (GLOVE) ×1 IMPLANT
GLOVE SURG POLYISO LF SZ7 (GLOVE) ×2 IMPLANT
GLOVE SURG POLYISO LF SZ8 (GLOVE) ×1 IMPLANT
GLOVE SURG UNDER POLY LF SZ6 (GLOVE) ×4 IMPLANT
GLOVE SURG UNDER POLY LF SZ6.5 (GLOVE) ×1 IMPLANT
GLOVE SURG UNDER POLY LF SZ8 (GLOVE) ×3
GOWN STRL REUS W/ TWL LRG LVL3 (GOWN DISPOSABLE) IMPLANT
GOWN STRL REUS W/TWL LRG LVL3 (GOWN DISPOSABLE) ×14 IMPLANT
GOWN STRL REUS W/TWL XL LVL3 (GOWN DISPOSABLE) ×1 IMPLANT
HOLDER FOLEY CATH W/STRAP (MISCELLANEOUS) ×3 IMPLANT
IRRIG SUCT STRYKERFLOW 2 WTIP (MISCELLANEOUS) ×3
IRRIGATION SUCT STRKRFLW 2 WTP (MISCELLANEOUS) ×2 IMPLANT
IV LACTATED RINGERS 1000ML (IV SOLUTION) ×3 IMPLANT
IV NS 1000ML (IV SOLUTION) ×3
IV NS 1000ML BAXH (IV SOLUTION) IMPLANT
KIT TURNOVER KIT A (KITS) IMPLANT
NDL SAFETY ECLIPSE 18X1.5 (NEEDLE) ×2 IMPLANT
NEEDLE HYPO 18GX1.5 SHARP (NEEDLE) ×3
PACK ROBOT UROLOGY CUSTOM (CUSTOM PROCEDURE TRAY) ×3 IMPLANT
PENCIL SMOKE EVACUATOR (MISCELLANEOUS) IMPLANT
RELOAD STAPLE 45 4.1 GRN THCK (STAPLE) ×2 IMPLANT
SEAL CANN UNIV 5-8 DVNC XI (MISCELLANEOUS) ×8 IMPLANT
SEAL XI 5MM-8MM UNIVERSAL (MISCELLANEOUS) ×12
SET IRRIG Y TYPE TUR BLADDER L (SET/KITS/TRAYS/PACK) ×1 IMPLANT
SET TUBE SMOKE EVAC HIGH FLOW (TUBING) ×3 IMPLANT
SOL PREP POV-IOD 4OZ 10% (MISCELLANEOUS) ×1 IMPLANT
SOLUTION ELECTROLUBE (MISCELLANEOUS) ×3 IMPLANT
SPIKE FLUID TRANSFER (MISCELLANEOUS) ×3 IMPLANT
STAPLE RELOAD 45 GRN (STAPLE) ×2 IMPLANT
STAPLE RELOAD 45MM GREEN (STAPLE) ×3
SUT ETHILON 3 0 PS 1 (SUTURE) ×3 IMPLANT
SUT MNCRL 3 0 RB1 (SUTURE) ×2 IMPLANT
SUT MNCRL 3 0 VIOLET RB1 (SUTURE) ×2 IMPLANT
SUT MNCRL AB 4-0 PS2 18 (SUTURE) ×6 IMPLANT
SUT MONOCRYL 3 0 RB1 (SUTURE) ×6
SUT PDS PLUS 0 (SUTURE) ×6
SUT PDS PLUS AB 0 CT-2 (SUTURE) ×4 IMPLANT
SUT VIC AB 0 CT1 27 (SUTURE) ×3
SUT VIC AB 0 CT1 27XBRD ANTBC (SUTURE) ×2 IMPLANT
SUT VIC AB 0 UR5 27 (SUTURE) ×3 IMPLANT
SUT VIC AB 2-0 SH 27 (SUTURE) ×3
SUT VIC AB 2-0 SH 27X BRD (SUTURE) ×2 IMPLANT
SYR 27GX1/2 1ML LL SAFETY (SYRINGE) ×3 IMPLANT
TOWEL OR NON WOVEN STRL DISP B (DISPOSABLE) ×3 IMPLANT
TROCAR XCEL NON-BLD 5MMX100MML (ENDOMECHANICALS) IMPLANT
WATER STERILE IRR 1000ML POUR (IV SOLUTION) ×3 IMPLANT

## 2021-05-20 NOTE — Interval H&P Note (Signed)
History and Physical Interval Note:  05/20/2021 10:46 AM  Jesus Richards, MD  has presented today for surgery, with the diagnosis of PROSTATE CANCER.  The various methods of treatment have been discussed with the patient and family. After consideration of risks, benefits and other options for treatment, the patient has consented to  Procedure(s): XI ROBOTIC ASSISTED LAPAROSCOPIC RADICAL PROSTATECTOMY LEVEL 2 (N/A) LYMPHADENECTOMY, PELVIC (Bilateral) as a surgical intervention.  The patient's history has been reviewed, patient examined, no change in status, stable for surgery.  I have reviewed the patient's chart and labs.  Questions were answered to the patient's satisfaction.     [employer]

## 2021-05-20 NOTE — Op Note (Signed)
Preoperative diagnosis: Clinically localized adenocarcinoma of the prostate (clinical stage T1c N0 M0)  Postoperative diagnosis: Clinically localized adenocarcinoma of the prostate (clinical stage T1c N0 M0)  Procedure:  Robotic assisted laparoscopic radical prostatectomy (bilateral nerve sparing) Non robotic assisted laparoscopic pelvic lymphadenectomy  Surgeon: [name]. M.D.  Assistant: Debbrah Alar, PA-C  An assistant was required for this surgical procedure.  The duties of the assistant included but were not limited to suctioning, passing suture, camera manipulation, retraction. This procedure would not be able to be performed without an Environmental consultant.  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  After a thorough review of the management options for treatment of prostate cancer, he elected to proceed with surgical therapy and the above procedure(s).  We have discussed the potential benefits and risks of the procedure, side effects of the proposed treatment, the likelihood of the patient achieving the goals of the procedure, and any potential problems that might occur during the procedure or recuperation. Informed consent has been obtained.  Description of procedure:  The patient was taken to the operating room and a general anesthetic was administered. He was given preoperative antibiotics, placed in the dorsal lithotomy position, and prepped and draped in the usual sterile fashion. Next a preoperative timeout was performed. A urethral catheter was placed into the bladder and a site was selected near the umbilicus for placement of the camera  port. This was placed using a standard open Hassan technique which allowed entry into the peritoneal cavity under direct vision and without difficulty. An 8 mm robotic port was placed and a pneumoperitoneum established. The camera was then used to inspect the abdomen and there was no evidence of any intra-abdominal injuries or other abnormalities. The remaining abdominal ports were then placed. 8 mm robotic ports were placed in the right lower quadrant, left lower quadrant, and far left lateral abdominal wall. A 5 mm port was placed in the right upper quadrant and a 12 mm port was placed in the right lateral abdominal wall for laparoscopic assistance. All ports were placed under direct vision without difficulty. The surgical cart was then docked.   Utilizing the cautery scissors, the bladder was reflected posteriorly allowing entry into the space of Retzius and identification of the endopelvic fascia and prostate. The periprostatic fat was then removed from the prostate allowing full exposure of the endopelvic fascia. The endopelvic fascia was then incised from the apex back to the base of the prostate bilaterally and the underlying levator muscle fibers were swept laterally off the prostate thereby isolating the dorsal venous complex. The dorsal vein was then stapled and divided with a 45 mm Flex Echelon stapler. Attention then turned to the bladder neck which was divided anteriorly thereby allowing entry into the bladder and exposure of the urethral catheter. The catheter balloon was deflated and the catheter was brought into the operative field and used to retract the prostate anteriorly. The posterior bladder neck was then examined and was divided allowing further dissection between the bladder and prostate posteriorly until the vasa deferentia and seminal vessels were identified. The vasa deferentia were isolated, divided, and lifted anteriorly. The seminal vesicles were dissected down  to their tips with care  to control the seminal vascular arterial blood supply. These structures were then lifted anteriorly and the space between Denonvilliers fascia and the anterior rectum was developed with a combination of sharp and blunt dissection. This isolated the vascular pedicles of the prostate.  A wide non-nerve sparing dissection was performed bilaterally. The vascular pedicles of the prostate were then ligated with Weck clips between the prostate and divided with sharp cold scissor dissection.  The urethra was then sharply transected allowing the prostate specimen to be disarticulated. The pelvis was copiously irrigated and hemostasis was ensured. There was no evidence for rectal injury.  Attention then turned to the right pelvic sidewall. The fibrofatty tissue between the external iliac vein, confluence of the iliac vessels, hypogastric artery, and Cooper's ligament was dissected free from the pelvic sidewall with care to preserve the obturator nerve. Weck clips were used for lymphostasis and hemostasis. An identical procedure was performed on the contralateral side and the lymphatic packets were removed for permanent pathologic analysis.  Attention then turned to the urethral anastomosis. A 2-0 Vicryl slip knot was placed between Denonvilliers fascia, the posterior bladder neck, and the posterior urethra to reapproximate these structures. A double-armed 3-0 Monocryl suture was then used to perform a 360 running tension-free anastomosis between the bladder neck and urethra. A new urethral catheter was then placed into the bladder and irrigated. There were no blood clots within the bladder and the anastomosis appeared to be watertight. A #19 Blake drain was then brought through the left lateral 8 mm port site and positioned appropriately within the pelvis. It was secured to the skin with a nylon suture. The surgical cart was then undocked. The right lateral 12 mm port site was closed at the fascial level with a 0  Vicryl suture placed laparoscopically. All remaining ports were then removed under direct vision. The prostate specimen was removed intact within the Endopouch retrieval bag via the periumbilical camera port site. This fascial opening was closed with two running 0 PDS sutures. 0.25% Marcaine was then injected into all port sites and all incisions were reapproximated at the skin level with 4-0 Monocryl subcuticular sutures and Dermabond. The patient appeared to tolerate the procedure well and without complications. The patient was able to be extubated and transferred to the recovery unit in satisfactory condition.   [name] MD

## 2021-05-20 NOTE — Discharge Instructions (Signed)

## 2021-05-20 NOTE — Anesthesia Postprocedure Evaluation (Signed)
Anesthesia Post Note  Patient: Jesus Richards, MD  Procedure(s) Performed: XI ROBOTIC ASSISTED LAPAROSCOPIC RADICAL PROSTATECTOMY LEVEL 2 LYMPHADENECTOMY, PELVIC (Bilateral)     Patient location during evaluation: PACU Anesthesia Type: General Level of consciousness: awake and alert Pain management: pain level controlled Vital Signs Assessment: post-procedure vital signs reviewed and stable Respiratory status: spontaneous breathing, nonlabored ventilation, respiratory function stable and patient connected to nasal cannula oxygen Cardiovascular status: blood pressure returned to baseline and stable Postop Assessment: no apparent nausea or vomiting Anesthetic complications: no   No notable events documented.  Last Vitals:  Vitals:   05/20/21 1630 05/20/21 1700  BP: (!) 153/80 132/90  Pulse: 75 83  Resp: 12 16  Temp:    SpO2: 93% 97%    Last Pain:  Vitals:   05/20/21 1700  PainSc: 0-No pain                 Barnet Glasgow

## 2021-05-20 NOTE — Anesthesia Procedure Notes (Addendum)
Procedure Name: Intubation Date/Time: 05/20/2021 12:10 PM Performed by: Lavina Hamman, CRNA Pre-anesthesia Checklist: Patient identified, Emergency Drugs available, Suction available, Patient being monitored and Timeout performed Patient Re-evaluated:Patient Re-evaluated prior to induction Oxygen Delivery Method: Circle system utilized Preoxygenation: Pre-oxygenation with 100% oxygen Induction Type: IV induction Ventilation: Mask ventilation without difficulty, Two handed mask ventilation required and Oral airway inserted - appropriate to patient size Laryngoscope Size: Mac and 4 Grade View: Grade I Tube type: Oral Tube size: 7.0 mm Number of attempts: 1 Airway Equipment and Method: Stylet Placement Confirmation: ETT inserted through vocal cords under direct vision, positive ETCO2, CO2 detector and breath sounds checked- equal and bilateral Secured at: 23 cm Tube secured with: Tape Dental Injury: Teeth and Oropharynx as per pre-operative assessment  Comments: ATOI, mask difficult due to beard and large neck.

## 2021-05-20 NOTE — Progress Notes (Signed)
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   MRN: [id number]  Post-op note  Subjective: The patient is doing well.  No complaints except for bladder spasms.  Objective: Vital signs in last 24 hours: Temp:  [98 F (36.7 C)-98.5 F (36.9 C)] 98.5 F (36.9 C) (02/02 1435) Pulse Rate:  [69-74] 69 (02/02 1500) Resp:  [11-18] 11 (02/02 1500) BP: (141-161)/(74-81) 161/81 (02/02 1500) SpO2:  [98 %-99 %] 98 % (02/02 1435) Weight:  [145.2 kg] 145.2 kg (02/02 0937)  Intake/Output from previous day: No intake/output data recorded. Intake/Output this shift: Total I/O In: 500 [I.V.:500] Out: 150 [Blood:150]  Physical Exam:  General: Alert and oriented. Abdomen: Soft, Nondistended. Incisions: Clean and dry. GU: Urine clearing.  Lab Results: No results for input(s): HGB, HCT in the last 72 hours.  Assessment/Plan: POD#0   1) Continue to monitor, ambualte, IS   [name]. MD   LOS: 0 days   [name] 05/20/2021, 3:24 PM

## 2021-05-20 NOTE — Progress Notes (Signed)
Pt refused cpap for the night.  

## 2021-05-20 NOTE — Transfer of Care (Signed)
Immediate Anesthesia Transfer of Care Note  Patient: Jesus Richards, MD  Procedure(s) Performed: Procedure(s): XI ROBOTIC ASSISTED LAPAROSCOPIC RADICAL PROSTATECTOMY LEVEL 2 (N/A) LYMPHADENECTOMY, PELVIC (Bilateral)  Patient Location: PACU  Anesthesia Type:General  Level of Consciousness:  sedated, patient cooperative and responds to stimulation  Airway & Oxygen Therapy:Patient Spontanous Breathing and Patient connected to face mask oxgen  Post-op Assessment:  Report given to PACU RN and Post -op Vital signs reviewed and stable  Post vital signs:  Reviewed and stable  Last Vitals:  Vitals:   05/20/21 0937  BP: (!) 144/75  Pulse: 74  Resp: 18  Temp: 36.7 C  SpO2: 53%    Complications: No apparent anesthesia complications

## 2021-05-21 ENCOUNTER — Encounter (HOSPITAL_COMMUNITY): Payer: Self-pay | Admitting: Urology

## 2021-05-21 DIAGNOSIS — C61 Malignant neoplasm of prostate: Secondary | ICD-10-CM | POA: Diagnosis not present

## 2021-05-21 DIAGNOSIS — I10 Essential (primary) hypertension: Secondary | ICD-10-CM | POA: Diagnosis not present

## 2021-05-21 DIAGNOSIS — Z7982 Long term (current) use of aspirin: Secondary | ICD-10-CM | POA: Diagnosis not present

## 2021-05-21 LAB — HEMOGLOBIN AND HEMATOCRIT, BLOOD
HCT: 40.8 % (ref 39.0–52.0)
HCT: 43.5 % (ref 39.0–52.0)
Hemoglobin: 13.5 g/dL (ref 13.0–17.0)
Hemoglobin: 14.7 g/dL (ref 13.0–17.0)

## 2021-05-21 MED ORDER — TRAMADOL HCL 50 MG PO TABS
50.0000 mg | ORAL_TABLET | Freq: Four times a day (QID) | ORAL | Status: DC | PRN
  Administered 2021-05-21: 50 mg via ORAL
  Filled 2021-05-21: qty 1

## 2021-05-21 MED ORDER — BISACODYL 10 MG RE SUPP
10.0000 mg | Freq: Once | RECTAL | Status: DC
  Filled 2021-05-21: qty 1

## 2021-05-21 NOTE — TOC Progression Note (Signed)
Transition of Care [facility]) - Progression Note    Patient Details  Name: Jesus Judice, Jesus Ramos MRN: [id number] Date of Birth: 02/12/1956  Transition of Care [facility]) CM/SW Contact  Purcell Mouton, RN Phone Number: 05/21/2021, 1:19 PM  Clinical Narrative:      Transition of Care [facility]) Screening Note   Patient Details  Name: Jesus Richards, Jesus Ramos Date of Birth: [birth year]   Transition of Care [facility]) CM/SW Contact:    Purcell Mouton, RN Phone Number: 05/21/2021, 1:19 PM    Transition of Care Department [facility]) has reviewed patient and no TOC needs have been identified at this time. We will continue to monitor patient advancement through interdisciplinary progression rounds. If new patient transition needs arise, please place a TOC consult.         Expected Discharge Plan and Services           Expected Discharge Date: 05/21/21                                     Social Determinants of Health (SDOH) Interventions    Readmission Risk Interventions No flowsheet data found.

## 2021-05-21 NOTE — Progress Notes (Signed)
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   MRN: [id number]  1 Day Post-Op Subjective: The patient is doing well.  No nausea or vomiting. Pain is adequately controlled.  Objective: Vital signs in last 24 hours: Temp:  [98 F (36.7 C)-98.6 F (37 C)] 98 F (36.7 C) (02/03 0416) Pulse Rate:  [65-83] 65 (02/03 0416) Resp:  [10-18] 10 (02/02 1945) BP: (114-172)/(64-96) 134/81 (02/03 0416) SpO2:  [89 %-99 %] 95 % (02/03 0416) Weight:  [145.2 kg-145.6 kg] 145.6 kg (02/02 2017)  Intake/Output from previous day: 02/02 0701 - 02/03 0700 In: 2057.5 [P.O.:720; I.V.:1237.5; IV Piggyback:100] Out: 1910 [Urine:1600; Drains:160; Blood:150] Intake/Output this shift: No intake/output data recorded.  Physical Exam:  General: Alert and oriented. CV: RRR Lungs: Clear bilaterally. GI: Soft, Nondistended. Incisions: Clean, dry, and intact Urine: Clear Extremities: Nontender, no erythema, no edema.  Lab Results: Recent Labs    05/20/21 1536 05/21/21 0343  HGB 15.3 13.5  HCT 45.8 40.8      Assessment/Plan: POD# 1 s/p robotic prostatectomy.  1) SL IVF 2) Ambulate, Incentive spirometry 3) Transition to oral pain medication 4) Dulcolax suppository 5) D/C pelvic drain 6) Plan for likely discharge later today   [name]. MD   LOS: 0 days   [name] 05/21/2021, 8:01 AM

## 2021-05-21 NOTE — Discharge Summary (Signed)
Date of admission: 05/20/2021  Date of discharge: 05/21/2021  Admission diagnosis: Prostate Cancer  Discharge diagnosis: Prostate Cancer  History and Physical: For full details, please see admission history and physical. 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  After discussing management/treatment options, he elected to proceed with surgical treatment.  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 He tolerated this procedure well and without complications. Postoperatively, he was able to be transferred to a regular hospital room following recovery from anesthesia.  He was able to begin ambulating the night of surgery. He remained hemodynamically stable overnight.  He had excellent urine output with appropriately minimal output from his pelvic drain and his pelvic drain was removed on POD #1.  He was transitioned to oral pain medication, tolerated a clear liquid diet, and had met all discharge criteria and was able to be discharged home later on POD#1.  Laboratory values:  Recent Labs    05/20/21 1536 05/21/21 0343 05/21/21 1044  HGB 15.3 13.5 14.7  HCT 45.8 40.8 43.5    Disposition: Home  Discharge instruction: He was instructed to be ambulatory but to refrain from heavy lifting, strenuous activity, or driving. He was instructed on urethral catheter care.  Discharge medications:   Allergies as of 05/21/2021   No Known Allergies      Medication List     STOP taking these medications    aspirin 81 MG tablet   diazepam 10 MG tablet Commonly known as: VALIUM   levofloxacin 750 MG tablet Commonly known as: LEVAQUIN       TAKE these medications    amLODipine 10 MG tablet Commonly known as: NORVASC Take 1 tablet (10 mg total) by mouth daily. What changed: Another medication with the same name was removed. Continue  taking this medication, and follow the directions you see here.   docusate sodium 100 MG capsule Commonly known as: COLACE Take 1 capsule (100 mg total) by mouth 2 (two) times daily.   escitalopram 20 MG tablet Commonly known as: LEXAPRO TAKE 1 TABLET BY MOUTH ONCE DAILY What changed: Another medication with the same name was removed. Continue taking this medication, and follow the directions you see here.   rosuvastatin 20 MG tablet Commonly known as: CRESTOR Take 1 tablet (20 mg total) by mouth daily. What changed: Another medication with the same name was removed. Continue taking this medication, and follow the directions you see here.   Skyrizi 150 MG/ML Sosy Generic drug: Risankizumab-rzaa Inject 150 mg (1 syringe) into the skin every 12 weeks.   sulfamethoxazole-trimethoprim 800-160 MG tablet Commonly known as: BACTRIM DS Take 1 tablet by mouth 2 (two) times daily. Start the day prior to foley removal appointment   traMADol 50 MG tablet Commonly known as: Ultram Take 1-2 tablets (50-100 mg total) by mouth every 6 (six) hours as needed for moderate pain or severe pain.   valsartan 160 MG tablet Commonly known as: DIOVAN Take 1 tablet (160 mg total) by mouth daily. What changed: Another medication with the same name was removed. Continue taking this medication, and follow the directions you see here.        Followup: He will followup in 1 week for catheter removal and to discuss his surgical pathology results.

## 2021-05-21 NOTE — Plan of Care (Signed)
  Problem: Education: Goal: Knowledge of General Education information will improve Description: Including pain rating scale, medication(s)/side effects and non-pharmacologic comfort measures Outcome: Progressing   Problem: Clinical Measurements: Goal: Will remain free from infection Outcome: Progressing   Problem: Activity: Goal: Risk for activity intolerance will decrease Outcome: Progressing   

## 2021-05-26 LAB — SURGICAL PATHOLOGY

## 2021-06-08 ENCOUNTER — Other Ambulatory Visit (HOSPITAL_COMMUNITY): Payer: Self-pay

## 2021-06-16 DIAGNOSIS — Z111 Encounter for screening for respiratory tuberculosis: Secondary | ICD-10-CM | POA: Diagnosis not present

## 2021-06-24 ENCOUNTER — Other Ambulatory Visit (HOSPITAL_COMMUNITY): Payer: Self-pay

## 2021-06-28 ENCOUNTER — Telehealth: Payer: Self-pay | Admitting: *Deleted

## 2021-06-28 ENCOUNTER — Other Ambulatory Visit (HOSPITAL_COMMUNITY): Payer: Self-pay

## 2021-06-29 ENCOUNTER — Other Ambulatory Visit (HOSPITAL_COMMUNITY): Payer: Self-pay

## 2021-07-12 NOTE — Progress Notes (Signed)
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  ? ?Pt had radical prostatectomy on 05/26/21 by Dr. Alinda Money.  ? ?Patient has had 1 week post-op follow up @ [facility], and is scheduled for post-op PSA lab check in April.  ? ? ?

## 2021-07-22 ENCOUNTER — Other Ambulatory Visit (HOSPITAL_COMMUNITY): Payer: Self-pay

## 2021-07-23 DIAGNOSIS — G4733 Obstructive sleep apnea (adult) (pediatric): Secondary | ICD-10-CM | POA: Diagnosis not present

## 2021-08-06 ENCOUNTER — Other Ambulatory Visit (HOSPITAL_COMMUNITY)
Admission: RE | Admit: 2021-08-06 | Discharge: 2021-08-06 | Disposition: A | Discharge status: Home / Self Care | Payer: 59 | Source: Other Acute Inpatient Hospital | Attending: Urology | Admitting: Urology

## 2021-08-06 DIAGNOSIS — C61 Malignant neoplasm of prostate: Secondary | ICD-10-CM | POA: Insufficient documentation

## 2021-08-06 LAB — URINALYSIS, ROUTINE W REFLEX MICROSCOPIC
Bilirubin Urine: NEGATIVE
Glucose, UA: NEGATIVE mg/dL
Hgb urine dipstick: NEGATIVE
Ketones, ur: NEGATIVE mg/dL
Leukocytes,Ua: NEGATIVE
Nitrite: NEGATIVE
Protein, ur: NEGATIVE mg/dL
Specific Gravity, Urine: 1.027 (ref 1.005–1.030)
pH: 5 (ref 5.0–8.0)

## 2021-08-06 LAB — PSA: Prostatic Specific Antigen: 0.07 ng/mL (ref 0.00–4.00)

## 2021-09-15 DIAGNOSIS — Z961 Presence of intraocular lens: Secondary | ICD-10-CM | POA: Insufficient documentation

## 2021-09-15 DIAGNOSIS — H26492 Other secondary cataract, left eye: Secondary | ICD-10-CM | POA: Diagnosis not present

## 2021-09-17 ENCOUNTER — Other Ambulatory Visit (HOSPITAL_COMMUNITY): Payer: Self-pay

## 2021-09-21 ENCOUNTER — Other Ambulatory Visit (HOSPITAL_COMMUNITY): Payer: Self-pay

## 2021-11-01 ENCOUNTER — Other Ambulatory Visit (HOSPITAL_COMMUNITY): Payer: Self-pay

## 2021-11-01 ENCOUNTER — Other Ambulatory Visit (HOSPITAL_COMMUNITY)
Admission: RE | Admit: 2021-11-01 | Discharge: 2021-11-01 | Disposition: A | Discharge status: Home / Self Care | Payer: 59 | Source: Ambulatory Visit | Attending: Urology | Admitting: Urology

## 2021-11-01 DIAGNOSIS — C61 Malignant neoplasm of prostate: Secondary | ICD-10-CM | POA: Insufficient documentation

## 2021-11-01 LAB — URINALYSIS, ROUTINE W REFLEX MICROSCOPIC
Bacteria, UA: NONE SEEN
Bilirubin Urine: NEGATIVE
Glucose, UA: NEGATIVE mg/dL
Ketones, ur: NEGATIVE mg/dL
Leukocytes,Ua: NEGATIVE
Nitrite: NEGATIVE
Protein, ur: NEGATIVE mg/dL
Specific Gravity, Urine: 1.025 (ref 1.005–1.030)
pH: 5 (ref 5.0–8.0)

## 2021-11-01 LAB — PSA: Prostatic Specific Antigen: 0.2 ng/mL (ref 0.00–4.00)

## 2021-11-03 ENCOUNTER — Other Ambulatory Visit (HOSPITAL_COMMUNITY): Payer: Self-pay

## 2021-11-04 ENCOUNTER — Other Ambulatory Visit (HOSPITAL_COMMUNITY): Payer: Self-pay

## 2021-11-04 MED ORDER — AMLODIPINE BESYLATE 10 MG PO TABS
10.0000 mg | ORAL_TABLET | Freq: Every day | ORAL | 4 refills | Status: DC
  Filled 2021-11-04 – 2022-02-04 (×2): qty 90, 90d supply, fill #0
  Filled 2022-05-08: qty 90, 90d supply, fill #1
  Filled 2022-08-02 (×2): qty 90, 90d supply, fill #2

## 2021-11-04 MED ORDER — ESCITALOPRAM OXALATE 20 MG PO TABS
20.0000 mg | ORAL_TABLET | Freq: Every day | ORAL | 4 refills | Status: DC
  Filled 2021-11-04: qty 90, 90d supply, fill #0
  Filled 2022-02-04: qty 90, 90d supply, fill #1
  Filled 2022-05-11: qty 90, 90d supply, fill #2
  Filled 2022-08-02 (×2): qty 90, 90d supply, fill #3

## 2021-12-06 ENCOUNTER — Other Ambulatory Visit (HOSPITAL_COMMUNITY): Payer: Self-pay

## 2021-12-10 ENCOUNTER — Other Ambulatory Visit (HOSPITAL_COMMUNITY): Payer: Self-pay

## 2021-12-13 ENCOUNTER — Other Ambulatory Visit: Payer: Self-pay | Admitting: Pharmacist

## 2021-12-13 ENCOUNTER — Other Ambulatory Visit (HOSPITAL_COMMUNITY): Payer: Self-pay

## 2021-12-13 MED ORDER — SKYRIZI 150 MG/ML ~~LOC~~ SOSY
150.0000 mg | PREFILLED_SYRINGE | SUBCUTANEOUS | 3 refills | Status: DC
  Filled 2021-12-13: qty 1, 84d supply, fill #0

## 2021-12-13 MED ORDER — SKYRIZI 150 MG/ML ~~LOC~~ SOSY
150.0000 mg | PREFILLED_SYRINGE | SUBCUTANEOUS | 3 refills | Status: DC
  Filled 2021-12-13: qty 1, 84d supply, fill #0
  Filled 2022-03-03: qty 1, 84d supply, fill #1
  Filled 2022-05-25: qty 1, 84d supply, fill #2
  Filled 2022-09-02: qty 1, 84d supply, fill #3

## 2021-12-23 ENCOUNTER — Other Ambulatory Visit (HOSPITAL_COMMUNITY): Payer: Self-pay | Admitting: Urology

## 2021-12-23 DIAGNOSIS — C61 Malignant neoplasm of prostate: Secondary | ICD-10-CM

## 2021-12-31 ENCOUNTER — Ambulatory Visit (HOSPITAL_COMMUNITY)
Admission: RE | Admit: 2021-12-31 | Discharge: 2021-12-31 | Disposition: A | Discharge status: Home / Self Care | Payer: 59 | Source: Ambulatory Visit | Attending: Urology | Admitting: Urology

## 2021-12-31 DIAGNOSIS — C61 Malignant neoplasm of prostate: Secondary | ICD-10-CM | POA: Diagnosis not present

## 2021-12-31 MED ORDER — PIFLIFOLASTAT F 18 (PYLARIFY) INJECTION
9.0000 | Freq: Once | INTRAVENOUS | Status: AC
  Administered 2021-12-31: 9 via INTRAVENOUS

## 2022-01-03 DIAGNOSIS — Z7982 Long term (current) use of aspirin: Secondary | ICD-10-CM | POA: Diagnosis not present

## 2022-01-03 DIAGNOSIS — H2511 Age-related nuclear cataract, right eye: Secondary | ICD-10-CM | POA: Diagnosis not present

## 2022-01-04 NOTE — Progress Notes (Signed)
I called pt to introduce myself as the Prostate Nurse Navigator and the Coordinator of the Prostate [place].   1. I confirmed with the patient he is aware of his referral to the clinic 9/26, arriving @ 12:30 pm.    2. I discussed the format of the clinic and the physicians he will be seeing that day.   3. I discussed where the clinic is located and how to contact me.   4. I confirmed with patient that since he was seen at [facility] back in 02/2021 we would not have to provide mailed information, patient was comfortable with this.     He voiced understanding of the above. I asked him to call me if he has any questions or concerns regarding his appointments or the forms he needs to complete.

## 2022-01-06 ENCOUNTER — Other Ambulatory Visit (HOSPITAL_COMMUNITY)
Admission: RE | Admit: 2022-01-06 | Discharge: 2022-01-06 | Disposition: A | Discharge status: Home / Self Care | Payer: 59 | Source: Ambulatory Visit | Attending: Urology | Admitting: Urology

## 2022-01-06 DIAGNOSIS — L400 Psoriasis vulgaris: Secondary | ICD-10-CM | POA: Diagnosis not present

## 2022-01-06 DIAGNOSIS — C61 Malignant neoplasm of prostate: Secondary | ICD-10-CM | POA: Diagnosis not present

## 2022-01-06 LAB — PSA: Prostatic Specific Antigen: 0.6 ng/mL (ref 0.00–4.00)

## 2022-01-07 NOTE — Progress Notes (Signed)
                               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 Raynelle Bring, [facility]  Radiation Oncologist - Dr. Tyler Pita, [facility]   Medical Oncologist - Dr. Zola Button, [place]  Recommendations: 1)  2)  3)  * These recommendations are based on information available as of today's consult.      Recommendations may change depending on the results of further tests or exams.    Next Steps: 1)  2) 3)  When appointments need to be scheduled, you will be contacted by [facility] and/or [facility].  Questions?  Please do not hesitate to call Katheren Puller, BSN, RN at [phone] with any questions or concerns.  Kathlee Nations is your Oncology Nurse Navigator and is available to assist you while you're receiving your medical care at [facility].

## 2022-01-11 ENCOUNTER — Other Ambulatory Visit (HOSPITAL_COMMUNITY): Payer: Self-pay

## 2022-01-11 ENCOUNTER — Other Ambulatory Visit: Payer: Self-pay

## 2022-01-11 ENCOUNTER — Telehealth: Payer: Self-pay

## 2022-01-11 ENCOUNTER — Ambulatory Visit
Admission: RE | Admit: 2022-01-11 | Discharge: 2022-01-11 | Disposition: A | Discharge status: Home / Self Care | Payer: 59 | Source: Ambulatory Visit | Attending: Radiation Oncology | Admitting: Radiation Oncology

## 2022-01-11 ENCOUNTER — Telehealth: Payer: Self-pay | Admitting: Pharmacy Technician

## 2022-01-11 ENCOUNTER — Inpatient Hospital Stay: Payer: 59 | Attending: Oncology | Admitting: Oncology

## 2022-01-11 DIAGNOSIS — C7951 Secondary malignant neoplasm of bone: Secondary | ICD-10-CM | POA: Diagnosis not present

## 2022-01-11 DIAGNOSIS — C61 Malignant neoplasm of prostate: Secondary | ICD-10-CM

## 2022-01-11 DIAGNOSIS — C775 Secondary and unspecified malignant neoplasm of intrapelvic lymph nodes: Secondary | ICD-10-CM | POA: Diagnosis not present

## 2022-01-11 DIAGNOSIS — Z191 Hormone sensitive malignancy status: Secondary | ICD-10-CM | POA: Diagnosis not present

## 2022-01-11 DIAGNOSIS — Z9079 Acquired absence of other genital organ(s): Secondary | ICD-10-CM | POA: Insufficient documentation

## 2022-01-11 MED ORDER — PREDNISONE 5 MG PO TABS
5.0000 mg | ORAL_TABLET | Freq: Every day | ORAL | 1 refills | Status: DC
  Filled 2022-01-11: qty 90, 90d supply, fill #0
  Filled 2022-04-11: qty 90, 90d supply, fill #1

## 2022-01-11 MED ORDER — ABIRATERONE ACETATE 250 MG PO TABS
1000.0000 mg | ORAL_TABLET | Freq: Every day | ORAL | 0 refills | Status: DC
  Filled 2022-01-11 – 2022-01-17 (×2): qty 120, 30d supply, fill #0

## 2022-01-11 NOTE — Telephone Encounter (Signed)
Oral Oncology Patient Advocate Encounter   Received notification that prior authorization for Abiraterone is required.   PA submitted on 01/11/2022 Key [id number] Status is pending     Jesus Ramos, CPhT-Adv Oncology Pharmacy Patient [name] Direct Number: [phone]  Fax: [phone]

## 2022-01-11 NOTE — Progress Notes (Signed)
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 Malignant neoplasm of prostate metastatic to bone ([place])  C61    C79.51     2. 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  He was initially noted to have a prostate nodule on physical exam by his primary care physician, Dr. Willey Blade. While his PSA had been slowly rising, it had remained in the normal range at 2.6 in April 2022, but given his abnormal prostate exam, he was referred for evaluation in urology by Dr. Alinda Money on 01/19/21. Digital rectal examination was performed at that time revealing significant nodularity in the right hemi-prostate, extending towards the right apex, but without definite extra prostatic extension noted.  A repeat PSA obtained that day was stable at 2.29. He underwent prostate MRI on 02/21/21 showing a PI-RADS 5 lesion in the left posterolateral and posterior peripheral zone tracking towards the prostate apex and crossing midline at the inferior-most aspect of prostate with signs of extracapsular extension and neurovascular bundle involvement. There was no evidence of pelvic lymphadenopathy or osseous metastases.  The patient proceeded to MRI fusion biopsy of the prostate on 03/09/21. Out of 16 core biopsies, 8 were positive.  The maximum  Gleason score was 4+5, and this was seen in all four ROI MRI samples (with perineural invasion) and the left mid. Additionally, Gleason 4+4 was seen in the left apex (with PNI), Gleason 4+3 in the left apex lateral, and Gleason 3+4 in the left base lateral.    A PSMA PET scan was performed on 03/25/21 for disease staging and showed left apical prostatic tracer affinity, consistent with site of primary o biopsy, with likely neurovascular bundle involvement on the left but no tracer-avid metastatic disease.  We initially met Dr. Gala Romney in our multidisciplinary prostate clinic on 04/13/21 to discuss treatment options and he ultimately opted to proceed with prostatectomy. RALP with BPLND was performed on 05/20/21 under the care of Dr. Alinda Money and final surgical pathology confirmed pT3aN0, Gleason 4+5 prostatic adenocarcinoma with extraprostatic extension at the left posterolateral apex and mid gland and lymphovascular invasion. Fortunately, the seminal vesicles, vasa deferentia, all resection margins, and all 11 lymph sampled nodes were negative.  His initial postoperative PSA was low detectable at 0.07 and had increased to 0.2 when repeated 11/01/21. He underwent restaging PSMA PET scan on 12/31/21 and this showed signs of nodal (a left obturator, left peri-rectal and right internal iliac) metastasis and a solitary bony  metastasis in the right acromial but no signs of radiotracer accumulation in the prostatectomy bed or solid organ metastasis. His most recent PSA  performed 01/04/22 was further elevated to 0.6.  The patient reviewed the recent imaging and lab results with his urologist and he has kindly been referred today to the multidisciplinary prostate cancer clinic for presentation of  pathology and radiology studies in our conference for discussion of potential radiation treatment options and clinical evaluation.   PREVIOUS RADIATION THERAPY: No  PAST MEDICAL HISTORY:  has a past medical history of Cancer  ([place]), Complication of anesthesia, Elevated coronary artery calcium score, Essential hypertension, History of colonic polyps, History of diverticulitis, Hyperlipidemia, Impaired fasting glucose, OSA on CPAP, PONV (postoperative nausea and vomiting), and Pre-diabetes.    PAST SURGICAL HISTORY: Past Surgical History:  Procedure Laterality Date   COLONOSCOPY  05/23/2011   Procedure: COLONOSCOPY;  Surgeon: Owens Loffler, MD;  Location: [facility];  Service: Endoscopy;  Laterality: N/A;   EYE SURGERY     LYMPHADENECTOMY Bilateral 05/20/2021   Procedure: LYMPHADENECTOMY, PELVIC;  Surgeon: Raynelle Bring, MD;  Location: [facility];  Service: Urology;  Laterality: Bilateral;   NASAL SEPTUM SURGERY     ROBOT ASSISTED LAPAROSCOPIC RADICAL PROSTATECTOMY N/A 05/20/2021   Procedure: XI ROBOTIC ASSISTED LAPAROSCOPIC RADICAL PROSTATECTOMY LEVEL 2;  Surgeon: Raynelle Bring, MD;  Location: [facility];  Service: Urology;  Laterality: N/A;   TONSILLECTOMY      FAMILY HISTORY: family history includes Bladder Cancer in his father; Dementia in his mother; Heart disease in his father; Hypertension in his mother.  SOCIAL HISTORY:  reports that he has never smoked. He has never used smokeless tobacco. He reports that he does not drink alcohol and does not use drugs.  ALLERGIES: Patient has no known allergies.  MEDICATIONS:  Current Outpatient Medications  Medication Sig Dispense Refill   abiraterone acetate (ZYTIGA) 250 MG tablet Take 4 tablets (1,000 mg total) by mouth daily. Take on an empty stomach 1 hour before or 2 hours after a meal 120 tablet 0   amLODipine (NORVASC) 10 MG tablet Take 1 tablet (10 mg total) by mouth daily. 90 tablet 4   amLODipine (NORVASC) 10 MG tablet Take 1 tablet (10 mg total) by mouth daily. 90 tablet 4   docusate sodium (COLACE) 100 MG capsule Take 1 capsule (100 mg total) by mouth 2 (two) times daily.     escitalopram (LEXAPRO) 20 MG tablet Take 1 tablet (20 mg total) by mouth daily. 90 tablet  4   predniSONE (DELTASONE) 5 MG tablet Take 1 tablet (5 mg total) by mouth daily with breakfast. 90 tablet 1   Risankizumab-rzaa (SKYRIZI) 150 MG/ML SOSY Inject 150 mg (1 syringe) into the skin every 12 weeks. 1 mL 3   rosuvastatin (CRESTOR) 20 MG tablet Take 1 tablet (20 mg total) by mouth daily. 90 tablet 3   sulfamethoxazole-trimethoprim (BACTRIM DS) 800-160 MG tablet Take 1 tablet by mouth 2 (two) times daily. Start the day prior to foley removal appointment (Patient not taking: Reported on 01/11/2022) 6 tablet 0   traMADol (ULTRAM) 50 MG tablet Take 1-2 tablets (50-100 mg total) by mouth every 6 (six) hours as needed for moderate pain or severe pain. (Patient not taking: Reported on 01/11/2022) 20 tablet 0   valsartan (DIOVAN) 160 MG tablet Take 1 tablet (160 mg total) by mouth daily. 90 tablet 4   No current facility-administered medications for this encounter.    REVIEW OF SYSTEMS:  On review of systems, the patient reports that he is doing well overall. He denies any chest pain, shortness of breath, cough, fevers, chills, night sweats, unintended weight changes. He denies any bowel disturbances, and denies abdominal pain, nausea or vomiting. He denies any new musculoskeletal or joint aches or pains. His IPSS was 1, indicating minimal urinary symptoms. He has regained full continence.  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 A complete review of systems is obtained and is otherwise negative.   PHYSICAL EXAM:  Wt Readings from Last 3 Encounters:  01/11/22 (!) 324 lb (147 kg)  05/20/21 (!) 321 lb (145.6 kg)  05/12/21 (!) 320 lb (145.2 kg)   Temp Readings from Last 3 Encounters:  01/11/22 98.2 F (36.8 C) (Temporal)  05/21/21 97.7 F (36.5 C) (Oral)  05/12/21 98 F (36.7 C) (Oral)   BP Readings from Last 3 Encounters:  01/11/22 (!) 149/83  05/21/21 138/77  04/13/21 140/88   Pulse Readings from Last 3 Encounters:  01/11/22 76  05/21/21 64   05/12/21 62    /10  In general this is a well appearing Caucasian male in no acute distress. He's alert and oriented x4 and appropriate throughout the examination. Cardiopulmonary assessment is negative for acute distress and he exhibits normal effort.    KPS = 100  100 - Normal; no complaints; no evidence of disease. 90   - Able to carry on normal activity; minor signs or symptoms of disease. 80   - Normal activity with effort; some signs or symptoms of disease. 57   - Cares for self; unable to carry on normal activity or to do active work. 60   - Requires occasional assistance, but is able to care for most of his personal needs. 50   - Requires considerable assistance and frequent medical care. 62   - Disabled; requires special care and assistance. 62   - Severely disabled; hospital admission is indicated although death not imminent. 31   - Very sick; hospital admission necessary; active supportive treatment necessary. 10   - Moribund; fatal processes progressing rapidly. 0     - Dead  Karnofsky DA, Abelmann [place], Craver LS and Burchenal [facility] [phone]) The use of the nitrogen mustards in the palliative treatment of carcinoma: with particular reference to bronchogenic carcinoma Cancer 1 [phone]   LABORATORY DATA:  Lab Results  Component Value Date   WBC 8.8 05/12/2021   HGB 14.7 05/21/2021   HCT 43.5 05/21/2021   MCV 88.9 05/12/2021   PLT 295 05/12/2021   Lab Results  Component Value Date   NA 136 05/12/2021   K 4.0 05/12/2021   CL 106 05/12/2021   CO2 22 05/12/2021   Lab Results  Component Value Date   ALT 21 11/16/2018   AST 19 11/16/2018   ALKPHOS 60 11/16/2018   BILITOT 0.8 11/16/2018     RADIOGRAPHY: NM PET (PSMA) SKULL TO MID THIGH  Result Date: 01/03/2022 CLINICAL DATA:  Prostate carcinoma with biochemical recurrence. History of prostatectomy in February of 2023. Signs of PSA recurrence. EXAM: NUCLEAR MEDICINE PET SKULL BASE TO THIGH TECHNIQUE: 9.7 mCi F18  Piflufolastat (Pylarify) was injected intravenously. Full-ring PET imaging was performed from the skull base to thigh after the radiotracer. CT data was obtained and used for attenuation correction and anatomic localization. COMPARISON:  March 25, 2021. FINDINGS: NECK No radiotracer accumulation in lymph nodes in the neck. Incidental CT finding: None. CHEST No radiotracer accumulation within mediastinal or hilar lymph nodes. No suspicious pulmonary nodules on the CT scan. Incidental CT finding: Calcified coronary artery disease and LEFT greater than RIGHT coronary circulation. Normal heart size without pericardial effusion. Normal caliber of central pulmonary vasculature and thoracic aorta. No adenopathy by size criteria. Lungs are clear without signs of consolidation or effusion. ABDOMEN/PELVIS Prostate: Post prostatectomy. No signs of abnormal radiotracer accumulation in  the prostate bed. Lymph nodes: Tiny lymph node at the upper margin of the prostatectomy bed (image 217/4) 4 mm with minimal radiotracer accumulation with a maximum SUV of 2.83. There is however a LEFT obturator lymph node measuring 8 mm showing a maximum SUV of 7.01 (image 210/3) RIGHT internal iliac lymph node (image 202/4) 11 mm showing a maximum SUV of 16.09. Liver: No evidence of liver metastasis. Incidental CT finding: Smooth hepatic contours without pericholecystic stranding. No acute findings relative to pancreas, spleen, adrenal glands and kidneys. No acute findings relative to the gastrointestinal tract. Normal appendix. Sigmoid diverticular changes. Aortic atherosclerosis without aneurysm. No adenopathy by size criteria elsewhere in the abdomen or in the pelvis. SKELETON RIGHT acromial uptake (image 64/4) no visible lesion in this location but maximum SUV of 21.67 compatible with single site of bony metastasis. IMPRESSION: Signs of nodal and bony metastatic disease in this patient with history of high-risk prostate cancer. No signs of  radiotracer accumulation in the prostatectomy bed or solid organ metastasis at this time. Aortic atherosclerosis and coronary artery disease. Electronically Signed   By: Zetta Bills M.D.   On: 01/03/2022 13:47      IMPRESSION/PLAN: 66 y.o. 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  We discussed the natural history of prostate cancer.  We reviewed the the implications of positive margins, extracapsular extension, and seminal vesicle involvement on the risk of prostate cancer recurrence. In his case, extraprostatic extension and lymphovascular invasion were present and we now have evidence of metastatic disease involving the pelvic lymph nodes and a solitary bony metastasis in the right acromion with a rising, post-operative PSA. We discussed the role of radiotherapy in the management of oligometastatic disease. We discussed the available radiation techniques, and focused on the details and logistics of delivery. The recommendation is for a 7.5 week course of daily external beam radiation to the prostatic fossa and pelvic nodes, while simultaneously treating the solitary bony lesion in the right acromion with a 5 fraction course of stereotactic body radiotherapy (SBRT). We reviewed the anticipated acute and late sequelae associated with radiation in this setting. The patient was encouraged to ask questions that were answered to his stated satisfaction.   At the end of our conversation, the patient is in agreement to proceed with the recommended treatment course consisting of LT-ADT plus Androgen Receptor Blockade for therapy escalation, under the care of Dr. Alen Blew, concurrent with a 7.5 week course of daily IMRT to the pelvic nodes and prostate fossa with a boost to the PET positive  nodes and SBRT to the solitary osseous metastasis in the right acromion. He has freely signed written consent to proceed today in the office and a copy of this document will be placed in his medical record. He is scheduled for CT SIM at 10am on Tuesday 01/18/22 so we will share our discussion with Dr. Alinda Money and Dr. Alen Blew and proceed with treatment planning accordingly, in anticipation of beginning his daily treatments in the near future. We enjoyed meeting with him again today and look forward to continuing to participate in his care.  We personally spent 60 minutes in this encounter including chart review, reviewing radiological studies, meeting face-to-face with the patient, entering orders and completing documentation.    Nicholos Johns, PA-C  Tyler Pita, MD  [facility]  Radiation Oncology Direct Dial: [phone]  Fax: [phone] [email or web address]  Skype  [email or web address]   This document serves as a record of services personally performed by Tyler Pita, MD and Freeman Caldron, PA-C. It was created on their behalf by Wilburn Mylar, a trained medical scribe. The creation of this record is based on the scribe's personal observations and the provider's statements to them. This document has been checked and approved by the attending provider.

## 2022-01-11 NOTE — Progress Notes (Signed)
Hematology and Oncology Follow Up Visit  Jesus Gamblin, MD [id number] 1955-07-05 66 y.o. 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  He initially presented with Gleason score 9 PSA of 3.6 in November 2022.   Prior Therapy: Robotic assisted laparoscopic radical prostatectomy completed on May 20, 2021 under the care of Dr. Alinda Ramos.  The final pathology showed T3AN0, Gleason score 9 with extracapsular extension.  0 out of 11 lymph nodes were involved with cancer.  His PSA postoperatively was 0.7 and subsequently was up to 0.6 in September 2023 with PET scan showing advanced disease.  Current therapy: Under evaluation for systemic therapy.  Interim History: Jesus Ramos returns today for a follow-up.  Since his last evaluation underwent radical prostatectomy without any complications.  He is resumed activities of daily living without any decline.  He has resumed work related duties as well.  He denies any hospitalizations or illnesses.  He denies any incontinence.  His PSA continues to rise postoperatively up to 0.6 in September 2023.  PSMA PET scan obtained on January 03, 2022 showed to increase to nodal recurrence within the pelvis as well as an isolated right acromial uptake compatible with single bony metastatic disease.    Medications: I have reviewed the patient's current medications.  Current Outpatient Medications  Medication Sig Dispense Refill   amLODipine (NORVASC) 10 MG tablet Take 1 tablet (10 mg total) by mouth daily. 90 tablet 4   amLODipine (NORVASC) 10 MG tablet Take 1 tablet (10 mg total) by mouth daily. 90 tablet 4   docusate sodium (COLACE) 100 MG capsule Take 1 capsule (100 mg total) by mouth 2 (two) times daily.     escitalopram (LEXAPRO) 20 MG tablet Take 1 tablet (20 mg total) by  mouth daily. 90 tablet 4   Risankizumab-rzaa (SKYRIZI) 150 MG/ML SOSY Inject 150 mg (1 syringe) into the skin every 12 weeks. 1 mL 3   rosuvastatin (CRESTOR) 20 MG tablet Take 1 tablet (20 mg total) by mouth daily. 90 tablet 3   valsartan (DIOVAN) 160 MG tablet Take 1 tablet (160 mg total) by mouth daily. 90 tablet 4   sulfamethoxazole-trimethoprim (BACTRIM DS) 800-160 MG tablet Take 1 tablet by mouth 2 (two) times daily. Start the day prior to foley removal appointment (Patient not taking: Reported on 01/11/2022) 6 tablet 0   traMADol (ULTRAM) 50 MG tablet Take 1-2 tablets (50-100 mg total) by mouth every 6 (six) hours as needed for moderate pain or severe pain. (Patient not taking: Reported on 01/11/2022) 20 tablet 0   No current facility-administered medications for this visit.     Allergies: No Known Allergies   Physical Exam:   ECOG: 1       Lab Results: Lab Results  Component Value Date   WBC 8.8 05/12/2021   HGB 14.7 05/21/2021   HCT 43.5 05/21/2021   MCV 88.9 05/12/2021   PLT 295 05/12/2021     Chemistry      Component Value Date/Time   NA 136 05/12/2021 1517   K 4.0 05/12/2021 1517   CL 106 05/12/2021 1517   CO2 22 05/12/2021 1517   BUN 17 05/12/2021 1517   CREATININE 0.87 05/12/2021 1517      Component Value Date/Time   CALCIUM 9.5 05/12/2021 1517   ALKPHOS 60 11/16/2018 0737   AST 19 11/16/2018 0737  ALT 21 11/16/2018 0737   BILITOT 0.8 11/16/2018 0737          Impression and Plan:   66 year old with   Castration-sensitive advanced prostate cancer documented in September 2023 was a PSMA PET scan that showed nodal pelvic disease as well as isolated area of acromial metastasis.  He was initially diagnosed in November 2022.  He was found to have a Gleason score of 9 with PSA of 3.6.  Primary surgical therapy showed T3a disease with extraprostatic extension.   His case was discussed today in the prostate cancer multidisciplinary clinic, treatment  options were reviewed.  He does have systemic disease with PSA that is remains elevated and rising rapidly.  Androgen deprivation therapy remains the backbone to treating his disease.  Complications that include hot flashes, weight gain among others were reiterated.  Therapy escalation with abiraterone and prednisone were discussed.  Complications that include nausea, fatigue, hypertension, adrenal insufficiency as well as hyperglycemia associated with 5 mg of prednisone were discussed.  After discussion today he is agreeable to proceed with combination allergen deprivation therapy and abiraterone.  2.  Local therapy with radiation: He discussed this with Dr. Tammi Ramos and ready to proceed with radiation in the near future.  3.  Androgen deprivation therapy: We will arrange for Eligard to start next week.  He will receive 30 mg every 4 months.  4.  Follow-up: We will be in the near future to start Eligard and in 2 months for repeat evaluation.     40  minutes were spent on this encounter.  The time was dedicated to updating his disease status, reviewing imaging studies, laboratory data testing and outlining future plan of care discussion.   Jesus Button, MD 9/26/20231:48 PM

## 2022-01-11 NOTE — Consult Note (Signed)
Jesus Ramos     01/11/2022   --------------------------------------------------------------------------------   Jesus Ramos  MRN: [id number]  DOB: 1955-10-16, 66 year old Male  SSN: [id number]   PRIMARY CARE:    REFERRING:  Jesus Ramos. Jesus Pollock, MD  PROVIDER:  Raynelle Ramos, M.D.  LOCATION:  [facility] [phone]     --------------------------------------------------------------------------------   CC/HPI: CC: Prostate Cancer   PCP: Dr. 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 Willey Ramos during a routine physical exam with a right sided prostate nodule noted. His PSA was normal but had increased from a baseline leave of 1.1 in 2018 to 1.6 in 2020, 1.7 in 2021, and 2.6 when checked in 2022. I evaluated him in October 2022 and his exam was concerning enough to recommend a biopsy but we did obtain an MRI of the prostate first. This was done on 02/21/21 and indicated a PI-RADS 5 lesion with concern with possible EPE bilaterally (worse left than right) but no SVI, LAD, or bone lesions. An MR/US fusion biopsy was performed on 03/09/21 and confirmed Gleason 4+5=9 adenocarcinoma of the prostate with 4 out of 4 targeted biopsies positive and 4 out of 12 systematic cores positive for a total of 8 out of 16 cores positive. Baseline PSMA PET scan was negative for metastatic disease.   He was seen in the multidisciplinary Ramos initially back in December of 2022. He elected primary surgical therapy. His surgical pathology indicated pT3a N0 Mx, Gleason 4+5=9 adenocarcinoma with negative surgical margins. His first postoperative PSA in April 2023 was 0.07. His PSA then increased to 0.2 in July. He underwent a repeat PSMA PET scan on 12/31/21 that  indicated uptake at the right acrimonium, a small left obturator lymph node and perirectal lymph node, and an 11 mm right internal iliac lymph node.   His PSA last week was 0.6.     ALLERGIES: No Allergies    MEDICATIONS: Aspirin Ec 81 mg tablet, delayed release tablet  Crestor 20 mg tablet  Amlodipine Besylate 10 mg tablet  Lexapro  Skyrizi  Valsartan 160 mg tablet     GU PSH: Laparoscopy; Lymphadenectomy - 05/20/2021 Prostate Needle Biopsy - 03/09/2021 Robotic Radical Prostatectomy - 05/20/2021       PSH Notes: turbinate reduction with deviated septum approx 8 years   Cataract surgery left eye only    NON-GU PSH: Colonoscopy Eye Surgery (Unspecified) Surgical Pathology, Gross And Microscopic Examination For Prostate Needle - 03/09/2021 Tonsillectomy     GU PMH: ED due to arterial insufficiency - 08/11/2021 Prostate Cancer - 08/11/2021, - 05/26/2021, - 05/05/2021, - 05/04/2021, - 04/13/2021 Stress Incontinence - 08/11/2021 Prostate nodule w/o LUTS - 03/09/2021, - 01/19/2021      PMH Notes:   1) Prostate cancer: He is s/p a NNS RAL radical prostatectomy and BPLND on 05/20/21.   Diagnosis: pT3a N0 Mx, Gleason 4+5=9 adenocarcinoma with negative surgical margins  Pretreatment PSA: 2.6  Pretreatment SHIM score: 20   NON-GU PMH: Other lack of coordination - 05/05/2021 Hypercholesterolemia Hypertension Psoriasis, unspecified Sleep Apnea    FAMILY HISTORY: 1 Daughter - Runs in Family 1 son - Runs in Family Hypertension - Runs in Family    Notes: dad with low grade bladder cancer at age 13  SOCIAL HISTORY: Marital Status: Married Preferred Language: English; Ethnicity: Not Hispanic Or Latino; Race: White Current Smoking Status: Patient has never smoked.   Tobacco Use Assessment Completed: Used Tobacco in last 30 days? Has never drank.  Does not use drugs. Drinks 2 caffeinated drinks per day. Has not had a blood transfusion.    REVIEW OF SYSTEMS:    GU Review Male:    Patient denies frequent urination, hard to postpone urination, burning/ pain with urination, get up at night to urinate, leakage of urine, stream starts and stops, trouble starting your streams, and have to strain to urinate .  Gastrointestinal (Lower):   Patient denies diarrhea and constipation.  Gastrointestinal (Upper):   Patient denies nausea and vomiting.  Constitutional:   Patient denies fever, night sweats, weight loss, and fatigue.  Skin:   Patient denies skin rash/ lesion and itching.  Eyes:   Patient denies blurred vision and double vision.  Ears/ Nose/ Throat:   Patient denies sore throat and sinus problems.  Hematologic/Lymphatic:   Patient denies swollen glands and easy bruising.  Cardiovascular:   Patient denies leg swelling and chest pains.  Respiratory:   Patient denies cough and shortness of breath.  Endocrine:   Patient denies excessive thirst.  Musculoskeletal:   Patient denies back pain and joint pain.  Neurological:   Patient denies headaches and dizziness.  Psychologic:   Patient denies depression and anxiety.   VITAL SIGNS: None   MULTI-SYSTEM PHYSICAL EXAMINATION:    Constitutional: Well-nourished. No physical deformities. Normally developed. Good grooming.     Complexity of Data:  Lab Test Review:   PSA  Records Review:   Previous Patient Records  X-Ray Review: PET- PSMA Scan: Reviewed Films.     11/01/21 08/06/21 01/19/21  PSA  Total PSA 0.20 ng/ml 0.07 ng/ml 2.29 ng/mL   Notes:                     CLINICAL DATA: Prostate carcinoma with biochemical recurrence.  History of prostatectomy in February of 2023. Signs of PSA  recurrence.   EXAM:  NUCLEAR MEDICINE PET SKULL BASE TO THIGH   TECHNIQUE:  9.7 mCi F18 Piflufolastat (Pylarify) was injected intravenously.  Full-ring PET imaging was performed from the skull base to thigh  after the radiotracer. CT data was obtained and used for attenuation  correction and anatomic localization.   COMPARISON:  March 25, 2021.   FINDINGS:  NECK   No radiotracer accumulation in lymph nodes in the neck.   Incidental CT finding: None.   CHEST   No radiotracer accumulation within mediastinal or hilar lymph nodes.  No suspicious pulmonary nodules on the CT scan.   Incidental CT finding: Calcified coronary artery disease and LEFT  greater than RIGHT coronary circulation. Normal heart size without  pericardial effusion. Normal caliber of central pulmonary  vasculature and thoracic aorta. No adenopathy by size criteria.  Lungs are clear without signs of consolidation or effusion.   ABDOMEN/PELVIS   Prostate: Post prostatectomy. No signs of abnormal radiotracer  accumulation in the prostate bed.   Lymph nodes: Tiny lymph node at the upper margin of the  prostatectomy bed (image 217/4) 4 mm with minimal radiotracer  accumulation with a maximum SUV of 2.83. There is however a LEFT  obturator lymph node measuring 8 mm showing a maximum SUV of 7.01  (image 210/3)   RIGHT internal iliac lymph node (image 202/4) 11 mm showing a  maximum SUV of 16.09.  Liver: No evidence of liver metastasis.   Incidental CT finding: Smooth hepatic contours without  pericholecystic stranding.   No acute findings relative to pancreas, spleen, adrenal glands and  kidneys. No acute findings relative to the gastrointestinal tract.  Normal appendix. Sigmoid diverticular changes. Aortic  atherosclerosis without aneurysm. No adenopathy by size criteria  elsewhere in the abdomen or in the pelvis.   SKELETON   RIGHT acromial uptake (image 64/4) no visible lesion in this  location but maximum SUV of 21.67 compatible with single site of  bony metastasis.   IMPRESSION:  Signs of nodal and bony metastatic disease in this patient with  history of high-risk prostate cancer.   No signs of radiotracer accumulation in the prostatectomy bed or  solid organ metastasis at this time.   Aortic atherosclerosis and  coronary artery disease.    Electronically Signed  By: Zetta Bills M.D.  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 Oligometastatic prostate cancer: I had a detailed discussion with Jesus Ramos reviewing his PSMA PET scan results. He has met with both Dr. Alen Blew and Dr. Tammi Klippel and is agreeable to proceed with androgen deprivation therapy and abiraterone/prednisone under the care of Dr. Alen Blew along with pelvic radiation therapy to his pelvic lymph nodes and prostatic fossa as well as metastasis directed therapy to his right acrimonium. He will remain on systemic therapy for at least 2 years. He is otherwise doing well from a functional standpoint. At this time, he would like to cancel his follow-up with me and continue treatment at the cancer center under Dr. Alen Blew and Dr. Tammi Klippel. He will notify me if he would like to be seen in the future or to address any potential side effects from urologic standpoint.   CC: Dr. Asencion Ramos  Dr. Tyler Pita  Dr. Zola Button        Next Appointment:      Next Appointment: 01/21/2022 03:15 PM    Appointment Type: Office Visit Established Patient    Location: [facility] [phone]    Provider: Raynelle Ramos, M.D.    Reason for Visit: Next avail ov after PET scan to review results Okd by Dr. Letha Cape & M CODES: We spent 25 minutes dedicated to evaluation and management time, including face to face interaction, discussions on coordination of care, documentation, result review, and discussion with others as applicable.

## 2022-01-11 NOTE — Telephone Encounter (Signed)
Oral Oncology Pharmacist Encounter  Received new prescription for abiraterone (Zytiga) for the treatment of prostate cancer in conjunction with prednisone, planned duration until disease progression or unacceptable toxicity .  Labs from 05/12/21 (CBC and BMP) assessed, no interventions needed. Spoke to MD, okay to proceed with LFTs from 07/29/2020 which are WNL. Prescription dose and frequency assessed.   Current medication list in Epic reviewed, DDIs with Zytiga identified: - rosuvastatin: can cause increase in rhabdomyolysis effects when given with zytiga. Will have patient monitor.  Evaluated chart and no patient barriers to medication adherence noted.   Patient agreement for treatment documented in MD note on 01/11/2022.  Prescription has been e-scribed to the [facility] for benefits analysis and approval.  Oral Oncology Clinic will continue to follow for insurance authorization, copayment issues, initial counseling and start date.  Drema Halon, PharmD Hematology/Oncology Clinical Pharmacist [facility] [phone] 01/11/2022 2:39 PM

## 2022-01-12 ENCOUNTER — Other Ambulatory Visit (HOSPITAL_COMMUNITY): Payer: Self-pay

## 2022-01-12 DIAGNOSIS — C61 Malignant neoplasm of prostate: Secondary | ICD-10-CM | POA: Insufficient documentation

## 2022-01-12 NOTE — Telephone Encounter (Signed)
Oral Oncology Patient Advocate Encounter  Prior Authorization for Abiraterone has been approved.    PA# [id number] Effective dates: 01/12/2022 through 01/12/2023  Patients co-pay is $0.    Jesus Ramos, CPhT-Adv Oncology Pharmacy Patient [place] Direct Number: [phone]  Fax: [phone]

## 2022-01-13 ENCOUNTER — Other Ambulatory Visit (HOSPITAL_COMMUNITY): Payer: Self-pay

## 2022-01-13 DIAGNOSIS — Z6841 Body Mass Index (BMI) 40.0 and over, adult: Secondary | ICD-10-CM | POA: Diagnosis not present

## 2022-01-13 DIAGNOSIS — H25811 Combined forms of age-related cataract, right eye: Secondary | ICD-10-CM | POA: Diagnosis not present

## 2022-01-13 DIAGNOSIS — H2511 Age-related nuclear cataract, right eye: Secondary | ICD-10-CM | POA: Diagnosis not present

## 2022-01-13 DIAGNOSIS — E669 Obesity, unspecified: Secondary | ICD-10-CM | POA: Diagnosis not present

## 2022-01-13 MED ORDER — CLOBETASOL PROPIONATE 0.05 % EX CREA
TOPICAL_CREAM | Freq: Two times a day (BID) | CUTANEOUS | 3 refills | Status: AC | PRN
  Filled 2022-01-13: qty 60, 30d supply, fill #0
  Filled 2022-04-12: qty 60, 30d supply, fill #1

## 2022-01-13 MED ORDER — KETOCONAZOLE 2 % EX CREA
TOPICAL_CREAM | Freq: Two times a day (BID) | CUTANEOUS | 3 refills | Status: AC | PRN
  Filled 2022-01-13: qty 60, 30d supply, fill #0
  Filled 2022-04-12 (×2): qty 60, 30d supply, fill #1

## 2022-01-13 MED ORDER — FLUTICASONE PROPIONATE 0.05 % EX CREA
TOPICAL_CREAM | Freq: Two times a day (BID) | CUTANEOUS | 3 refills | Status: AC
  Filled 2022-01-13: qty 60, 30d supply, fill #0
  Filled 2022-04-12: qty 60, 30d supply, fill #1

## 2022-01-17 ENCOUNTER — Other Ambulatory Visit (HOSPITAL_COMMUNITY): Payer: Self-pay

## 2022-01-17 NOTE — Telephone Encounter (Signed)
Oral Chemotherapy Pharmacist Encounter  I spoke with patient for overview of: Zytiga for the treatment of advanced, castration-sensitive prostate cancer in conjunction with prednisone, planned duration until disease progression or unacceptable toxicity.   Counseled patient on administration, dosing, side effects, monitoring, drug-food interactions, safe handling, storage, and disposal.  Patient will take Zytiga '250mg'$  tablets, 4 tablets ('1000mg'$ ) by mouth once daily on an empty stomach, 1 hour before or 2 hours after a meal.  Patient states he will take his Zytiga and will wait at least 1 hour before eating or 2 hours after a meal.  Patient will take prednisone '5mg'$  tablet, 1 tablet by mouth one daily with breakfast.  Zytiga start date: 01/19/2022  Adverse effects include but are not limited to: peripheral edema, GI upset, hypertension, hot flashes, fatigue, and arthralgias.    Prednisone prescription has been sent to [facility] on 01/11/22. Patient will obtain prednisone and knows to start prednisone on the same day as Zytiga start.  Reviewed with patient importance of keeping a medication schedule and plan for any missed doses. No barriers to medication adherence identified.  Medication reconciliation performed and medication/allergy list updated.  Insurance authorization for Jesus Ramos has been obtained. Test claim at the pharmacy revealed copayment $0 for 1st fill of 30 days. This will ship from the [facility] on 01/17/22 to deliver to patient's home on 01/18/22.  Patient informed the pharmacy will reach out 5-7 days prior to needing next fill of Zytiga to coordinate continued medication acquisition to prevent break in therapy.  All questions answered.  Dr. Gala Romney voiced understanding and appreciation.   Medication education handout placed in mail for patient. Patient knows to call the office with questions or concerns. Oral Chemotherapy Clinic phone number  provided to patient.   Drema Halon, PharmD Hematology/Oncology Clinical Pharmacist [facility] [phone] 01/17/2022 1:22 PM

## 2022-01-18 ENCOUNTER — Ambulatory Visit
Admission: RE | Admit: 2022-01-18 | Discharge: 2022-01-18 | Disposition: A | Discharge status: Home / Self Care | Payer: 59 | Source: Ambulatory Visit | Attending: Radiation Oncology | Admitting: Radiation Oncology

## 2022-01-18 ENCOUNTER — Inpatient Hospital Stay: Payer: 59 | Attending: Oncology

## 2022-01-18 VITALS — BP 135/86 | HR 67 | Temp 98.5°F | Resp 20

## 2022-01-18 DIAGNOSIS — C61 Malignant neoplasm of prostate: Secondary | ICD-10-CM | POA: Insufficient documentation

## 2022-01-18 DIAGNOSIS — Z510 Encounter for antineoplastic radiation therapy: Secondary | ICD-10-CM | POA: Insufficient documentation

## 2022-01-18 DIAGNOSIS — C7951 Secondary malignant neoplasm of bone: Secondary | ICD-10-CM | POA: Diagnosis not present

## 2022-01-18 DIAGNOSIS — Z191 Hormone sensitive malignancy status: Secondary | ICD-10-CM | POA: Diagnosis not present

## 2022-01-18 DIAGNOSIS — C775 Secondary and unspecified malignant neoplasm of intrapelvic lymph nodes: Secondary | ICD-10-CM | POA: Diagnosis not present

## 2022-01-18 DIAGNOSIS — Z79818 Long term (current) use of other agents affecting estrogen receptors and estrogen levels: Secondary | ICD-10-CM | POA: Diagnosis not present

## 2022-01-18 MED ORDER — LEUPROLIDE ACETATE (4 MONTH) 30 MG ~~LOC~~ KIT
30.0000 mg | PACK | Freq: Once | SUBCUTANEOUS | Status: AC
  Administered 2022-01-18: 30 mg via SUBCUTANEOUS
  Filled 2022-01-18: qty 30

## 2022-01-18 NOTE — Progress Notes (Signed)
  Radiation Oncology         [phone] ________________________________  Name: Jesus Richards, MD MRN: [id number]  Date: 01/18/2022  DOB: May 03, 1955  STEREOTACTIC BODY RADIOTHERAPY SIMULATION AND TREATMENT PLANNING NOTE    ICD-10-CM   1. Malignant neoplasm of prostate metastatic to bone [place])  C61    C79.51       DIAGNOSIS:  66 y.o. gentleman with oligometastatic adenocarcinoma of the prostate involving pelvic lymph nodes and a solitary bony metastasis in the right acromion with a current PSA of 0.6 s/p RALP 05/2021 for stage pT3a pN0 Gleason 4+5 prostate cancer  NARRATIVE:  The patient was brought to the [facility].  Identity was confirmed.  All relevant records and images related to the planned course of therapy were reviewed.  The patient freely provided informed written consent to proceed with treatment after reviewing the details related to the planned course of therapy. The consent form was witnessed and verified by the simulation staff.  Then, the patient was set-up in a stable reproducible  supine position for radiation therapy.  A BodyFix immobilization pillow was fabricated for reproducible positioning.  Then I personally applied the abdominal compression paddle to limit respiratory excursion.  4D respiratoy motion management CT images were obtained.  Surface markings were placed.  The CT images were loaded into the planning software.  Then, using Cine, MIP, and standard views, the internal target volume (ITV) and planning target volumes (PTV) were delinieated, and avoidance structures were contoured.  Treatment planning then occurred.  The radiation prescription was entered and confirmed.  A total of two complex treatment devices were fabricated in the form of the BodyFix immobilization pillow and a neck accuform cushion.  I have requested : 3D Simulation  I have requested a DVH of the following structures: Heart, Lungs, Esophagus, Chest Wall, Brachial Plexus,  Major Blood Vessels, and targets.  SPECIAL TREATMENT PROCEDURE:  The planned course of therapy using radiation constitutes a special treatment procedure. Special care is required in the management of this patient for the following reasons. This treatment constitutes a Special Treatment Procedure for the following reason: [ High dose per fraction requiring special monitoring for increased toxicities of treatment including daily imaging..  The special nature of the planned course of radiotherapy will require increased physician supervision and oversight to ensure patient's safety with optimal treatment outcomes.  This requires extended time and effort.    RESPIRATORY MOTION MANAGEMENT SIMULATION:  In order to account for effect of respiratory motion on target structures and other organs in the planning and delivery of radiotherapy, this patient underwent respiratory motion management simulation.  To accomplish this, when the patient was brought to the CT simulation planning suite, 4D respiratoy motion management CT images were obtained.  The CT images were loaded into the planning software.  Then, using a variety of tools including Cine, MIP, and standard views, the target volume and planning target volumes (PTV) were delineated.  Avoidance structures were contoured.  Treatment planning then occurred.  Dose volume histograms were generated and reviewed for each of the requested structure.  The resulting plan was carefully reviewed and approved today.  PLAN:  The oligometastatic bony lesion in the right acromion will be treated to 40 Gy in 5 fractions simultaneous to IMRT to the prostate fossa and pelvic nodes.  ________________________________  Sheral Apley Tammi Klippel, M.D.

## 2022-01-18 NOTE — Progress Notes (Signed)
  Radiation Oncology         [phone]) [phone] ________________________________  Name: [name], MD MRN: [id number]  Date: 01/18/2022  DOB: Jan 07, 1956  SIMULATION AND TREATMENT PLANNING NOTE    ICD-10-CM   1. Malignant neoplasm of prostate metastatic to bone ([place])  C61    C79.51     2. Malignant neoplasm of prostate metastatic to intrapelvic lymph node ([name])  C61    C77.5     3. Biochemically recurrent malignant neoplasm of prostate [facility])  C61    R97.21       DIAGNOSIS:  66 y.o. gentleman with oligometastatic adenocarcinoma of the prostate involving pelvic lymph nodes and a solitary bony metastasis with a current PSA of 0.6 s/p RALP 05/2021 for stage pT3a pN0 Gleason 4+5 prostate cancer  NARRATIVE:  The patient was brought to the [place].  Identity was confirmed.  All relevant records and images related to the planned course of therapy were reviewed.  The patient freely provided informed written consent to proceed with treatment after reviewing the details related to the planned course of therapy. The consent form was witnessed and verified by the simulation staff.  Then, the patient was set-up in a stable reproducible supine position for radiation therapy.  A vacuum lock pillow device was custom fabricated to position his legs in a reproducible immobilized position.  Then, I performed a urethrogram under sterile conditions to identify the prostatic apex.  CT images were obtained.  Surface markings were placed.  The CT images were loaded into the planning software.  Then the prostate target and avoidance structures including the rectum, bladder, bowel and hips were contoured.  Treatment planning then occurred.  The radiation prescription was entered and confirmed.  A total of one complex treatment devices was fabricated. I have requested : Intensity Modulated Radiotherapy (IMRT) is medically necessary for this case for the following reason:  Rectal sparing.Marland Kitchen  PLAN:    The prostate fossa and pelvic lymph nodes will initially be treated to 45 Gy in 25 fractions of 1.8 Gy followed by a boost to the fossa and PET positive nodes, to 68.4 Gy with 13 additional fractions of 1.8 Gy while simultaneously treating the oligometastatic bony metastasis in the right acromion with 5 fractions of SBRT.  ________________________________  Sheral Apley Tammi Klippel, M.D.

## 2022-01-18 NOTE — Progress Notes (Signed)
RN left voicemail for patient to follow up from [facility] on 01/11/2022 to assess any navigation needs.   Plan of care on going at this time.

## 2022-01-19 ENCOUNTER — Other Ambulatory Visit (HOSPITAL_COMMUNITY): Payer: Self-pay

## 2022-01-20 ENCOUNTER — Other Ambulatory Visit (HOSPITAL_COMMUNITY): Payer: Self-pay

## 2022-01-21 DIAGNOSIS — C775 Secondary and unspecified malignant neoplasm of intrapelvic lymph nodes: Secondary | ICD-10-CM | POA: Diagnosis not present

## 2022-01-21 DIAGNOSIS — Z79818 Long term (current) use of other agents affecting estrogen receptors and estrogen levels: Secondary | ICD-10-CM | POA: Diagnosis not present

## 2022-01-21 DIAGNOSIS — Z191 Hormone sensitive malignancy status: Secondary | ICD-10-CM | POA: Diagnosis not present

## 2022-01-21 DIAGNOSIS — C61 Malignant neoplasm of prostate: Secondary | ICD-10-CM | POA: Diagnosis not present

## 2022-01-21 DIAGNOSIS — Z510 Encounter for antineoplastic radiation therapy: Secondary | ICD-10-CM | POA: Diagnosis not present

## 2022-01-24 ENCOUNTER — Other Ambulatory Visit: Payer: Self-pay

## 2022-01-24 ENCOUNTER — Ambulatory Visit
Admission: RE | Admit: 2022-01-24 | Discharge: 2022-01-24 | Disposition: A | Discharge status: Home / Self Care | Payer: 59 | Source: Ambulatory Visit | Attending: Radiation Oncology | Admitting: Radiation Oncology

## 2022-01-24 DIAGNOSIS — C61 Malignant neoplasm of prostate: Secondary | ICD-10-CM | POA: Diagnosis not present

## 2022-01-24 DIAGNOSIS — Z79818 Long term (current) use of other agents affecting estrogen receptors and estrogen levels: Secondary | ICD-10-CM | POA: Diagnosis not present

## 2022-01-24 DIAGNOSIS — Z510 Encounter for antineoplastic radiation therapy: Secondary | ICD-10-CM | POA: Diagnosis not present

## 2022-01-24 DIAGNOSIS — Z191 Hormone sensitive malignancy status: Secondary | ICD-10-CM | POA: Diagnosis not present

## 2022-01-24 DIAGNOSIS — C775 Secondary and unspecified malignant neoplasm of intrapelvic lymph nodes: Secondary | ICD-10-CM | POA: Diagnosis not present

## 2022-01-24 DIAGNOSIS — C7951 Secondary malignant neoplasm of bone: Secondary | ICD-10-CM | POA: Diagnosis not present

## 2022-01-24 LAB — RAD ONC ARIA SESSION SUMMARY
Course Elapsed Days: 0
Plan Fractions Treated to Date: 1
Plan Prescribed Dose Per Fraction: 1.8 Gy
Plan Total Fractions Prescribed: 25
Plan Total Prescribed Dose: 45 Gy
Reference Point Dosage Given to Date: 1.8 Gy
Reference Point Session Dosage Given: 1.8 Gy
Session Number: 1

## 2022-01-25 ENCOUNTER — Ambulatory Visit
Admission: RE | Admit: 2022-01-25 | Discharge: 2022-01-25 | Disposition: A | Discharge status: Home / Self Care | Payer: 59 | Source: Ambulatory Visit | Attending: Radiation Oncology | Admitting: Radiation Oncology

## 2022-01-25 ENCOUNTER — Other Ambulatory Visit: Payer: Self-pay

## 2022-01-25 DIAGNOSIS — Z510 Encounter for antineoplastic radiation therapy: Secondary | ICD-10-CM | POA: Diagnosis not present

## 2022-01-25 DIAGNOSIS — Z79818 Long term (current) use of other agents affecting estrogen receptors and estrogen levels: Secondary | ICD-10-CM | POA: Diagnosis not present

## 2022-01-25 DIAGNOSIS — Z191 Hormone sensitive malignancy status: Secondary | ICD-10-CM | POA: Diagnosis not present

## 2022-01-25 DIAGNOSIS — C775 Secondary and unspecified malignant neoplasm of intrapelvic lymph nodes: Secondary | ICD-10-CM | POA: Diagnosis not present

## 2022-01-25 DIAGNOSIS — C61 Malignant neoplasm of prostate: Secondary | ICD-10-CM

## 2022-01-25 LAB — RAD ONC ARIA SESSION SUMMARY
Course Elapsed Days: 1
Plan Fractions Treated to Date: 1
Plan Prescribed Dose Per Fraction: 8 Gy
Plan Total Fractions Prescribed: 5
Plan Total Prescribed Dose: 40 Gy
Reference Point Dosage Given to Date: 8 Gy
Reference Point Session Dosage Given: 8 Gy
Session Number: 2

## 2022-01-26 ENCOUNTER — Ambulatory Visit: Payer: 59 | Admitting: Radiation Oncology

## 2022-01-26 ENCOUNTER — Ambulatory Visit
Admission: RE | Admit: 2022-01-26 | Discharge: 2022-01-26 | Disposition: A | Discharge status: Home / Self Care | Payer: 59 | Source: Ambulatory Visit | Attending: Radiation Oncology | Admitting: Radiation Oncology

## 2022-01-26 ENCOUNTER — Other Ambulatory Visit: Payer: Self-pay

## 2022-01-26 DIAGNOSIS — Z510 Encounter for antineoplastic radiation therapy: Secondary | ICD-10-CM | POA: Diagnosis not present

## 2022-01-26 DIAGNOSIS — C61 Malignant neoplasm of prostate: Secondary | ICD-10-CM | POA: Diagnosis not present

## 2022-01-26 DIAGNOSIS — Z191 Hormone sensitive malignancy status: Secondary | ICD-10-CM | POA: Diagnosis not present

## 2022-01-26 DIAGNOSIS — Z79818 Long term (current) use of other agents affecting estrogen receptors and estrogen levels: Secondary | ICD-10-CM | POA: Diagnosis not present

## 2022-01-26 DIAGNOSIS — C775 Secondary and unspecified malignant neoplasm of intrapelvic lymph nodes: Secondary | ICD-10-CM | POA: Diagnosis not present

## 2022-01-26 LAB — RAD ONC ARIA SESSION SUMMARY
Course Elapsed Days: 2
Plan Fractions Treated to Date: 3
Plan Prescribed Dose Per Fraction: 1.8 Gy
Plan Total Fractions Prescribed: 25
Plan Total Prescribed Dose: 45 Gy
Reference Point Dosage Given to Date: 5.4 Gy
Reference Point Session Dosage Given: 1.8 Gy
Session Number: 3

## 2022-01-27 ENCOUNTER — Other Ambulatory Visit: Payer: Self-pay

## 2022-01-27 ENCOUNTER — Ambulatory Visit
Admission: RE | Admit: 2022-01-27 | Discharge: 2022-01-27 | Disposition: A | Discharge status: Home / Self Care | Payer: 59 | Source: Ambulatory Visit | Attending: Radiation Oncology | Admitting: Radiation Oncology

## 2022-01-27 DIAGNOSIS — C61 Malignant neoplasm of prostate: Secondary | ICD-10-CM | POA: Diagnosis not present

## 2022-01-27 DIAGNOSIS — Z79818 Long term (current) use of other agents affecting estrogen receptors and estrogen levels: Secondary | ICD-10-CM | POA: Diagnosis not present

## 2022-01-27 DIAGNOSIS — Z510 Encounter for antineoplastic radiation therapy: Secondary | ICD-10-CM | POA: Diagnosis not present

## 2022-01-27 DIAGNOSIS — Z191 Hormone sensitive malignancy status: Secondary | ICD-10-CM | POA: Diagnosis not present

## 2022-01-27 DIAGNOSIS — C775 Secondary and unspecified malignant neoplasm of intrapelvic lymph nodes: Secondary | ICD-10-CM | POA: Diagnosis not present

## 2022-01-27 LAB — RAD ONC ARIA SESSION SUMMARY
Course Elapsed Days: 3
Plan Fractions Treated to Date: 2
Plan Prescribed Dose Per Fraction: 8 Gy
Plan Total Fractions Prescribed: 5
Plan Total Prescribed Dose: 40 Gy
Reference Point Dosage Given to Date: 16 Gy
Reference Point Session Dosage Given: 8 Gy
Session Number: 4

## 2022-01-28 ENCOUNTER — Ambulatory Visit: Payer: 59 | Admitting: Radiation Oncology

## 2022-01-28 ENCOUNTER — Ambulatory Visit
Admission: RE | Admit: 2022-01-28 | Discharge: 2022-01-28 | Disposition: A | Discharge status: Home / Self Care | Payer: 59 | Source: Ambulatory Visit | Attending: Radiation Oncology | Admitting: Radiation Oncology

## 2022-01-28 ENCOUNTER — Other Ambulatory Visit: Payer: Self-pay

## 2022-01-28 ENCOUNTER — Other Ambulatory Visit: Payer: Self-pay | Admitting: Cardiology

## 2022-01-28 ENCOUNTER — Other Ambulatory Visit (HOSPITAL_COMMUNITY): Payer: Self-pay

## 2022-01-28 DIAGNOSIS — Z510 Encounter for antineoplastic radiation therapy: Secondary | ICD-10-CM | POA: Diagnosis not present

## 2022-01-28 DIAGNOSIS — C61 Malignant neoplasm of prostate: Secondary | ICD-10-CM | POA: Diagnosis not present

## 2022-01-28 DIAGNOSIS — Z191 Hormone sensitive malignancy status: Secondary | ICD-10-CM | POA: Diagnosis not present

## 2022-01-28 DIAGNOSIS — C775 Secondary and unspecified malignant neoplasm of intrapelvic lymph nodes: Secondary | ICD-10-CM | POA: Diagnosis not present

## 2022-01-28 DIAGNOSIS — Z79818 Long term (current) use of other agents affecting estrogen receptors and estrogen levels: Secondary | ICD-10-CM | POA: Diagnosis not present

## 2022-01-28 LAB — RAD ONC ARIA SESSION SUMMARY
Course Elapsed Days: 4
Plan Fractions Treated to Date: 5
Plan Prescribed Dose Per Fraction: 1.8 Gy
Plan Total Fractions Prescribed: 25
Plan Total Prescribed Dose: 45 Gy
Reference Point Dosage Given to Date: 9 Gy
Reference Point Session Dosage Given: 1.8 Gy
Session Number: 5

## 2022-01-28 MED ORDER — PREDNISOLONE ACETATE 1 % OP SUSP
1.0000 [drp] | Freq: Two times a day (BID) | OPHTHALMIC | 0 refills | Status: AC
  Filled 2022-01-28: qty 5, 50d supply, fill #0

## 2022-01-31 ENCOUNTER — Other Ambulatory Visit: Payer: Self-pay

## 2022-01-31 ENCOUNTER — Ambulatory Visit
Admission: RE | Admit: 2022-01-31 | Discharge: 2022-01-31 | Disposition: A | Discharge status: Home / Self Care | Payer: 59 | Source: Ambulatory Visit | Attending: Radiation Oncology | Admitting: Radiation Oncology

## 2022-01-31 ENCOUNTER — Other Ambulatory Visit (HOSPITAL_COMMUNITY): Payer: Self-pay

## 2022-01-31 DIAGNOSIS — C61 Malignant neoplasm of prostate: Secondary | ICD-10-CM | POA: Diagnosis not present

## 2022-01-31 DIAGNOSIS — C775 Secondary and unspecified malignant neoplasm of intrapelvic lymph nodes: Secondary | ICD-10-CM | POA: Diagnosis not present

## 2022-01-31 DIAGNOSIS — Z79818 Long term (current) use of other agents affecting estrogen receptors and estrogen levels: Secondary | ICD-10-CM | POA: Diagnosis not present

## 2022-01-31 DIAGNOSIS — Z510 Encounter for antineoplastic radiation therapy: Secondary | ICD-10-CM | POA: Diagnosis not present

## 2022-01-31 DIAGNOSIS — Z191 Hormone sensitive malignancy status: Secondary | ICD-10-CM | POA: Diagnosis not present

## 2022-01-31 LAB — RAD ONC ARIA SESSION SUMMARY
Course Elapsed Days: 7
Plan Fractions Treated to Date: 3
Plan Prescribed Dose Per Fraction: 8 Gy
Plan Total Fractions Prescribed: 5
Plan Total Prescribed Dose: 40 Gy
Reference Point Dosage Given to Date: 24 Gy
Reference Point Session Dosage Given: 8 Gy
Session Number: 6

## 2022-01-31 MED ORDER — ROSUVASTATIN CALCIUM 20 MG PO TABS
20.0000 mg | ORAL_TABLET | Freq: Every day | ORAL | 3 refills | Status: DC
  Filled 2022-01-31: qty 90, 90d supply, fill #0
  Filled 2022-05-24: qty 90, 90d supply, fill #1
  Filled 2022-09-01: qty 90, 90d supply, fill #2
  Filled 2022-09-06: qty 90, 90d supply, fill #0
  Filled 2022-09-06: qty 90, 90d supply, fill #2
  Filled 2022-11-08 – 2023-01-08 (×2): qty 90, 90d supply, fill #1

## 2022-02-01 ENCOUNTER — Other Ambulatory Visit (HOSPITAL_COMMUNITY): Payer: Self-pay

## 2022-02-01 ENCOUNTER — Ambulatory Visit: Payer: 59

## 2022-02-02 ENCOUNTER — Ambulatory Visit
Admission: RE | Admit: 2022-02-02 | Discharge: 2022-02-02 | Disposition: A | Discharge status: Home / Self Care | Payer: 59 | Source: Ambulatory Visit | Attending: Radiation Oncology | Admitting: Radiation Oncology

## 2022-02-02 ENCOUNTER — Ambulatory Visit: Admission: RE | Admit: 2022-02-02 | Payer: 59 | Source: Ambulatory Visit | Admitting: Radiation Oncology

## 2022-02-02 ENCOUNTER — Other Ambulatory Visit: Payer: Self-pay

## 2022-02-02 DIAGNOSIS — Z191 Hormone sensitive malignancy status: Secondary | ICD-10-CM | POA: Diagnosis not present

## 2022-02-02 DIAGNOSIS — C775 Secondary and unspecified malignant neoplasm of intrapelvic lymph nodes: Secondary | ICD-10-CM | POA: Diagnosis not present

## 2022-02-02 DIAGNOSIS — Z79818 Long term (current) use of other agents affecting estrogen receptors and estrogen levels: Secondary | ICD-10-CM | POA: Diagnosis not present

## 2022-02-02 DIAGNOSIS — C61 Malignant neoplasm of prostate: Secondary | ICD-10-CM | POA: Diagnosis not present

## 2022-02-02 DIAGNOSIS — Z510 Encounter for antineoplastic radiation therapy: Secondary | ICD-10-CM | POA: Diagnosis not present

## 2022-02-02 LAB — RAD ONC ARIA SESSION SUMMARY
Course Elapsed Days: 9
Plan Fractions Treated to Date: 7
Plan Prescribed Dose Per Fraction: 1.8 Gy
Plan Total Fractions Prescribed: 25
Plan Total Prescribed Dose: 45 Gy
Reference Point Dosage Given to Date: 12.6 Gy
Reference Point Session Dosage Given: 1.8 Gy
Session Number: 7

## 2022-02-03 ENCOUNTER — Ambulatory Visit
Admission: RE | Admit: 2022-02-03 | Discharge: 2022-02-03 | Disposition: A | Discharge status: Home / Self Care | Payer: 59 | Source: Ambulatory Visit | Attending: Radiation Oncology | Admitting: Radiation Oncology

## 2022-02-03 ENCOUNTER — Other Ambulatory Visit: Payer: Self-pay

## 2022-02-03 DIAGNOSIS — Z79818 Long term (current) use of other agents affecting estrogen receptors and estrogen levels: Secondary | ICD-10-CM | POA: Diagnosis not present

## 2022-02-03 DIAGNOSIS — Z191 Hormone sensitive malignancy status: Secondary | ICD-10-CM | POA: Diagnosis not present

## 2022-02-03 DIAGNOSIS — C61 Malignant neoplasm of prostate: Secondary | ICD-10-CM | POA: Diagnosis not present

## 2022-02-03 DIAGNOSIS — Z510 Encounter for antineoplastic radiation therapy: Secondary | ICD-10-CM | POA: Diagnosis not present

## 2022-02-03 DIAGNOSIS — C775 Secondary and unspecified malignant neoplasm of intrapelvic lymph nodes: Secondary | ICD-10-CM | POA: Diagnosis not present

## 2022-02-03 LAB — RAD ONC ARIA SESSION SUMMARY
Course Elapsed Days: 10
Plan Fractions Treated to Date: 8
Plan Prescribed Dose Per Fraction: 1.8 Gy
Plan Total Fractions Prescribed: 25
Plan Total Prescribed Dose: 45 Gy
Reference Point Dosage Given to Date: 14.4 Gy
Reference Point Session Dosage Given: 1.8 Gy
Session Number: 8

## 2022-02-04 ENCOUNTER — Other Ambulatory Visit (HOSPITAL_COMMUNITY): Payer: Self-pay

## 2022-02-04 ENCOUNTER — Ambulatory Visit
Admission: RE | Admit: 2022-02-04 | Discharge: 2022-02-04 | Disposition: A | Discharge status: Home / Self Care | Payer: 59 | Source: Ambulatory Visit | Attending: Radiation Oncology | Admitting: Radiation Oncology

## 2022-02-04 ENCOUNTER — Other Ambulatory Visit: Payer: Self-pay

## 2022-02-04 DIAGNOSIS — C61 Malignant neoplasm of prostate: Secondary | ICD-10-CM | POA: Diagnosis not present

## 2022-02-04 DIAGNOSIS — C775 Secondary and unspecified malignant neoplasm of intrapelvic lymph nodes: Secondary | ICD-10-CM | POA: Diagnosis not present

## 2022-02-04 DIAGNOSIS — Z510 Encounter for antineoplastic radiation therapy: Secondary | ICD-10-CM | POA: Diagnosis not present

## 2022-02-04 DIAGNOSIS — Z191 Hormone sensitive malignancy status: Secondary | ICD-10-CM | POA: Diagnosis not present

## 2022-02-04 DIAGNOSIS — Z79818 Long term (current) use of other agents affecting estrogen receptors and estrogen levels: Secondary | ICD-10-CM | POA: Diagnosis not present

## 2022-02-04 LAB — RAD ONC ARIA SESSION SUMMARY
Course Elapsed Days: 11
Plan Fractions Treated to Date: 9
Plan Prescribed Dose Per Fraction: 1.8 Gy
Plan Total Fractions Prescribed: 25
Plan Total Prescribed Dose: 45 Gy
Reference Point Dosage Given to Date: 16.2 Gy
Reference Point Session Dosage Given: 1.8 Gy
Session Number: 9

## 2022-02-07 ENCOUNTER — Ambulatory Visit
Admission: RE | Admit: 2022-02-07 | Discharge: 2022-02-07 | Disposition: A | Discharge status: Home / Self Care | Payer: 59 | Source: Ambulatory Visit | Attending: Radiation Oncology | Admitting: Radiation Oncology

## 2022-02-07 ENCOUNTER — Other Ambulatory Visit: Payer: Self-pay

## 2022-02-07 ENCOUNTER — Other Ambulatory Visit: Payer: Self-pay | Admitting: Oncology

## 2022-02-07 ENCOUNTER — Other Ambulatory Visit (HOSPITAL_COMMUNITY): Payer: Self-pay

## 2022-02-07 DIAGNOSIS — Z191 Hormone sensitive malignancy status: Secondary | ICD-10-CM | POA: Diagnosis not present

## 2022-02-07 DIAGNOSIS — C61 Malignant neoplasm of prostate: Secondary | ICD-10-CM | POA: Diagnosis not present

## 2022-02-07 DIAGNOSIS — C775 Secondary and unspecified malignant neoplasm of intrapelvic lymph nodes: Secondary | ICD-10-CM | POA: Diagnosis not present

## 2022-02-07 DIAGNOSIS — Z510 Encounter for antineoplastic radiation therapy: Secondary | ICD-10-CM | POA: Diagnosis not present

## 2022-02-07 DIAGNOSIS — Z79818 Long term (current) use of other agents affecting estrogen receptors and estrogen levels: Secondary | ICD-10-CM | POA: Diagnosis not present

## 2022-02-07 LAB — RAD ONC ARIA SESSION SUMMARY
Course Elapsed Days: 14
Plan Fractions Treated to Date: 10
Plan Prescribed Dose Per Fraction: 1.8 Gy
Plan Total Fractions Prescribed: 25
Plan Total Prescribed Dose: 45 Gy
Reference Point Dosage Given to Date: 18 Gy
Reference Point Session Dosage Given: 1.8 Gy
Session Number: 10

## 2022-02-07 MED ORDER — ABIRATERONE ACETATE 250 MG PO TABS
1000.0000 mg | ORAL_TABLET | Freq: Every day | ORAL | 0 refills | Status: DC
  Filled 2022-02-07: qty 120, 30d supply, fill #0

## 2022-02-08 ENCOUNTER — Other Ambulatory Visit (HOSPITAL_COMMUNITY): Payer: Self-pay

## 2022-02-08 ENCOUNTER — Other Ambulatory Visit: Payer: Self-pay

## 2022-02-08 ENCOUNTER — Ambulatory Visit
Admission: RE | Admit: 2022-02-08 | Discharge: 2022-02-08 | Disposition: A | Discharge status: Home / Self Care | Payer: 59 | Source: Ambulatory Visit | Attending: Radiation Oncology | Admitting: Radiation Oncology

## 2022-02-08 DIAGNOSIS — Z510 Encounter for antineoplastic radiation therapy: Secondary | ICD-10-CM | POA: Diagnosis not present

## 2022-02-08 DIAGNOSIS — C775 Secondary and unspecified malignant neoplasm of intrapelvic lymph nodes: Secondary | ICD-10-CM | POA: Diagnosis not present

## 2022-02-08 DIAGNOSIS — Z79818 Long term (current) use of other agents affecting estrogen receptors and estrogen levels: Secondary | ICD-10-CM | POA: Diagnosis not present

## 2022-02-08 DIAGNOSIS — C61 Malignant neoplasm of prostate: Secondary | ICD-10-CM | POA: Diagnosis not present

## 2022-02-08 DIAGNOSIS — Z191 Hormone sensitive malignancy status: Secondary | ICD-10-CM | POA: Diagnosis not present

## 2022-02-08 LAB — RAD ONC ARIA SESSION SUMMARY
Course Elapsed Days: 15
Plan Fractions Treated to Date: 11
Plan Prescribed Dose Per Fraction: 1.8 Gy
Plan Total Fractions Prescribed: 25
Plan Total Prescribed Dose: 45 Gy
Reference Point Dosage Given to Date: 19.8 Gy
Reference Point Session Dosage Given: 1.8 Gy
Session Number: 11

## 2022-02-09 ENCOUNTER — Other Ambulatory Visit: Payer: Self-pay

## 2022-02-09 ENCOUNTER — Ambulatory Visit
Admission: RE | Admit: 2022-02-09 | Discharge: 2022-02-09 | Disposition: A | Discharge status: Home / Self Care | Payer: 59 | Source: Ambulatory Visit | Attending: Radiation Oncology | Admitting: Radiation Oncology

## 2022-02-09 DIAGNOSIS — Z191 Hormone sensitive malignancy status: Secondary | ICD-10-CM | POA: Diagnosis not present

## 2022-02-09 DIAGNOSIS — C61 Malignant neoplasm of prostate: Secondary | ICD-10-CM | POA: Diagnosis not present

## 2022-02-09 DIAGNOSIS — Z79818 Long term (current) use of other agents affecting estrogen receptors and estrogen levels: Secondary | ICD-10-CM | POA: Diagnosis not present

## 2022-02-09 DIAGNOSIS — C775 Secondary and unspecified malignant neoplasm of intrapelvic lymph nodes: Secondary | ICD-10-CM | POA: Diagnosis not present

## 2022-02-09 DIAGNOSIS — Z510 Encounter for antineoplastic radiation therapy: Secondary | ICD-10-CM | POA: Diagnosis not present

## 2022-02-09 LAB — RAD ONC ARIA SESSION SUMMARY
Course Elapsed Days: 16
Plan Fractions Treated to Date: 12
Plan Prescribed Dose Per Fraction: 1.8 Gy
Plan Total Fractions Prescribed: 25
Plan Total Prescribed Dose: 45 Gy
Reference Point Dosage Given to Date: 21.6 Gy
Reference Point Session Dosage Given: 1.8 Gy
Session Number: 12

## 2022-02-10 ENCOUNTER — Ambulatory Visit
Admission: RE | Admit: 2022-02-10 | Discharge: 2022-02-10 | Disposition: A | Discharge status: Home / Self Care | Payer: 59 | Source: Ambulatory Visit | Attending: Radiation Oncology | Admitting: Radiation Oncology

## 2022-02-10 ENCOUNTER — Other Ambulatory Visit: Payer: Self-pay

## 2022-02-10 ENCOUNTER — Other Ambulatory Visit (HOSPITAL_COMMUNITY): Payer: Self-pay

## 2022-02-10 DIAGNOSIS — Z191 Hormone sensitive malignancy status: Secondary | ICD-10-CM | POA: Diagnosis not present

## 2022-02-10 DIAGNOSIS — C775 Secondary and unspecified malignant neoplasm of intrapelvic lymph nodes: Secondary | ICD-10-CM | POA: Diagnosis not present

## 2022-02-10 DIAGNOSIS — C61 Malignant neoplasm of prostate: Secondary | ICD-10-CM | POA: Diagnosis not present

## 2022-02-10 DIAGNOSIS — Z79818 Long term (current) use of other agents affecting estrogen receptors and estrogen levels: Secondary | ICD-10-CM | POA: Diagnosis not present

## 2022-02-10 DIAGNOSIS — Z510 Encounter for antineoplastic radiation therapy: Secondary | ICD-10-CM | POA: Diagnosis not present

## 2022-02-10 LAB — RAD ONC ARIA SESSION SUMMARY
Course Elapsed Days: 17
Plan Fractions Treated to Date: 13
Plan Prescribed Dose Per Fraction: 1.8 Gy
Plan Total Fractions Prescribed: 25
Plan Total Prescribed Dose: 45 Gy
Reference Point Dosage Given to Date: 23.4 Gy
Reference Point Session Dosage Given: 1.8 Gy
Session Number: 13

## 2022-02-11 ENCOUNTER — Other Ambulatory Visit: Payer: Self-pay

## 2022-02-11 ENCOUNTER — Ambulatory Visit
Admission: RE | Admit: 2022-02-11 | Discharge: 2022-02-11 | Disposition: A | Discharge status: Home / Self Care | Payer: 59 | Source: Ambulatory Visit | Attending: Radiation Oncology | Admitting: Radiation Oncology

## 2022-02-11 ENCOUNTER — Other Ambulatory Visit (HOSPITAL_COMMUNITY): Payer: Self-pay

## 2022-02-11 DIAGNOSIS — Z79818 Long term (current) use of other agents affecting estrogen receptors and estrogen levels: Secondary | ICD-10-CM | POA: Diagnosis not present

## 2022-02-11 DIAGNOSIS — C61 Malignant neoplasm of prostate: Secondary | ICD-10-CM | POA: Diagnosis not present

## 2022-02-11 DIAGNOSIS — Z510 Encounter for antineoplastic radiation therapy: Secondary | ICD-10-CM | POA: Diagnosis not present

## 2022-02-11 DIAGNOSIS — Z191 Hormone sensitive malignancy status: Secondary | ICD-10-CM | POA: Diagnosis not present

## 2022-02-11 DIAGNOSIS — C775 Secondary and unspecified malignant neoplasm of intrapelvic lymph nodes: Secondary | ICD-10-CM | POA: Diagnosis not present

## 2022-02-11 LAB — RAD ONC ARIA SESSION SUMMARY
Course Elapsed Days: 18
Plan Fractions Treated to Date: 14
Plan Prescribed Dose Per Fraction: 1.8 Gy
Plan Total Fractions Prescribed: 25
Plan Total Prescribed Dose: 45 Gy
Reference Point Dosage Given to Date: 25.2 Gy
Reference Point Session Dosage Given: 1.8 Gy
Session Number: 14

## 2022-02-14 ENCOUNTER — Other Ambulatory Visit: Payer: Self-pay

## 2022-02-14 ENCOUNTER — Ambulatory Visit
Admission: RE | Admit: 2022-02-14 | Discharge: 2022-02-14 | Disposition: A | Discharge status: Home / Self Care | Payer: 59 | Source: Ambulatory Visit | Attending: Radiation Oncology | Admitting: Radiation Oncology

## 2022-02-14 DIAGNOSIS — Z191 Hormone sensitive malignancy status: Secondary | ICD-10-CM | POA: Diagnosis not present

## 2022-02-14 DIAGNOSIS — Z510 Encounter for antineoplastic radiation therapy: Secondary | ICD-10-CM | POA: Diagnosis not present

## 2022-02-14 DIAGNOSIS — C61 Malignant neoplasm of prostate: Secondary | ICD-10-CM | POA: Diagnosis not present

## 2022-02-14 DIAGNOSIS — C775 Secondary and unspecified malignant neoplasm of intrapelvic lymph nodes: Secondary | ICD-10-CM | POA: Diagnosis not present

## 2022-02-14 DIAGNOSIS — Z79818 Long term (current) use of other agents affecting estrogen receptors and estrogen levels: Secondary | ICD-10-CM | POA: Diagnosis not present

## 2022-02-14 LAB — RAD ONC ARIA SESSION SUMMARY
Course Elapsed Days: 21
Plan Fractions Treated to Date: 15
Plan Prescribed Dose Per Fraction: 1.8 Gy
Plan Total Fractions Prescribed: 25
Plan Total Prescribed Dose: 45 Gy
Reference Point Dosage Given to Date: 27 Gy
Reference Point Session Dosage Given: 1.8 Gy
Session Number: 15

## 2022-02-15 ENCOUNTER — Other Ambulatory Visit: Payer: Self-pay

## 2022-02-15 ENCOUNTER — Ambulatory Visit
Admission: RE | Admit: 2022-02-15 | Discharge: 2022-02-15 | Disposition: A | Discharge status: Home / Self Care | Payer: 59 | Source: Ambulatory Visit | Attending: Radiation Oncology | Admitting: Radiation Oncology

## 2022-02-15 DIAGNOSIS — C775 Secondary and unspecified malignant neoplasm of intrapelvic lymph nodes: Secondary | ICD-10-CM | POA: Diagnosis not present

## 2022-02-15 DIAGNOSIS — Z510 Encounter for antineoplastic radiation therapy: Secondary | ICD-10-CM | POA: Diagnosis not present

## 2022-02-15 DIAGNOSIS — Z191 Hormone sensitive malignancy status: Secondary | ICD-10-CM | POA: Diagnosis not present

## 2022-02-15 DIAGNOSIS — Z79818 Long term (current) use of other agents affecting estrogen receptors and estrogen levels: Secondary | ICD-10-CM | POA: Diagnosis not present

## 2022-02-15 DIAGNOSIS — C61 Malignant neoplasm of prostate: Secondary | ICD-10-CM | POA: Diagnosis not present

## 2022-02-15 LAB — RAD ONC ARIA SESSION SUMMARY
Course Elapsed Days: 22
Plan Fractions Treated to Date: 16
Plan Prescribed Dose Per Fraction: 1.8 Gy
Plan Total Fractions Prescribed: 25
Plan Total Prescribed Dose: 45 Gy
Reference Point Dosage Given to Date: 28.8 Gy
Reference Point Session Dosage Given: 1.8 Gy
Session Number: 16

## 2022-02-16 ENCOUNTER — Other Ambulatory Visit: Payer: Self-pay

## 2022-02-16 ENCOUNTER — Ambulatory Visit
Admission: RE | Admit: 2022-02-16 | Discharge: 2022-02-16 | Disposition: A | Discharge status: Home / Self Care | Payer: 59 | Source: Ambulatory Visit | Attending: Radiation Oncology | Admitting: Radiation Oncology

## 2022-02-16 DIAGNOSIS — C61 Malignant neoplasm of prostate: Secondary | ICD-10-CM | POA: Insufficient documentation

## 2022-02-16 DIAGNOSIS — Z510 Encounter for antineoplastic radiation therapy: Secondary | ICD-10-CM | POA: Insufficient documentation

## 2022-02-16 DIAGNOSIS — Z191 Hormone sensitive malignancy status: Secondary | ICD-10-CM | POA: Diagnosis not present

## 2022-02-16 LAB — RAD ONC ARIA SESSION SUMMARY
Course Elapsed Days: 23
Plan Fractions Treated to Date: 17
Plan Prescribed Dose Per Fraction: 1.8 Gy
Plan Total Fractions Prescribed: 25
Plan Total Prescribed Dose: 45 Gy
Reference Point Dosage Given to Date: 30.6 Gy
Reference Point Session Dosage Given: 1.8 Gy
Session Number: 17

## 2022-02-17 ENCOUNTER — Ambulatory Visit: Payer: 59

## 2022-02-17 ENCOUNTER — Ambulatory Visit
Admission: RE | Admit: 2022-02-17 | Discharge: 2022-02-17 | Disposition: A | Discharge status: Home / Self Care | Payer: 59 | Source: Ambulatory Visit | Attending: Radiation Oncology | Admitting: Radiation Oncology

## 2022-02-17 ENCOUNTER — Other Ambulatory Visit: Payer: Self-pay

## 2022-02-17 DIAGNOSIS — Z510 Encounter for antineoplastic radiation therapy: Secondary | ICD-10-CM | POA: Diagnosis not present

## 2022-02-17 DIAGNOSIS — C61 Malignant neoplasm of prostate: Secondary | ICD-10-CM | POA: Diagnosis not present

## 2022-02-17 DIAGNOSIS — Z191 Hormone sensitive malignancy status: Secondary | ICD-10-CM | POA: Diagnosis not present

## 2022-02-17 LAB — RAD ONC ARIA SESSION SUMMARY
Course Elapsed Days: 24
Plan Fractions Treated to Date: 18
Plan Prescribed Dose Per Fraction: 1.8 Gy
Plan Total Fractions Prescribed: 25
Plan Total Prescribed Dose: 45 Gy
Reference Point Dosage Given to Date: 32.4 Gy
Reference Point Session Dosage Given: 1.8 Gy
Session Number: 18

## 2022-02-18 ENCOUNTER — Other Ambulatory Visit: Payer: Self-pay

## 2022-02-18 ENCOUNTER — Ambulatory Visit
Admission: RE | Admit: 2022-02-18 | Discharge: 2022-02-18 | Disposition: A | Discharge status: Home / Self Care | Payer: 59 | Source: Ambulatory Visit | Attending: Radiation Oncology | Admitting: Radiation Oncology

## 2022-02-18 DIAGNOSIS — Z510 Encounter for antineoplastic radiation therapy: Secondary | ICD-10-CM | POA: Diagnosis not present

## 2022-02-18 DIAGNOSIS — C61 Malignant neoplasm of prostate: Secondary | ICD-10-CM | POA: Diagnosis not present

## 2022-02-18 DIAGNOSIS — Z191 Hormone sensitive malignancy status: Secondary | ICD-10-CM | POA: Diagnosis not present

## 2022-02-18 LAB — RAD ONC ARIA SESSION SUMMARY
Course Elapsed Days: 25
Plan Fractions Treated to Date: 19
Plan Prescribed Dose Per Fraction: 1.8 Gy
Plan Total Fractions Prescribed: 25
Plan Total Prescribed Dose: 45 Gy
Reference Point Dosage Given to Date: 34.2 Gy
Reference Point Session Dosage Given: 1.8 Gy
Session Number: 19

## 2022-02-21 ENCOUNTER — Other Ambulatory Visit: Payer: Self-pay

## 2022-02-21 ENCOUNTER — Ambulatory Visit: Payer: 59

## 2022-02-21 ENCOUNTER — Ambulatory Visit
Admission: RE | Admit: 2022-02-21 | Discharge: 2022-02-21 | Disposition: A | Discharge status: Home / Self Care | Payer: 59 | Source: Ambulatory Visit | Attending: Radiation Oncology | Admitting: Radiation Oncology

## 2022-02-21 DIAGNOSIS — C61 Malignant neoplasm of prostate: Secondary | ICD-10-CM | POA: Diagnosis not present

## 2022-02-21 DIAGNOSIS — Z510 Encounter for antineoplastic radiation therapy: Secondary | ICD-10-CM | POA: Diagnosis not present

## 2022-02-21 DIAGNOSIS — Z191 Hormone sensitive malignancy status: Secondary | ICD-10-CM | POA: Diagnosis not present

## 2022-02-21 LAB — RAD ONC ARIA SESSION SUMMARY
Course Elapsed Days: 28
Plan Fractions Treated to Date: 20
Plan Prescribed Dose Per Fraction: 1.8 Gy
Plan Total Fractions Prescribed: 25
Plan Total Prescribed Dose: 45 Gy
Reference Point Dosage Given to Date: 36 Gy
Reference Point Session Dosage Given: 1.8 Gy
Session Number: 20

## 2022-02-22 ENCOUNTER — Ambulatory Visit
Admission: RE | Admit: 2022-02-22 | Discharge: 2022-02-22 | Disposition: A | Discharge status: Home / Self Care | Payer: 59 | Source: Ambulatory Visit | Attending: Radiation Oncology | Admitting: Radiation Oncology

## 2022-02-22 ENCOUNTER — Encounter: Payer: Self-pay | Admitting: Cardiology

## 2022-02-22 ENCOUNTER — Other Ambulatory Visit: Payer: Self-pay

## 2022-02-22 DIAGNOSIS — Z510 Encounter for antineoplastic radiation therapy: Secondary | ICD-10-CM | POA: Diagnosis not present

## 2022-02-22 DIAGNOSIS — Z191 Hormone sensitive malignancy status: Secondary | ICD-10-CM | POA: Diagnosis not present

## 2022-02-22 DIAGNOSIS — C61 Malignant neoplasm of prostate: Secondary | ICD-10-CM | POA: Diagnosis not present

## 2022-02-22 LAB — RAD ONC ARIA SESSION SUMMARY
Course Elapsed Days: 29
Plan Fractions Treated to Date: 21
Plan Prescribed Dose Per Fraction: 1.8 Gy
Plan Total Fractions Prescribed: 25
Plan Total Prescribed Dose: 45 Gy
Reference Point Dosage Given to Date: 37.8 Gy
Reference Point Session Dosage Given: 1.8 Gy
Session Number: 21

## 2022-02-22 NOTE — Progress Notes (Unsigned)
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  He is here for a routine visit.  From a cardiac perspective, he does not report any exertional angina, stable NYHA class I dyspnea and no specific change in stamina.  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  I reviewed his medications, he remains on Crestor, we plan to get a follow-up fasting lipid profile.  His LDL was 53 in April of last year.  I personally reviewed his ECG today which is normal.  He has been on prednisone as part of his treatment and blood pressure has increased somewhat.  He is continue to track this with no change in antihypertensive regimen as yet.   Past Medical History:  Diagnosis Date   Elevated coronary artery calcium score    Essential hypertension    History of colonic polyps    History of diverticulitis    Hyperlipidemia    Impaired fasting glucose    OSA on CPAP    PONV (postoperative nausea and vomiting)    Pre-diabetes    Prostate cancer [facility])     Past Surgical History:  Procedure Laterality Date   COLONOSCOPY  05/23/2011   Procedure: COLONOSCOPY;  Surgeon: Owens Loffler, MD;  Location: [facility] ENDOSCOPY;  Service: Endoscopy;  Laterality: N/A;   EYE SURGERY     LYMPHADENECTOMY Bilateral 05/20/2021   Procedure: LYMPHADENECTOMY, PELVIC;  Surgeon: Raynelle Bring, MD;  Location: [facility] ORS;  Service: Urology;  Laterality: Bilateral;   NASAL SEPTUM SURGERY     Prostate     ROBOT ASSISTED LAPAROSCOPIC RADICAL PROSTATECTOMY N/A 05/20/2021   Procedure: XI ROBOTIC ASSISTED LAPAROSCOPIC  RADICAL PROSTATECTOMY LEVEL 2;  Surgeon: Raynelle Bring, MD;  Location: [facility] ORS;  Service: Urology;  Laterality: N/A;   TONSILLECTOMY      Current Outpatient Medications  Medication Sig Dispense Refill   abiraterone acetate (ZYTIGA) 250 MG tablet Take 4 tablets (1,000 mg total) by mouth daily. Take on an empty stomach 1 hour before or 2 hours after a meal 120 tablet 0   amLODipine (NORVASC) 10 MG tablet Take 1 tablet (10 mg total) by mouth daily. 90 tablet 4   clobetasol cream (TEMOVATE) 0.05 % Apply topically to affected area up to 2 (two) times daily as needed. (not to face, groin, axilla) 60 g 3   docusate sodium (COLACE) 100 MG capsule Take 1 capsule (100 mg total) by mouth 2 (two) times daily.     escitalopram (LEXAPRO) 20 MG tablet Take 1 tablet (20 mg total) by mouth daily. 90 tablet 4   fluticasone (CUTIVATE) 0.05 % cream Apply topically to affected area 2 (two) times daily as needed 60 g 3   ketoconazole (NIZORAL) 2 % cream Apply topically to affected area up to 2 (two) times daily as needed. 60 g 3   prednisoLONE acetate (PRED FORTE) 1 % ophthalmic suspension Place 1 drop into the right eye 2 (two) times daily. 5 mL 0   predniSONE (DELTASONE) 5 MG tablet Take 1 tablet (5 mg total) by mouth daily with breakfast. 90 tablet 1  Risankizumab-rzaa (SKYRIZI) 150 MG/ML SOSY Inject 150 mg (1 syringe) into the skin every 12 weeks. 1 mL 3   rosuvastatin (CRESTOR) 20 MG tablet Take 1 tablet (20 mg total) by mouth daily. 90 tablet 3   sulfamethoxazole-trimethoprim (BACTRIM DS) 800-160 MG tablet Take 1 tablet by mouth 2 (two) times daily. Start the day prior to foley removal appointment 6 tablet 0   traMADol (ULTRAM) 50 MG tablet Take 1-2 tablets (50-100 mg total) by mouth every 6 (six) hours as needed for moderate pain or severe pain. 20 tablet 0   valsartan (DIOVAN) 160 MG tablet Take 1 tablet (160 mg total) by mouth daily. 90 tablet 4   No current facility-administered medications for this visit.    Allergies:  Patient has no known allergies.   ROS: No palpitations, no sudden syncope.  Physical Exam: VS:  BP (!) 144/88   Pulse 65   Ht 6' 1.5" (1.867 m)   Wt (!) 321 lb (145.6 kg)   SpO2 98%   BMI 41.78 kg/m , BMI Body mass index is 41.78 kg/m.  Wt Readings from Last 3 Encounters:  02/23/22 (!) 321 lb (145.6 kg)  01/11/22 (!) 324 lb (147 kg)  05/20/21 (!) 321 lb (145.6 kg)    General: Patient appears comfortable at rest. HEENT: Conjunctiva and lids normal. Neck: Supple, no elevated JVP or carotid bruits. Lungs: Clear to auscultation, nonlabored breathing at rest. Cardiac: Regular rate and rhythm, no S3 or significant systolic murmur, no pericardial rub. Abdomen: Bowel sounds present, no bruits.  ECG:  An ECG dated 11/05/2020 was personally reviewed today and demonstrated:  Sinus rhythm.  Recent Labwork: 05/12/2021: BUN 17; Creatinine, Ser 0.87; Platelets 295; Potassium 4.0; Sodium 136 05/21/2021: Hemoglobin 14.7     Component Value Date/Time   CHOL 156 11/16/2018 0737   TRIG 93 11/16/2018 0737   HDL 41 11/16/2018 0737   CHOLHDL 3.8 11/16/2018 0737   VLDL 19 11/16/2018 0737   LDLCALC 96 11/16/2018 0737  April 2022: BUN 14, creatinine 0.92, potassium 4.6, AST 18, ALT 18, cholesterol 118, triglycerides 149, HDL 39, LDL 53, TSH 3.82, hemoglobin 14.7, platelets 290, hemoglobin A1c 6.4%  Other Studies Reviewed Today:  Coronary calcium score 10/31/2018: FINDINGS: Non-cardiac: See separate report from [facility].   Ascending aorta: Mildly dilated 3.9 cm   Pericardium: Normal   Coronary arteries: Dense diffuse 3 vessel coronary calcium particularly in the entire LAD   IMPRESSION: Coronary calcium score of 770. This was 67 th percentile for age and sex matched control.   Lexiscan Myoview 11/26/2018: There was no ST segment deviation noted during stress. Defect 1: There is a medium defect of moderate severity present in the mid inferoseptal, mid inferior,  apical inferior and apical lateral location. This is due to soft tissue attenuation (artifact). The study is normal. No myocardial ischemia or scar. This is a low risk study. Nuclear stress EF: 58%.   Carotid Dopplers 10/02/2019: IMPRESSION: Minor carotid atherosclerosis. No hemodynamically significant ICA stenosis. Degree of narrowing less than 50% bilaterally by ultrasound criteria.   Patent antegrade vertebral flow bilaterally  Assessment and Plan:  1.  Increased coronary calcium score greater than 400, remains asymptomatic and ECG is normal today.  Ischemic testing in 2020 was low risk.  He remains on Crestor in addition to antihypertensive regimen.  We have discussed warning signs and symptoms that would prompt further cardiac evaluation.  Continue observation.  Recheck fasting lipid profile.  2.  Essential hypertension, currently on Norvasc  and Diovan.  Blood pressure control affected somewhat by interval use of steroids.  He is continuing to track this.  Also working on weight loss.  3.  OSA on CPAP with follow-up by Pulmonary.  Medication Adjustments/Labs and Tests Ordered: Current medicines are reviewed at length with the patient today.  Concerns regarding medicines are outlined above.   Tests Ordered: Orders Placed This Encounter  Procedures   Lipid panel   EKG 12-Lead    Medication Changes: No orders of the defined types were placed in this encounter.   Disposition:  Follow up  1 year, sooner if needed.  Signed, Satira Sark, MD, [facility] 02/23/2022 12:28 PM    [place] [facility] [address], [place], [place] [address] Phone: [phone]; Fax: [phone]

## 2022-02-23 ENCOUNTER — Ambulatory Visit
Admission: RE | Admit: 2022-02-23 | Discharge: 2022-02-23 | Disposition: A | Discharge status: Home / Self Care | Payer: 59 | Source: Ambulatory Visit | Attending: Radiation Oncology | Admitting: Radiation Oncology

## 2022-02-23 ENCOUNTER — Encounter: Payer: Self-pay | Admitting: Cardiology

## 2022-02-23 ENCOUNTER — Other Ambulatory Visit: Payer: Self-pay

## 2022-02-23 ENCOUNTER — Ambulatory Visit: Payer: 59 | Attending: Cardiology | Admitting: Cardiology

## 2022-02-23 VITALS — BP 144/88 | HR 65 | Ht 73.5 in | Wt 321.0 lb

## 2022-02-23 DIAGNOSIS — Z79899 Other long term (current) drug therapy: Secondary | ICD-10-CM | POA: Diagnosis not present

## 2022-02-23 DIAGNOSIS — R931 Abnormal findings on diagnostic imaging of heart and coronary circulation: Secondary | ICD-10-CM | POA: Diagnosis not present

## 2022-02-23 DIAGNOSIS — I10 Essential (primary) hypertension: Secondary | ICD-10-CM

## 2022-02-23 DIAGNOSIS — Z510 Encounter for antineoplastic radiation therapy: Secondary | ICD-10-CM | POA: Diagnosis not present

## 2022-02-23 DIAGNOSIS — C61 Malignant neoplasm of prostate: Secondary | ICD-10-CM | POA: Diagnosis not present

## 2022-02-23 DIAGNOSIS — Z191 Hormone sensitive malignancy status: Secondary | ICD-10-CM | POA: Diagnosis not present

## 2022-02-23 LAB — RAD ONC ARIA SESSION SUMMARY
Course Elapsed Days: 30
Plan Fractions Treated to Date: 22
Plan Prescribed Dose Per Fraction: 1.8 Gy
Plan Total Fractions Prescribed: 25
Plan Total Prescribed Dose: 45 Gy
Reference Point Dosage Given to Date: 39.6 Gy
Reference Point Session Dosage Given: 1.8 Gy
Session Number: 22

## 2022-02-23 NOTE — Patient Instructions (Addendum)
Medication Instructions:  Your physician recommends that you continue on your current medications as directed. Please refer to the Current Medication list given to you today.  Labwork: Your physician recommends that you return for a FASTING lipid profile as soon as possible. Please do not eat or drink for at least 8 hours when you have this done. You may take your medications that morning with a sip of water. [facility]  Testing/Procedures: none  Follow-Up: Your physician recommends that you schedule a follow-up appointment in: 1 year. You will receive a reminder call in the mail in about 10 months reminding you to call and schedule your appointment. If you don't receive this call, please contact our office.  Any Other Special Instructions Will Be Listed Below (If Applicable).  If you need a refill on your cardiac medications before your next appointment, please call your pharmacy.

## 2022-02-24 ENCOUNTER — Ambulatory Visit: Payer: 59

## 2022-02-25 ENCOUNTER — Ambulatory Visit: Payer: 59

## 2022-02-28 ENCOUNTER — Other Ambulatory Visit: Payer: Self-pay

## 2022-02-28 ENCOUNTER — Ambulatory Visit
Admission: RE | Admit: 2022-02-28 | Discharge: 2022-02-28 | Disposition: A | Discharge status: Home / Self Care | Payer: 59 | Source: Ambulatory Visit | Attending: Radiation Oncology | Admitting: Radiation Oncology

## 2022-02-28 ENCOUNTER — Ambulatory Visit: Payer: 59

## 2022-02-28 DIAGNOSIS — Z510 Encounter for antineoplastic radiation therapy: Secondary | ICD-10-CM | POA: Diagnosis not present

## 2022-02-28 DIAGNOSIS — Z191 Hormone sensitive malignancy status: Secondary | ICD-10-CM | POA: Diagnosis not present

## 2022-02-28 DIAGNOSIS — C61 Malignant neoplasm of prostate: Secondary | ICD-10-CM | POA: Diagnosis not present

## 2022-02-28 LAB — RAD ONC ARIA SESSION SUMMARY
Course Elapsed Days: 35
Plan Fractions Treated to Date: 23
Plan Prescribed Dose Per Fraction: 1.8 Gy
Plan Total Fractions Prescribed: 25
Plan Total Prescribed Dose: 45 Gy
Reference Point Dosage Given to Date: 41.4 Gy
Reference Point Session Dosage Given: 1.8 Gy
Session Number: 23

## 2022-03-01 ENCOUNTER — Ambulatory Visit
Admission: RE | Admit: 2022-03-01 | Discharge: 2022-03-01 | Disposition: A | Discharge status: Home / Self Care | Payer: 59 | Source: Ambulatory Visit | Attending: Radiation Oncology | Admitting: Radiation Oncology

## 2022-03-01 ENCOUNTER — Other Ambulatory Visit: Payer: Self-pay

## 2022-03-01 ENCOUNTER — Telehealth (INDEPENDENT_AMBULATORY_CARE_PROVIDER_SITE_OTHER): Payer: 59 | Admitting: Pulmonary Disease

## 2022-03-01 ENCOUNTER — Ambulatory Visit: Payer: 59

## 2022-03-01 ENCOUNTER — Encounter: Payer: Self-pay | Admitting: Pulmonary Disease

## 2022-03-01 VITALS — Ht 73.5 in | Wt 321.0 lb

## 2022-03-01 DIAGNOSIS — G4733 Obstructive sleep apnea (adult) (pediatric): Secondary | ICD-10-CM

## 2022-03-01 DIAGNOSIS — C61 Malignant neoplasm of prostate: Secondary | ICD-10-CM | POA: Diagnosis not present

## 2022-03-01 DIAGNOSIS — Z191 Hormone sensitive malignancy status: Secondary | ICD-10-CM | POA: Diagnosis not present

## 2022-03-01 DIAGNOSIS — Z510 Encounter for antineoplastic radiation therapy: Secondary | ICD-10-CM | POA: Diagnosis not present

## 2022-03-01 LAB — RAD ONC ARIA SESSION SUMMARY
Course Elapsed Days: 36
Plan Fractions Treated to Date: 24
Plan Prescribed Dose Per Fraction: 1.8 Gy
Plan Total Fractions Prescribed: 25
Plan Total Prescribed Dose: 45 Gy
Reference Point Dosage Given to Date: 43.2 Gy
Reference Point Session Dosage Given: 1.8 Gy
Session Number: 24

## 2022-03-01 NOTE — Progress Notes (Signed)
[facility]  Chief Complaint  Patient presents with   Follow-up    OSA    Constitutional:  Ht 6' 1.5" (1.867 m)   Wt (!) 321 lb (145.6 kg)   BMI 41.78 kg/m   Past Medical History:  HLD, Diverticulitis, Colon Polyp, HTN, coronary atherosclerosis, Prostate cancer  Past Surgical History:  He  has a past surgical history that includes Tonsillectomy; Eye surgery; Nasal septum surgery; Colonoscopy (05/23/2011); Robot assisted laparoscopic radical prostatectomy (N/A, 05/20/2021); Lymphadenectomy (Bilateral, 05/20/2021); and Prostate.  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  Location: Patient: home Provider: medical office   I discussed the limitations, risks, security and privacy concerns of performing an evaluation and management service by telephone and the availability of in person appointments. I also discussed with the patient that there may be a patient responsible charge related to this service. The patient expressed understanding and agreed to proceed.   History of Present Illness:  He is doing well with CPAP.  No issues with mask fit.  He would like to pressure to start out higher when he first puts on his mask, but does fine later in the night.   Physical Exam:   Deferred   Sleep Tests:  PSG 10/05/06 >> AHI 20.7, SpO2 low 83% HST 12/10/18 >> AHI 27.4, SpO2 low 84% Auto CPAP 01/30/22 to 02/28/22 >> used on 30 of 30 nights with average 7 hrs 55 min.  Average AHI 5.9 with median CPAP 9 and 95 th percentile CPAP 15 cm H2O  Cardiac Tests:  Echo 11/26/18 >> EF 60 to 65%, mod LVH  Social History:  He  reports that he has never smoked. He has never used smokeless  tobacco. He reports that he does not drink alcohol and does not use drugs.  Family History:  His family history includes Bladder Cancer in his father; Dementia in his mother; Heart disease in his father; Hypertension in his mother.     Time Spent Involved in Patient Care on Day of Examination:   Obstructive sleep apnea. - he is compliant with CPAP and reports benefit from therapy - he uses [facility] for his DME - current CPAP ordered August 2020 - will change his auto CPAP to 8 - 20 cm H2O - discussed how he can adjust the ramp setting for his CPAP  Time spent:   I discussed the assessment and treatment plan with the patient. The patient was provided an opportunity to ask questions and all were answered. The patient agreed with the plan and demonstrated an understanding of the instructions.   The patient was advised to call back or seek an in-person evaluation if the symptoms worsen or if the condition fails to improve as anticipated.  I provided 15 minutes of non-face-to-face time during this encounter.  Follow up:   Patient Instructions  Will have your auto CPAP setting changed to 8 - 20 cm H2O  Follow up in 1 year  Medication List:   Allergies as of 03/01/2022   No Known Allergies      Medication List        Accurate as of March 01, 2022  1:44 PM.  If you have any questions, ask your nurse or doctor.          abiraterone acetate 250 MG tablet Commonly known as: ZYTIGA Take 4 tablets (1,000 mg total) by mouth daily. Take on an empty stomach 1 hour before or 2 hours after a meal   amLODipine 10 MG tablet Commonly known as: NORVASC Take 1 tablet (10 mg total) by mouth daily.   clobetasol cream 0.05 % Commonly known as: TEMOVATE Apply topically to affected area up to 2 (two) times daily as needed. (not to face, groin, axilla)   docusate sodium 100 MG capsule Commonly known as: COLACE Take 1 capsule (100 mg total) by mouth 2 (two) times daily.    escitalopram 20 MG tablet Commonly known as: LEXAPRO Take 1 tablet (20 mg total) by mouth daily.   fluticasone 0.05 % cream Commonly known as: CUTIVATE Apply topically to affected area 2 (two) times daily as needed   ketoconazole 2 % cream Commonly known as: NIZORAL Apply topically to affected area up to 2 (two) times daily as needed.   prednisoLONE acetate 1 % ophthalmic suspension Commonly known as: PRED FORTE Place 1 drop into the right eye 2 (two) times daily.   predniSONE 5 MG tablet Commonly known as: DELTASONE Take 1 tablet (5 mg total) by mouth daily with breakfast.   rosuvastatin 20 MG tablet Commonly known as: CRESTOR Take 1 tablet (20 mg total) by mouth daily.   Skyrizi 150 MG/ML Sosy Generic drug: Risankizumab-rzaa Inject 150 mg (1 syringe) into the skin every 12 weeks.   sulfamethoxazole-trimethoprim 800-160 MG tablet Commonly known as: BACTRIM DS Take 1 tablet by mouth 2 (two) times daily. Start the day prior to foley removal appointment   traMADol 50 MG tablet Commonly known as: Ultram Take 1-2 tablets (50-100 mg total) by mouth every 6 (six) hours as needed for moderate pain or severe pain.   valsartan 160 MG tablet Commonly known as: DIOVAN Take 1 tablet (160 mg total) by mouth daily.        Signature:  Chesley Mires, MD [place] Pager - [phone] 03/01/2022, 1:44 PM

## 2022-03-01 NOTE — Patient Instructions (Signed)
Will have your auto CPAP setting changed to 8 - 20 cm H2O  Follow up in 1 year

## 2022-03-02 ENCOUNTER — Ambulatory Visit
Admission: RE | Admit: 2022-03-02 | Discharge: 2022-03-02 | Disposition: A | Discharge status: Home / Self Care | Payer: 59 | Source: Ambulatory Visit | Attending: Radiation Oncology | Admitting: Radiation Oncology

## 2022-03-02 ENCOUNTER — Other Ambulatory Visit: Payer: Self-pay

## 2022-03-02 ENCOUNTER — Other Ambulatory Visit (HOSPITAL_COMMUNITY): Payer: Self-pay

## 2022-03-02 DIAGNOSIS — Z191 Hormone sensitive malignancy status: Secondary | ICD-10-CM | POA: Diagnosis not present

## 2022-03-02 DIAGNOSIS — Z510 Encounter for antineoplastic radiation therapy: Secondary | ICD-10-CM | POA: Diagnosis not present

## 2022-03-02 DIAGNOSIS — C61 Malignant neoplasm of prostate: Secondary | ICD-10-CM | POA: Diagnosis not present

## 2022-03-02 LAB — RAD ONC ARIA SESSION SUMMARY
Course Elapsed Days: 37
Plan Fractions Treated to Date: 25
Plan Prescribed Dose Per Fraction: 1.8 Gy
Plan Total Fractions Prescribed: 25
Plan Total Prescribed Dose: 45 Gy
Reference Point Dosage Given to Date: 45 Gy
Reference Point Session Dosage Given: 1.8 Gy
Session Number: 25

## 2022-03-03 ENCOUNTER — Ambulatory Visit
Admission: RE | Admit: 2022-03-03 | Discharge: 2022-03-03 | Disposition: A | Discharge status: Home / Self Care | Payer: 59 | Source: Ambulatory Visit | Attending: Radiation Oncology | Admitting: Radiation Oncology

## 2022-03-03 ENCOUNTER — Other Ambulatory Visit: Payer: Self-pay

## 2022-03-03 ENCOUNTER — Other Ambulatory Visit (HOSPITAL_COMMUNITY): Payer: Self-pay

## 2022-03-03 ENCOUNTER — Other Ambulatory Visit: Payer: Self-pay | Admitting: Oncology

## 2022-03-03 DIAGNOSIS — C61 Malignant neoplasm of prostate: Secondary | ICD-10-CM

## 2022-03-03 DIAGNOSIS — Z510 Encounter for antineoplastic radiation therapy: Secondary | ICD-10-CM | POA: Diagnosis not present

## 2022-03-03 DIAGNOSIS — Z191 Hormone sensitive malignancy status: Secondary | ICD-10-CM | POA: Diagnosis not present

## 2022-03-03 DIAGNOSIS — G4733 Obstructive sleep apnea (adult) (pediatric): Secondary | ICD-10-CM | POA: Diagnosis not present

## 2022-03-03 LAB — RAD ONC ARIA SESSION SUMMARY
Course Elapsed Days: 38
Plan Fractions Treated to Date: 1
Plan Prescribed Dose Per Fraction: 1.8 Gy
Plan Total Fractions Prescribed: 13
Plan Total Prescribed Dose: 23.4 Gy
Reference Point Dosage Given to Date: 1.8 Gy
Reference Point Session Dosage Given: 1.8 Gy
Session Number: 26

## 2022-03-03 MED ORDER — ABIRATERONE ACETATE 250 MG PO TABS
1000.0000 mg | ORAL_TABLET | Freq: Every day | ORAL | 0 refills | Status: DC
  Filled 2022-03-03: qty 120, 30d supply, fill #0

## 2022-03-04 ENCOUNTER — Other Ambulatory Visit (HOSPITAL_COMMUNITY): Payer: Self-pay

## 2022-03-04 ENCOUNTER — Ambulatory Visit: Payer: 59

## 2022-03-04 ENCOUNTER — Ambulatory Visit
Admission: RE | Admit: 2022-03-04 | Discharge: 2022-03-04 | Disposition: A | Discharge status: Home / Self Care | Payer: 59 | Source: Ambulatory Visit | Attending: Radiation Oncology | Admitting: Radiation Oncology

## 2022-03-04 ENCOUNTER — Other Ambulatory Visit: Payer: Self-pay

## 2022-03-04 DIAGNOSIS — Z191 Hormone sensitive malignancy status: Secondary | ICD-10-CM | POA: Diagnosis not present

## 2022-03-04 DIAGNOSIS — C61 Malignant neoplasm of prostate: Secondary | ICD-10-CM | POA: Diagnosis not present

## 2022-03-04 DIAGNOSIS — Z510 Encounter for antineoplastic radiation therapy: Secondary | ICD-10-CM | POA: Diagnosis not present

## 2022-03-04 LAB — RAD ONC ARIA SESSION SUMMARY
Course Elapsed Days: 39
Plan Fractions Treated to Date: 2
Plan Prescribed Dose Per Fraction: 1.8 Gy
Plan Total Fractions Prescribed: 13
Plan Total Prescribed Dose: 23.4 Gy
Reference Point Dosage Given to Date: 3.6 Gy
Reference Point Session Dosage Given: 1.8 Gy
Session Number: 27

## 2022-03-06 ENCOUNTER — Ambulatory Visit
Admission: RE | Admit: 2022-03-06 | Discharge: 2022-03-06 | Disposition: A | Discharge status: Home / Self Care | Payer: 59 | Source: Ambulatory Visit | Attending: Radiation Oncology | Admitting: Radiation Oncology

## 2022-03-06 ENCOUNTER — Other Ambulatory Visit: Payer: Self-pay

## 2022-03-06 DIAGNOSIS — C61 Malignant neoplasm of prostate: Secondary | ICD-10-CM | POA: Diagnosis not present

## 2022-03-06 DIAGNOSIS — Z191 Hormone sensitive malignancy status: Secondary | ICD-10-CM | POA: Diagnosis not present

## 2022-03-06 DIAGNOSIS — Z510 Encounter for antineoplastic radiation therapy: Secondary | ICD-10-CM | POA: Diagnosis not present

## 2022-03-06 LAB — RAD ONC ARIA SESSION SUMMARY
Course Elapsed Days: 41
Plan Fractions Treated to Date: 3
Plan Prescribed Dose Per Fraction: 1.8 Gy
Plan Total Fractions Prescribed: 13
Plan Total Prescribed Dose: 23.4 Gy
Reference Point Dosage Given to Date: 5.4 Gy
Reference Point Session Dosage Given: 1.8 Gy
Session Number: 28

## 2022-03-07 ENCOUNTER — Ambulatory Visit
Admission: RE | Admit: 2022-03-07 | Discharge: 2022-03-07 | Disposition: A | Discharge status: Home / Self Care | Payer: 59 | Source: Ambulatory Visit | Attending: Radiation Oncology | Admitting: Radiation Oncology

## 2022-03-07 ENCOUNTER — Other Ambulatory Visit: Payer: Self-pay

## 2022-03-07 ENCOUNTER — Other Ambulatory Visit (HOSPITAL_COMMUNITY): Payer: Self-pay

## 2022-03-07 DIAGNOSIS — Z191 Hormone sensitive malignancy status: Secondary | ICD-10-CM | POA: Diagnosis not present

## 2022-03-07 DIAGNOSIS — Z510 Encounter for antineoplastic radiation therapy: Secondary | ICD-10-CM | POA: Diagnosis not present

## 2022-03-07 DIAGNOSIS — C61 Malignant neoplasm of prostate: Secondary | ICD-10-CM | POA: Diagnosis not present

## 2022-03-07 LAB — RAD ONC ARIA SESSION SUMMARY
Course Elapsed Days: 42
Plan Fractions Treated to Date: 4
Plan Prescribed Dose Per Fraction: 1.8 Gy
Plan Total Fractions Prescribed: 13
Plan Total Prescribed Dose: 23.4 Gy
Reference Point Dosage Given to Date: 7.2 Gy
Reference Point Session Dosage Given: 1.8 Gy
Session Number: 29

## 2022-03-08 ENCOUNTER — Ambulatory Visit
Admission: RE | Admit: 2022-03-08 | Discharge: 2022-03-08 | Disposition: A | Discharge status: Home / Self Care | Payer: 59 | Source: Ambulatory Visit | Attending: Radiation Oncology | Admitting: Radiation Oncology

## 2022-03-08 ENCOUNTER — Other Ambulatory Visit: Payer: Self-pay

## 2022-03-08 DIAGNOSIS — Z510 Encounter for antineoplastic radiation therapy: Secondary | ICD-10-CM | POA: Diagnosis not present

## 2022-03-08 DIAGNOSIS — Z191 Hormone sensitive malignancy status: Secondary | ICD-10-CM | POA: Diagnosis not present

## 2022-03-08 DIAGNOSIS — C61 Malignant neoplasm of prostate: Secondary | ICD-10-CM | POA: Diagnosis not present

## 2022-03-08 LAB — RAD ONC ARIA SESSION SUMMARY
Course Elapsed Days: 43
Plan Fractions Treated to Date: 5
Plan Prescribed Dose Per Fraction: 1.8 Gy
Plan Total Fractions Prescribed: 13
Plan Total Prescribed Dose: 23.4 Gy
Reference Point Dosage Given to Date: 9 Gy
Reference Point Session Dosage Given: 1.8 Gy
Session Number: 30

## 2022-03-09 ENCOUNTER — Ambulatory Visit: Payer: 59

## 2022-03-09 ENCOUNTER — Other Ambulatory Visit: Payer: Self-pay

## 2022-03-09 ENCOUNTER — Ambulatory Visit
Admission: RE | Admit: 2022-03-09 | Discharge: 2022-03-09 | Disposition: A | Discharge status: Home / Self Care | Payer: 59 | Source: Ambulatory Visit | Attending: Radiation Oncology | Admitting: Radiation Oncology

## 2022-03-09 DIAGNOSIS — C61 Malignant neoplasm of prostate: Secondary | ICD-10-CM | POA: Diagnosis not present

## 2022-03-09 DIAGNOSIS — Z510 Encounter for antineoplastic radiation therapy: Secondary | ICD-10-CM | POA: Diagnosis not present

## 2022-03-09 DIAGNOSIS — Z191 Hormone sensitive malignancy status: Secondary | ICD-10-CM | POA: Diagnosis not present

## 2022-03-09 LAB — RAD ONC ARIA SESSION SUMMARY
Course Elapsed Days: 44
Plan Fractions Treated to Date: 6
Plan Prescribed Dose Per Fraction: 1.8 Gy
Plan Total Fractions Prescribed: 13
Plan Total Prescribed Dose: 23.4 Gy
Reference Point Dosage Given to Date: 10.8 Gy
Reference Point Session Dosage Given: 1.8 Gy
Session Number: 31

## 2022-03-14 ENCOUNTER — Other Ambulatory Visit: Payer: Self-pay

## 2022-03-14 ENCOUNTER — Ambulatory Visit
Admission: RE | Admit: 2022-03-14 | Discharge: 2022-03-14 | Disposition: A | Discharge status: Home / Self Care | Payer: 59 | Source: Ambulatory Visit | Attending: Radiation Oncology | Admitting: Radiation Oncology

## 2022-03-14 DIAGNOSIS — C61 Malignant neoplasm of prostate: Secondary | ICD-10-CM | POA: Diagnosis not present

## 2022-03-14 DIAGNOSIS — Z191 Hormone sensitive malignancy status: Secondary | ICD-10-CM | POA: Diagnosis not present

## 2022-03-14 DIAGNOSIS — Z510 Encounter for antineoplastic radiation therapy: Secondary | ICD-10-CM | POA: Diagnosis not present

## 2022-03-14 LAB — RAD ONC ARIA SESSION SUMMARY
Course Elapsed Days: 49
Plan Fractions Treated to Date: 7
Plan Prescribed Dose Per Fraction: 1.8 Gy
Plan Total Fractions Prescribed: 13
Plan Total Prescribed Dose: 23.4 Gy
Reference Point Dosage Given to Date: 12.6 Gy
Reference Point Session Dosage Given: 1.8 Gy
Session Number: 32

## 2022-03-15 ENCOUNTER — Ambulatory Visit
Admission: RE | Admit: 2022-03-15 | Discharge: 2022-03-15 | Disposition: A | Discharge status: Home / Self Care | Payer: 59 | Source: Ambulatory Visit | Attending: Radiation Oncology | Admitting: Radiation Oncology

## 2022-03-15 ENCOUNTER — Inpatient Hospital Stay: Payer: 59 | Admitting: Oncology

## 2022-03-15 ENCOUNTER — Other Ambulatory Visit: Payer: Self-pay

## 2022-03-15 ENCOUNTER — Inpatient Hospital Stay: Payer: 59 | Attending: Oncology

## 2022-03-15 VITALS — BP 124/75 | HR 74 | Temp 97.8°F | Resp 18 | Ht 73.5 in | Wt 320.8 lb

## 2022-03-15 DIAGNOSIS — Z510 Encounter for antineoplastic radiation therapy: Secondary | ICD-10-CM | POA: Insufficient documentation

## 2022-03-15 DIAGNOSIS — C61 Malignant neoplasm of prostate: Secondary | ICD-10-CM | POA: Insufficient documentation

## 2022-03-15 DIAGNOSIS — Z191 Hormone sensitive malignancy status: Secondary | ICD-10-CM | POA: Diagnosis not present

## 2022-03-15 LAB — RAD ONC ARIA SESSION SUMMARY
Course Elapsed Days: 50
Plan Fractions Treated to Date: 8
Plan Prescribed Dose Per Fraction: 1.8 Gy
Plan Total Fractions Prescribed: 13
Plan Total Prescribed Dose: 23.4 Gy
Reference Point Dosage Given to Date: 14.4 Gy
Reference Point Session Dosage Given: 1.8 Gy
Session Number: 33

## 2022-03-15 LAB — CBC WITH DIFFERENTIAL (CANCER CENTER ONLY)
Abs Immature Granulocytes: 0.05 10*3/uL (ref 0.00–0.07)
Basophils Absolute: 0.1 10*3/uL (ref 0.0–0.1)
Basophils Relative: 1 %
Eosinophils Absolute: 0.2 10*3/uL (ref 0.0–0.5)
Eosinophils Relative: 3 %
HCT: 37.4 % — ABNORMAL LOW (ref 39.0–52.0)
Hemoglobin: 13.1 g/dL (ref 13.0–17.0)
Immature Granulocytes: 1 %
Lymphocytes Relative: 5 %
Lymphs Abs: 0.4 10*3/uL — ABNORMAL LOW (ref 0.7–4.0)
MCH: 30.5 pg (ref 26.0–34.0)
MCHC: 35 g/dL (ref 30.0–36.0)
MCV: 87.2 fL (ref 80.0–100.0)
Monocytes Absolute: 0.5 10*3/uL (ref 0.1–1.0)
Monocytes Relative: 8 %
Neutro Abs: 5.7 10*3/uL (ref 1.7–7.7)
Neutrophils Relative %: 82 %
Platelet Count: 223 10*3/uL (ref 150–400)
RBC: 4.29 MIL/uL (ref 4.22–5.81)
RDW: 14.7 % (ref 11.5–15.5)
WBC Count: 6.9 10*3/uL (ref 4.0–10.5)
nRBC: 0 % (ref 0.0–0.2)

## 2022-03-15 LAB — CMP (CANCER CENTER ONLY)
ALT: 17 U/L (ref 0–44)
AST: 13 U/L — ABNORMAL LOW (ref 15–41)
Albumin: 4.3 g/dL (ref 3.5–5.0)
Alkaline Phosphatase: 90 U/L (ref 38–126)
Anion gap: 7 (ref 5–15)
BUN: 21 mg/dL (ref 8–23)
CO2: 23 mmol/L (ref 22–32)
Calcium: 9.9 mg/dL (ref 8.9–10.3)
Chloride: 109 mmol/L (ref 98–111)
Creatinine: 0.84 mg/dL (ref 0.61–1.24)
GFR, Estimated: >60 mL/min (ref >60)
Glucose, Bld: 137 mg/dL — ABNORMAL HIGH (ref 70–99)
Potassium: 4 mmol/L (ref 3.5–5.1)
Sodium: 139 mmol/L (ref 135–145)
Total Bilirubin: 0.8 mg/dL (ref 0.3–1.2)
Total Protein: 7.6 g/dL (ref 6.5–8.1)

## 2022-03-15 NOTE — Progress Notes (Signed)
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 03/15/2022 1:04 PM Asencion Noble, MDFagan, [name], MD   Principle Diagnosis: 66 year old man with advanced prostate cancer diagnosis September 2023.  He was found to have castration-sensitive with disease lymph nodes as well as isolated sclerotic metastasis.  He presented with a localized disease in 2022, Gleason score 9 PSA of 3.6.    Prior Therapy: Robotic assisted laparoscopic radical prostatectomy completed on May 20, 2021 under the care of Dr. Alinda Money.  The final pathology showed T3AN0, Gleason score 9 with extracapsular extension.  0 out of 11 lymph nodes were involved with cancer.  His PSA postoperatively was 0.7 and subsequently was up to 0.6 in September 2023 with PET scan showing advanced disease.  Status post radiation therapy for oligometastatic lesions in the right acromion treated for 40 Gray in 5 fractions completed in October 2023.  Current therapy:   Eligard 30 mg every 4 months started on January 18, 2022.  Zytiga 1000 mg daily with prednisone started on January 19, 2022.  Radiation therapy to the prostate and pelvic nodes currently ongoing.  This is scheduled to be completed in December 2023.  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  Since last visit, he was started on Zytiga and prednisone without any major complications.  He has reported some mild fatigue and tiredness associated with his daily radiation. He denies any nausea, vomiting or abdominal pain.  He denies excessive fatigue, tiredness or weakness.  He denies any worsening edema.  Continues to work full-time.    Medications: Updated on review. Current Outpatient Medications  Medication Sig Dispense Refill   abiraterone acetate (ZYTIGA) 250 MG tablet Take 4 tablets (1,000 mg total) by mouth daily. Take on an empty stomach 1 hour before or 2 hours after a meal 120 tablet 0   amLODipine  (NORVASC) 10 MG tablet Take 1 tablet (10 mg total) by mouth daily. 90 tablet 4   clobetasol cream (TEMOVATE) 0.05 % Apply topically to affected area up to 2 (two) times daily as needed. (not to face, groin, axilla) 60 g 3   docusate sodium (COLACE) 100 MG capsule Take 1 capsule (100 mg total) by mouth 2 (two) times daily.     escitalopram (LEXAPRO) 20 MG tablet Take 1 tablet (20 mg total) by mouth daily. 90 tablet 4   fluticasone (CUTIVATE) 0.05 % cream Apply topically to affected area 2 (two) times daily as needed 60 g 3   ketoconazole (NIZORAL) 2 % cream Apply topically to affected area up to 2 (two) times daily as needed. 60 g 3   prednisoLONE acetate (PRED FORTE) 1 % ophthalmic suspension Place 1 drop into the right eye 2 (two) times daily. 5 mL 0   predniSONE (DELTASONE) 5 MG tablet Take 1 tablet (5 mg total) by mouth daily with breakfast. 90 tablet 1   Risankizumab-rzaa (SKYRIZI) 150 MG/ML SOSY Inject 150 mg (1 syringe) into the skin every 12 weeks. 1 mL 3   rosuvastatin (CRESTOR) 20 MG tablet Take 1 tablet (20 mg total) by mouth daily. 90 tablet 3   sulfamethoxazole-trimethoprim (BACTRIM DS) 800-160 MG tablet Take 1 tablet by mouth 2 (two) times daily. Start the day prior to foley removal appointment 6 tablet 0   traMADol (ULTRAM) 50 MG tablet Take 1-2 tablets (50-100 mg total) by mouth every 6 (six) hours as needed for moderate pain or severe pain. 20 tablet 0   valsartan (DIOVAN)  160 MG tablet Take 1 tablet (160 mg total) by mouth daily. 90 tablet 4   No current facility-administered medications for this visit.     Allergies: No Known Allergies   Physical Exam: Blood pressure 124/75, pulse 74, temperature 97.8 F (36.6 C), temperature source Temporal, resp. rate 18, height 6' 1.5" (1.867 m), weight (!) 320 lb 12.8 oz (145.5 kg), SpO2 98 %.   ECOG: 1    General appearance: Comfortable appearing without any discomfort Head: Normocephalic without any trauma Oropharynx: Mucous  membranes are moist and pink without any thrush or ulcers. Eyes: Pupils are equal and round reactive to light. Lymph nodes: No cervical, supraclavicular, inguinal or axillary lymphadenopathy.   Heart:regular rate and rhythm.  S1 and S2 without leg edema. Lung: Clear without any rhonchi or wheezes.  No dullness to percussion. Abdomin: Soft, nontender, nondistended with good bowel sounds.  No hepatosplenomegaly. Musculoskeletal: No joint deformity or effusion.  Full range of motion noted. Neurological: No deficits noted on motor, sensory and deep tendon reflex exam. Skin: No petechial rash or dryness.  Appeared moist.  Psychiatric: Mood and affect appeared appropriate.     Lab Results: Lab Results  Component Value Date   WBC 8.8 05/12/2021   HGB 14.7 05/21/2021   HCT 43.5 05/21/2021   MCV 88.9 05/12/2021   PLT 295 05/12/2021     Chemistry      Component Value Date/Time   NA 136 05/12/2021 1517   K 4.0 05/12/2021 1517   CL 106 05/12/2021 1517   CO2 22 05/12/2021 1517   BUN 17 05/12/2021 1517   CREATININE 0.87 05/12/2021 1517      Component Value Date/Time   CALCIUM 9.5 05/12/2021 1517   ALKPHOS 60 11/16/2018 0737   AST 19 11/16/2018 0737   ALT 21 11/16/2018 0737   BILITOT 0.8 11/16/2018 0737          Impression and Plan:   66 year old with   Advanced prostate cancer with nodal involvement as well as isolated bony disease to the right acromion noted in September 2023.  He initially presented with localized disease in November 2022.  He has castration-sensitive presentation.    His disease status was updated at this time and treatment choices were reviewed.  He has tolerated Zytiga and prednisone without any major complications.  Risks and benefits and duration of therapy was discussed at this time.  If he has a complete response with normalization of his PSA as well as no evidence of disease therapy de-escalation could be considered after he completes 2 years.  For  the time being he is to continue Zytiga and prednisone.  I recommended repeating PSMA PET scan in June 2024.  Complication associated with Zytiga were reiterated again.  Use include hypertension, hypokalemia, fatigue and adrenal insufficiency.  He has not had any major issues at this time.  2.  Local therapy with radiation: Currently ongoing.  No complications noted to the radiation of the prostate.  3.  Androgen deprivation therapy: Complications including weight gain, hot flashes and sexual dysfunction were reiterated.  His next injection will be in February 2024.  4.  Follow-up: In February 2024 for repeat evaluation and injection.     30  minutes were dedicated to this visit.  The time was spent on updating disease status, treatment choices and outlining future plan of care discussion.   Zola Button, MD 11/28/20231:04 PM

## 2022-03-16 ENCOUNTER — Telehealth: Payer: Self-pay | Admitting: *Deleted

## 2022-03-16 ENCOUNTER — Other Ambulatory Visit: Payer: Self-pay

## 2022-03-16 ENCOUNTER — Ambulatory Visit
Admission: RE | Admit: 2022-03-16 | Discharge: 2022-03-16 | Disposition: A | Discharge status: Home / Self Care | Payer: 59 | Source: Ambulatory Visit | Attending: Radiation Oncology | Admitting: Radiation Oncology

## 2022-03-16 ENCOUNTER — Telehealth: Payer: Self-pay | Admitting: Oncology

## 2022-03-16 DIAGNOSIS — C61 Malignant neoplasm of prostate: Secondary | ICD-10-CM | POA: Diagnosis not present

## 2022-03-16 DIAGNOSIS — Z191 Hormone sensitive malignancy status: Secondary | ICD-10-CM | POA: Diagnosis not present

## 2022-03-16 DIAGNOSIS — Z510 Encounter for antineoplastic radiation therapy: Secondary | ICD-10-CM | POA: Diagnosis not present

## 2022-03-16 LAB — RAD ONC ARIA SESSION SUMMARY
Course Elapsed Days: 51
Plan Fractions Treated to Date: 9
Plan Prescribed Dose Per Fraction: 1.8 Gy
Plan Total Fractions Prescribed: 13
Plan Total Prescribed Dose: 23.4 Gy
Reference Point Dosage Given to Date: 16.2 Gy
Reference Point Session Dosage Given: 1.8 Gy
Session Number: 34

## 2022-03-16 LAB — PROSTATE-SPECIFIC AG, SERUM (LABCORP): Prostate Specific Ag, Serum: <0.1 ng/mL (ref 0.0–4.0)

## 2022-03-16 NOTE — Telephone Encounter (Signed)
PC to patient, informed him his PSA is <0.1, he verbalizes understanding.

## 2022-03-16 NOTE — Telephone Encounter (Signed)
Per 11/28 los called pt about appointment  pt will call back to r/s if appointment does not work with his schedule

## 2022-03-16 NOTE — Telephone Encounter (Signed)
-----   Message from Jesus Portela, MD sent at 03/16/2022  1:56 PM EST ----- Please let him know his PSA is low

## 2022-03-17 ENCOUNTER — Ambulatory Visit
Admission: RE | Admit: 2022-03-17 | Discharge: 2022-03-17 | Disposition: A | Discharge status: Home / Self Care | Payer: 59 | Source: Ambulatory Visit | Attending: Radiation Oncology | Admitting: Radiation Oncology

## 2022-03-17 ENCOUNTER — Other Ambulatory Visit: Payer: Self-pay

## 2022-03-17 DIAGNOSIS — Z510 Encounter for antineoplastic radiation therapy: Secondary | ICD-10-CM | POA: Diagnosis not present

## 2022-03-17 DIAGNOSIS — C61 Malignant neoplasm of prostate: Secondary | ICD-10-CM | POA: Diagnosis not present

## 2022-03-17 DIAGNOSIS — Z191 Hormone sensitive malignancy status: Secondary | ICD-10-CM | POA: Diagnosis not present

## 2022-03-17 LAB — RAD ONC ARIA SESSION SUMMARY
Course Elapsed Days: 52
Plan Fractions Treated to Date: 10
Plan Prescribed Dose Per Fraction: 1.8 Gy
Plan Total Fractions Prescribed: 13
Plan Total Prescribed Dose: 23.4 Gy
Reference Point Dosage Given to Date: 18 Gy
Reference Point Session Dosage Given: 1.8 Gy
Session Number: 35

## 2022-03-18 ENCOUNTER — Other Ambulatory Visit: Payer: Self-pay

## 2022-03-18 ENCOUNTER — Ambulatory Visit: Payer: 59

## 2022-03-18 ENCOUNTER — Ambulatory Visit
Admission: RE | Admit: 2022-03-18 | Discharge: 2022-03-18 | Disposition: A | Discharge status: Home / Self Care | Payer: 59 | Source: Ambulatory Visit | Attending: Radiation Oncology | Admitting: Radiation Oncology

## 2022-03-18 DIAGNOSIS — C61 Malignant neoplasm of prostate: Secondary | ICD-10-CM | POA: Insufficient documentation

## 2022-03-18 DIAGNOSIS — Z510 Encounter for antineoplastic radiation therapy: Secondary | ICD-10-CM | POA: Insufficient documentation

## 2022-03-18 DIAGNOSIS — Z191 Hormone sensitive malignancy status: Secondary | ICD-10-CM | POA: Diagnosis not present

## 2022-03-18 LAB — RAD ONC ARIA SESSION SUMMARY
Course Elapsed Days: 53
Plan Fractions Treated to Date: 11
Plan Prescribed Dose Per Fraction: 1.8 Gy
Plan Total Fractions Prescribed: 13
Plan Total Prescribed Dose: 23.4 Gy
Reference Point Dosage Given to Date: 19.8 Gy
Reference Point Session Dosage Given: 1.8 Gy
Session Number: 36

## 2022-03-21 ENCOUNTER — Ambulatory Visit
Admission: RE | Admit: 2022-03-21 | Discharge: 2022-03-21 | Disposition: A | Discharge status: Home / Self Care | Payer: 59 | Source: Ambulatory Visit | Attending: Radiation Oncology | Admitting: Radiation Oncology

## 2022-03-21 ENCOUNTER — Ambulatory Visit: Payer: 59

## 2022-03-21 ENCOUNTER — Other Ambulatory Visit: Payer: Self-pay

## 2022-03-21 DIAGNOSIS — C61 Malignant neoplasm of prostate: Secondary | ICD-10-CM | POA: Diagnosis not present

## 2022-03-21 DIAGNOSIS — Z510 Encounter for antineoplastic radiation therapy: Secondary | ICD-10-CM | POA: Diagnosis not present

## 2022-03-21 DIAGNOSIS — Z191 Hormone sensitive malignancy status: Secondary | ICD-10-CM | POA: Diagnosis not present

## 2022-03-21 LAB — RAD ONC ARIA SESSION SUMMARY
Course Elapsed Days: 56
Plan Fractions Treated to Date: 12
Plan Prescribed Dose Per Fraction: 1.8 Gy
Plan Total Fractions Prescribed: 13
Plan Total Prescribed Dose: 23.4 Gy
Reference Point Dosage Given to Date: 21.6 Gy
Reference Point Session Dosage Given: 1.8 Gy
Session Number: 37

## 2022-03-22 ENCOUNTER — Encounter: Payer: Self-pay | Admitting: Urology

## 2022-03-22 ENCOUNTER — Ambulatory Visit
Admission: RE | Admit: 2022-03-22 | Discharge: 2022-03-22 | Disposition: A | Discharge status: Home / Self Care | Payer: 59 | Source: Ambulatory Visit | Attending: Radiation Oncology | Admitting: Radiation Oncology

## 2022-03-22 ENCOUNTER — Ambulatory Visit: Payer: 59

## 2022-03-22 ENCOUNTER — Other Ambulatory Visit: Payer: Self-pay

## 2022-03-22 DIAGNOSIS — Z510 Encounter for antineoplastic radiation therapy: Secondary | ICD-10-CM | POA: Diagnosis not present

## 2022-03-22 DIAGNOSIS — C61 Malignant neoplasm of prostate: Secondary | ICD-10-CM | POA: Diagnosis not present

## 2022-03-22 DIAGNOSIS — Z191 Hormone sensitive malignancy status: Secondary | ICD-10-CM | POA: Diagnosis not present

## 2022-03-22 LAB — RAD ONC ARIA SESSION SUMMARY
Course Elapsed Days: 57
Plan Fractions Treated to Date: 13
Plan Prescribed Dose Per Fraction: 1.8 Gy
Plan Total Fractions Prescribed: 13
Plan Total Prescribed Dose: 23.4 Gy
Reference Point Dosage Given to Date: 23.4 Gy
Reference Point Session Dosage Given: 1.8 Gy
Session Number: 38

## 2022-03-23 ENCOUNTER — Ambulatory Visit: Payer: 59

## 2022-03-25 ENCOUNTER — Encounter: Payer: Self-pay | Admitting: Oncology

## 2022-03-25 NOTE — Progress Notes (Signed)
                                                                                                                                                             Patient Name: Jesus Ramos MRN: [id number] DOB: [birth year] Referring Physician: [name] (Profile Not Attached) Date of Service: 03/22/2022 [facility], [place]                                                        End Of Treatment Note  Diagnoses: C61-Malignant neoplasm of prostate  Cancer Staging: 66 y.o. 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  He did report increased urinary frequency, urgency and nocturia as well as occasional diarrhea.  Plan: The patient will receive a call in about one month from the radiation oncology department. He will continue follow up with Dr. Alinda Money as well.   Nicholos Johns, PA-C    Tyler Pita, MD  [facility] Direct Dial: [phone]  Fax: [phone] [email or web address]  Skype  [email or web address]

## 2022-03-31 ENCOUNTER — Encounter: Payer: Self-pay | Admitting: Oncology

## 2022-03-31 ENCOUNTER — Other Ambulatory Visit: Payer: Self-pay

## 2022-03-31 ENCOUNTER — Other Ambulatory Visit: Payer: Self-pay | Admitting: Oncology

## 2022-03-31 ENCOUNTER — Other Ambulatory Visit (HOSPITAL_COMMUNITY): Payer: Self-pay

## 2022-03-31 DIAGNOSIS — C61 Malignant neoplasm of prostate: Secondary | ICD-10-CM

## 2022-03-31 MED ORDER — ABIRATERONE ACETATE 250 MG PO TABS
1000.0000 mg | ORAL_TABLET | Freq: Every day | ORAL | 0 refills | Status: DC
  Filled 2022-03-31: qty 120, 30d supply, fill #0

## 2022-04-01 ENCOUNTER — Other Ambulatory Visit: Payer: Self-pay

## 2022-04-02 ENCOUNTER — Encounter: Payer: Self-pay | Admitting: Oncology

## 2022-04-04 ENCOUNTER — Other Ambulatory Visit: Payer: Self-pay

## 2022-04-06 DIAGNOSIS — H53002 Unspecified amblyopia, left eye: Secondary | ICD-10-CM | POA: Insufficient documentation

## 2022-04-06 DIAGNOSIS — Z9841 Cataract extraction status, right eye: Secondary | ICD-10-CM | POA: Insufficient documentation

## 2022-04-06 DIAGNOSIS — H26491 Other secondary cataract, right eye: Secondary | ICD-10-CM | POA: Insufficient documentation

## 2022-04-06 DIAGNOSIS — Z961 Presence of intraocular lens: Secondary | ICD-10-CM | POA: Insufficient documentation

## 2022-04-06 DIAGNOSIS — H02831 Dermatochalasis of right upper eyelid: Secondary | ICD-10-CM | POA: Insufficient documentation

## 2022-04-08 DIAGNOSIS — Z9841 Cataract extraction status, right eye: Secondary | ICD-10-CM | POA: Diagnosis not present

## 2022-04-08 DIAGNOSIS — Z961 Presence of intraocular lens: Secondary | ICD-10-CM | POA: Diagnosis not present

## 2022-04-08 DIAGNOSIS — H26491 Other secondary cataract, right eye: Secondary | ICD-10-CM | POA: Diagnosis not present

## 2022-04-12 ENCOUNTER — Other Ambulatory Visit: Payer: Self-pay

## 2022-04-12 ENCOUNTER — Other Ambulatory Visit (HOSPITAL_COMMUNITY): Payer: Self-pay

## 2022-04-12 ENCOUNTER — Ambulatory Visit
Admission: RE | Admit: 2022-04-12 | Discharge: 2022-04-12 | Disposition: A | Discharge status: Home / Self Care | Payer: 59 | Source: Ambulatory Visit | Attending: Radiation Oncology | Admitting: Radiation Oncology

## 2022-04-12 NOTE — Progress Notes (Signed)
  Radiation Oncology         [phone] ________________________________  Name: [name], MD MRN: [id number]  Date of Service: 04/12/2022  DOB: [birth year]  Post Treatment Telephone Note  Diagnosis:  66 y.o. 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   Symptoms of fatigue have improved since completing therapy.  Symptoms of bladder changes have improved since completing therapy. Current symptoms include none, and medications for bladder symptoms include none.  Symptoms of bowel changes have  improved since completing therapy. Current symptoms include none, and medications for bowel symptoms include none.     Post Treatment IPSS Score: IPSS Questionnaire (AUA-7): Over the past month.   1)  How often have you had a sensation of not emptying your bladder completely after you finish urinating?  0 - Not at all  2)  How often have you had to urinate again less than two hours after you finished urinating? 0 - Not at all  3)  How often have you found you stopped and started again several times when you urinated?  0 - Not at all  4) How difficult have you found it to postpone urination?  0 - Not at all  5) How often have you had a weak urinary stream?  0 - Not at all  6) How often have you had to push or strain to begin urination?  0 - Not at all   7) How many times did you most typically get up to urinate from the time you went to bed until the time you got up in the morning?  2 - 2 times  Total score:  2. Which indicates mild symptoms  0-7 mildly symptomatic   8-19 moderately symptomatic   20-35 severely symptomatic    Patient does not currently have any scheduled follow up with his urologist to his knowledge so he was advised to call [facility] to schedule his post-treatment follow up with Dr. Alinda Money for ongoing surveillance. He was counseled that PSA levels will be drawn in the urology office, and was reassured that additional time is expected to improve bowel and bladder symptoms. He was encouraged to call back with concerns or questions regarding radiation.   This concludes the interview.   Leandra Kern, LPN

## 2022-04-13 ENCOUNTER — Other Ambulatory Visit (HOSPITAL_COMMUNITY): Payer: Self-pay

## 2022-04-14 ENCOUNTER — Other Ambulatory Visit (HOSPITAL_COMMUNITY): Payer: Self-pay

## 2022-04-14 ENCOUNTER — Other Ambulatory Visit (HOSPITAL_COMMUNITY)
Admission: RE | Admit: 2022-04-14 | Discharge: 2022-04-14 | Disposition: A | Discharge status: Home / Self Care | Payer: 59 | Source: Ambulatory Visit | Attending: Cardiology | Admitting: Cardiology

## 2022-04-14 DIAGNOSIS — Z79899 Other long term (current) drug therapy: Secondary | ICD-10-CM | POA: Diagnosis not present

## 2022-04-14 DIAGNOSIS — R931 Abnormal findings on diagnostic imaging of heart and coronary circulation: Secondary | ICD-10-CM | POA: Insufficient documentation

## 2022-04-14 LAB — LIPID PANEL
Cholesterol: 113 mg/dL (ref 0–200)
HDL: 42 mg/dL (ref >40)
LDL Cholesterol: 57 mg/dL (ref 0–99)
Total CHOL/HDL Ratio: 2.7 RATIO
Triglycerides: 68 mg/dL (ref <150)
VLDL: 14 mg/dL (ref 0–40)

## 2022-04-27 ENCOUNTER — Other Ambulatory Visit (HOSPITAL_COMMUNITY): Payer: Self-pay

## 2022-04-28 ENCOUNTER — Other Ambulatory Visit: Payer: Self-pay | Admitting: Oncology

## 2022-04-28 ENCOUNTER — Other Ambulatory Visit (HOSPITAL_COMMUNITY): Payer: Self-pay

## 2022-04-28 ENCOUNTER — Other Ambulatory Visit: Payer: Self-pay

## 2022-04-28 DIAGNOSIS — C61 Malignant neoplasm of prostate: Secondary | ICD-10-CM

## 2022-04-28 MED ORDER — ABIRATERONE ACETATE 250 MG PO TABS
1000.0000 mg | ORAL_TABLET | Freq: Every day | ORAL | 0 refills | Status: DC
  Filled 2022-04-28: qty 120, 30d supply, fill #0

## 2022-05-06 ENCOUNTER — Ambulatory Visit: Payer: Commercial Managed Care - PPO | Attending: Internal Medicine | Admitting: Pharmacist

## 2022-05-06 ENCOUNTER — Other Ambulatory Visit (HOSPITAL_COMMUNITY): Payer: Self-pay

## 2022-05-06 DIAGNOSIS — Z79899 Other long term (current) drug therapy: Secondary | ICD-10-CM

## 2022-05-06 NOTE — Progress Notes (Signed)
   S: Patient presents for review of their specialty medication therapy.  Patient is currently taking Skyrizi for psoriasis. Patient is managed by Dr. Nevada Crane for this.   Adherence: confirms  Efficacy: endorses pretty good efficacy   Dosing: Plaque psoriasis, moderate to severe: SubQ: total dose of 150 mg every 12 weeks  Screening: TB test: completed per patient  Monitoring: S/sx of infection: denies S/sx of hypersensitivity/injection site reaction: denies   O:  Lab Results  Component Value Date   WBC 6.9 03/15/2022   HGB 13.1 03/15/2022   HCT 37.4 (L) 03/15/2022   MCV 87.2 03/15/2022   PLT 223 03/15/2022     Chemistry      Component Value Date/Time   NA 139 03/15/2022 1252   K 4.0 03/15/2022 1252   CL 109 03/15/2022 1252   CO2 23 03/15/2022 1252   BUN 21 03/15/2022 1252   CREATININE 0.84 03/15/2022 1252      Component Value Date/Time   CALCIUM 9.9 03/15/2022 1252   ALKPHOS 90 03/15/2022 1252   AST 13 (L) 03/15/2022 1252   ALT 17 03/15/2022 1252   BILITOT 0.8 03/15/2022 1252      A/P: 1. Medication review: Patient currently on Carbon for psoriasis. Reviewed the medication with the patient, including the following: Orson Ape is a monoclonal antibody used in the treatment of psoriasis. Patient educated on purpose, proper use and potential adverse effects of Skyrizi. Possible adverse effects are infections, headache, and injection site reactions. Live vaccinations should be avoided while on therapy. No recommendations for any changes.   Benard Halsted, PharmD, [facility] [place], [place] [phone]

## 2022-05-09 ENCOUNTER — Other Ambulatory Visit (HOSPITAL_COMMUNITY): Payer: Self-pay

## 2022-05-11 ENCOUNTER — Other Ambulatory Visit (HOSPITAL_COMMUNITY): Payer: Self-pay

## 2022-05-13 ENCOUNTER — Other Ambulatory Visit (HOSPITAL_COMMUNITY): Payer: Self-pay

## 2022-05-13 MED ORDER — VALSARTAN 160 MG PO TABS
160.0000 mg | ORAL_TABLET | Freq: Every day | ORAL | 4 refills | Status: DC
  Filled 2022-05-13 – 2022-05-16 (×2): qty 90, 90d supply, fill #0
  Filled 2022-08-02 (×2): qty 90, 90d supply, fill #1
  Filled 2022-11-08: qty 90, 90d supply, fill #0
  Filled 2023-02-05: qty 90, 90d supply, fill #1

## 2022-05-16 ENCOUNTER — Other Ambulatory Visit (HOSPITAL_COMMUNITY): Payer: Self-pay

## 2022-05-23 ENCOUNTER — Ambulatory Visit: Payer: Self-pay | Admitting: Hematology and Oncology

## 2022-05-23 ENCOUNTER — Ambulatory Visit: Payer: Self-pay

## 2022-05-23 ENCOUNTER — Other Ambulatory Visit: Payer: Self-pay

## 2022-05-24 ENCOUNTER — Other Ambulatory Visit (HOSPITAL_COMMUNITY): Payer: Self-pay

## 2022-05-25 ENCOUNTER — Other Ambulatory Visit (HOSPITAL_COMMUNITY): Payer: Self-pay

## 2022-05-30 ENCOUNTER — Other Ambulatory Visit (HOSPITAL_COMMUNITY): Payer: Self-pay

## 2022-06-03 ENCOUNTER — Other Ambulatory Visit: Payer: Self-pay

## 2022-06-06 ENCOUNTER — Other Ambulatory Visit (HOSPITAL_COMMUNITY): Payer: Self-pay

## 2022-06-06 ENCOUNTER — Other Ambulatory Visit: Payer: Self-pay

## 2022-06-06 ENCOUNTER — Encounter: Payer: Self-pay | Admitting: Hematology and Oncology

## 2022-06-08 ENCOUNTER — Other Ambulatory Visit (HOSPITAL_COMMUNITY): Payer: Self-pay

## 2022-06-08 ENCOUNTER — Other Ambulatory Visit: Payer: Self-pay | Admitting: Oncology

## 2022-06-08 ENCOUNTER — Other Ambulatory Visit: Payer: Self-pay

## 2022-06-08 DIAGNOSIS — C61 Malignant neoplasm of prostate: Secondary | ICD-10-CM

## 2022-06-08 MED ORDER — ABIRATERONE ACETATE 250 MG PO TABS
1000.0000 mg | ORAL_TABLET | Freq: Every day | ORAL | 0 refills | Status: DC
  Filled 2022-06-08: qty 120, 30d supply, fill #0

## 2022-06-13 ENCOUNTER — Inpatient Hospital Stay: Payer: Commercial Managed Care - PPO | Attending: Oncology

## 2022-06-13 ENCOUNTER — Other Ambulatory Visit: Payer: Self-pay

## 2022-06-13 ENCOUNTER — Inpatient Hospital Stay: Payer: Commercial Managed Care - PPO

## 2022-06-13 ENCOUNTER — Inpatient Hospital Stay (HOSPITAL_BASED_OUTPATIENT_CLINIC_OR_DEPARTMENT_OTHER): Payer: Commercial Managed Care - PPO | Admitting: Hematology and Oncology

## 2022-06-13 VITALS — BP 161/70 | HR 78 | Temp 98.1°F | Resp 15 | Wt 318.9 lb

## 2022-06-13 DIAGNOSIS — Z9079 Acquired absence of other genital organ(s): Secondary | ICD-10-CM | POA: Insufficient documentation

## 2022-06-13 DIAGNOSIS — Z923 Personal history of irradiation: Secondary | ICD-10-CM | POA: Insufficient documentation

## 2022-06-13 DIAGNOSIS — I10 Essential (primary) hypertension: Secondary | ICD-10-CM | POA: Insufficient documentation

## 2022-06-13 DIAGNOSIS — C61 Malignant neoplasm of prostate: Secondary | ICD-10-CM | POA: Diagnosis not present

## 2022-06-13 DIAGNOSIS — Z79818 Long term (current) use of other agents affecting estrogen receptors and estrogen levels: Secondary | ICD-10-CM | POA: Diagnosis present

## 2022-06-13 DIAGNOSIS — D649 Anemia, unspecified: Secondary | ICD-10-CM | POA: Diagnosis not present

## 2022-06-13 DIAGNOSIS — Z79899 Other long term (current) drug therapy: Secondary | ICD-10-CM | POA: Insufficient documentation

## 2022-06-13 LAB — CMP (CANCER CENTER ONLY)
ALT: 16 U/L (ref 0–44)
AST: 12 U/L — ABNORMAL LOW (ref 15–41)
Albumin: 4.2 g/dL (ref 3.5–5.0)
Alkaline Phosphatase: 88 U/L (ref 38–126)
Anion gap: 9 (ref 5–15)
BUN: 20 mg/dL (ref 8–23)
CO2: 22 mmol/L (ref 22–32)
Calcium: 9.3 mg/dL (ref 8.9–10.3)
Chloride: 108 mmol/L (ref 98–111)
Creatinine: 0.8 mg/dL (ref 0.61–1.24)
GFR, Estimated: >60 mL/min (ref >60)
Glucose, Bld: 133 mg/dL — ABNORMAL HIGH (ref 70–99)
Potassium: 4.2 mmol/L (ref 3.5–5.1)
Sodium: 139 mmol/L (ref 135–145)
Total Bilirubin: 0.7 mg/dL (ref 0.3–1.2)
Total Protein: 7.4 g/dL (ref 6.5–8.1)

## 2022-06-13 LAB — CBC WITH DIFFERENTIAL (CANCER CENTER ONLY)
Abs Immature Granulocytes: 0.12 10*3/uL — ABNORMAL HIGH (ref 0.00–0.07)
Basophils Absolute: 0.1 10*3/uL (ref 0.0–0.1)
Basophils Relative: 1 %
Eosinophils Absolute: 0.2 10*3/uL (ref 0.0–0.5)
Eosinophils Relative: 2 %
HCT: 36.4 % — ABNORMAL LOW (ref 39.0–52.0)
Hemoglobin: 12.7 g/dL — ABNORMAL LOW (ref 13.0–17.0)
Immature Granulocytes: 2 %
Lymphocytes Relative: 7 %
Lymphs Abs: 0.5 10*3/uL — ABNORMAL LOW (ref 0.7–4.0)
MCH: 31.8 pg (ref 26.0–34.0)
MCHC: 34.9 g/dL (ref 30.0–36.0)
MCV: 91.2 fL (ref 80.0–100.0)
Monocytes Absolute: 0.5 10*3/uL (ref 0.1–1.0)
Monocytes Relative: 6 %
Neutro Abs: 6.6 10*3/uL (ref 1.7–7.7)
Neutrophils Relative %: 82 %
Platelet Count: 280 10*3/uL (ref 150–400)
RBC: 3.99 MIL/uL — ABNORMAL LOW (ref 4.22–5.81)
RDW: 13.3 % (ref 11.5–15.5)
WBC Count: 7.9 10*3/uL (ref 4.0–10.5)
nRBC: 0 % (ref 0.0–0.2)

## 2022-06-13 MED ORDER — LEUPROLIDE ACETATE (3 MONTH) 22.5 MG ~~LOC~~ KIT
22.5000 mg | PACK | Freq: Once | SUBCUTANEOUS | Status: AC
  Administered 2022-06-13: 22.5 mg via SUBCUTANEOUS
  Filled 2022-06-13: qty 22.5

## 2022-06-13 NOTE — Progress Notes (Signed)
[facility] [phone]   Fax:([phone]  PROGRESS NOTE  Patient Care Team: Asencion Noble, MD as PCP - General (Internal Medicine) Satira Sark, MD as PCP - Cardiology (Cardiology)  Hematological/Oncological History # Castrate Sensitive Advanced Prostate Cancer  05/20/2021: Robotic assisted laparoscopic radical prostatectomy, with final pathology showing T3AN0, Gleason score 9 with extracapsular extension. 0 out of 11 lymph nodes were involved with cancer.  12/2021: PSA postoperatively was 0.7  01/2022: completed radiation therapy for oligometastatic lesions in the right acromion treated for 40 Gray in 5 fractions  03/15/2022: last visit with Dr. Alen Blew 06/13/2022: transfer care to Dr. Lorenso Courier   Interval History:  Jesus Richards, MD 67 y.o. male with medical history significant for advanced prostate cancer who presents for a follow up visit. The patient's last visit was on 03/15/2022. In the interim since the last visit he has continued on eligard and zytiga.  On exam today Dr. Gala Romney reports that the Fabio Asa is causing some dry skin and hands.  He reports he is taking the medication faithfully with no missed dosages.  He does have hot flashes but he just "pushes through".  He notes he has not been having any trouble with nausea, vomiting, or diarrhea.  He is not having any pain anywhere.  He notes overall his energy is "pretty good".  He notes that he did have a mild anemia with his last CBC and rightfully suspect this is secondary to his radiation therapy.  He notes that he tolerates the Eligard shots well without any difficulty.  He reports that the shot can occasionally cause some discomfort like a "knot" under his skin.  Overall he is at his baseline level of health with no questions concerns or complaints.  A full 10 point ROS was otherwise negative.  MEDICAL HISTORY:  Past Medical History:  Diagnosis Date   Elevated coronary artery calcium score     Essential hypertension    History of colonic polyps    History of diverticulitis    Hyperlipidemia    Impaired fasting glucose    OSA on CPAP    PONV (postoperative nausea and vomiting)    Pre-diabetes    Prostate cancer [facility])     SURGICAL HISTORY: Past Surgical History:  Procedure Laterality Date   COLONOSCOPY  05/23/2011   Procedure: COLONOSCOPY;  Surgeon: Owens Loffler, MD;  Location: [facility] ENDOSCOPY;  Service: Endoscopy;  Laterality: N/A;   EYE SURGERY     LYMPHADENECTOMY Bilateral 05/20/2021   Procedure: LYMPHADENECTOMY, PELVIC;  Surgeon: Raynelle Bring, MD;  Location: [facility] ORS;  Service: Urology;  Laterality: Bilateral;   NASAL SEPTUM SURGERY     Prostate     ROBOT ASSISTED LAPAROSCOPIC RADICAL PROSTATECTOMY N/A 05/20/2021   Procedure: XI ROBOTIC ASSISTED LAPAROSCOPIC RADICAL PROSTATECTOMY LEVEL 2;  Surgeon: Raynelle Bring, MD;  Location: [facility] ORS;  Service: Urology;  Laterality: N/A;   TONSILLECTOMY      SOCIAL HISTORY: Social History   Socioeconomic History   Marital status: Married    Spouse name: Not on file   Number of children: Not on file   Years of education: Not on file   Highest education level: Not on file  Occupational History   Not on file  Tobacco Use   Smoking status: Never   Smokeless tobacco: Never  Vaping Use   Vaping Use: Never used  Substance and Sexual Activity   Alcohol use: No   Drug use: No   Sexual activity: Not on file  Other Topics Concern   Not on file  Social History Narrative   Not on file   Social Determinants of Health   Financial Resource Strain: Not on file  Food Insecurity: Not on file  Transportation Needs: Not on file  Physical Activity: Not on file  Stress: Not on file  Social Connections: Not on file  Intimate Partner Violence: Not on file    FAMILY HISTORY: Family History  Problem Relation Age of Onset   Hypertension Mother    Dementia Mother    Heart disease Father    Bladder Cancer Father     ALLERGIES:   has No Known Allergies.  MEDICATIONS:  Current Outpatient Medications  Medication Sig Dispense Refill   abiraterone acetate (ZYTIGA) 250 MG tablet Take 4 tablets (1,000 mg total) by mouth daily. Take on an empty stomach 1 hour before or 2 hours after a meal 120 tablet 0   amLODipine (NORVASC) 10 MG tablet Take 1 tablet (10 mg total) by mouth daily. 90 tablet 4   clobetasol cream (TEMOVATE) 0.05 % Apply topically to affected area up to 2 (two) times daily as needed. (not to face, groin, axilla) 60 g 3   escitalopram (LEXAPRO) 20 MG tablet Take 1 tablet (20 mg total) by mouth daily. 90 tablet 4   fluticasone (CUTIVATE) 0.05 % cream Apply topically to affected area 2 (two) times daily as needed 60 g 3   ketoconazole (NIZORAL) 2 % cream Apply topically to affected area up to 2 (two) times daily as needed. 60 g 3   prednisoLONE acetate (PRED FORTE) 1 % ophthalmic suspension Place 1 drop into the right eye 2 (two) times daily. 5 mL 0   predniSONE (DELTASONE) 5 MG tablet Take 1 tablet (5 mg total) by mouth daily with breakfast. 90 tablet 1   Risankizumab-rzaa (SKYRIZI) 150 MG/ML SOSY Inject 150 mg (1 syringe) into the skin every 12 weeks. 1 mL 3   rosuvastatin (CRESTOR) 20 MG tablet Take 1 tablet (20 mg total) by mouth daily. 90 tablet 3   valsartan (DIOVAN) 160 MG tablet Take 1 tablet (160 mg total) by mouth daily. 90 tablet 4   No current facility-administered medications for this visit.    REVIEW OF SYSTEMS:   All other systems were reviewed with the patient and are negative.  PHYSICAL EXAMINATION: ECOG PERFORMANCE STATUS: 1 - Symptomatic but completely ambulatory  Vitals:   06/13/22 1153  BP: (!) 161/70  Pulse: 78  Resp: 15  Temp: 98.1 F (36.7 C)  SpO2: 95%   Filed Weights   06/13/22 1153  Weight: (!) 318 lb 14.4 oz (144.7 kg)    GENERAL: well appearing middle aged Caucasian male, alert, no distress and comfortable SKIN: skin color, texture, turgor are normal, no rashes or  significant lesions EYES: conjunctiva are pink and non-injected, sclera clear LUNGS: clear to auscultation and percussion with normal breathing effort HEART: regular rate & rhythm and no murmurs and no lower extremity edema Musculoskeletal: no cyanosis of digits and no clubbing  PSYCH: alert & oriented x 3, fluent speech NEURO: no focal motor/sensory deficits  LABORATORY DATA:  I have reviewed the data as listed    Latest Ref Rng & Units 06/13/2022   11:24 AM 03/15/2022   12:52 PM 05/21/2021   10:44 AM  CBC  WBC 4.0 - 10.5 K/uL 7.9  6.9    Hemoglobin 13.0 - 17.0 g/dL 12.7  13.1  14.7   Hematocrit 39.0 - 52.0 % 36.4  37.4  43.5   Platelets 150 - 400 K/uL 280  223         Latest Ref Rng & Units 06/13/2022   11:24 AM 03/15/2022   12:52 PM 05/12/2021    3:17 PM  CMP  Glucose 70 - 99 mg/dL 133  137  92   BUN 8 - 23 mg/dL '20  21  17   '$ Creatinine 0.61 - 1.24 mg/dL 0.80  0.84  0.87   Sodium 135 - 145 mmol/L 139  139  136   Potassium 3.5 - 5.1 mmol/L 4.2  4.0  4.0   Chloride 98 - 111 mmol/L 108  109  106   CO2 22 - 32 mmol/L '22  23  22   '$ Calcium 8.9 - 10.3 mg/dL 9.3  9.9  9.5   Total Protein 6.5 - 8.1 g/dL 7.4  7.6    Total Bilirubin 0.3 - 1.2 mg/dL 0.7  0.8    Alkaline Phos 38 - 126 U/L 88  90    AST 15 - 41 U/L 12  13    ALT 0 - 44 U/L 16  17      RADIOGRAPHIC STUDIES: No results found.  ASSESSMENT & PLAN Jesus Richards, MD 67 y.o. male with medical history significant for advanced prostate cancer who presents for a follow up visit.  # Castrate Sensitive Advanced Prostate Cancer  --Labs today show white blood cell count 7.9, hemoglobin 12.7, MCV 91.2, and platelets of 280 --Will continue Zytiga 1000 mg p.o. daily with prednisone 5 mg p.o.  Additionally we will continue Eligard, however recommend 22.5 mg (every 3 months dosing) --currently due for imaging. Will order repeat PSMA scan  --last PSA on 03/15/2022 was at <0.1. Will recheck levels today.  -- RTC in 3 months  for continued monitoring on Zytiga.   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 Continue to monitor   Orders Placed This Encounter  Procedures   NM PET (PSMA) SKULL TO MID THIGH    Standing Status:   Future    Standing Expiration Date:   06/14/2023    Order Specific Question:   If indicated for the ordered procedure, I authorize the administration of a radiopharmaceutical per Radiology protocol    Answer:   Yes    Order Specific Question:   Preferred imaging location?    Answer:   [facility]    All questions were answered. The patient knows to call the clinic with any problems, questions or concerns.  A total of more than 40 minutes were spent on this encounter with face-to-face time and non-face-to-face time, including preparing to see the patient, ordering tests and/or medications, counseling the patient and coordination of care as outlined above.   Ledell Peoples, MD Department of Hematology/Oncology [place] [facility] Phone: [phone] Pager: [phone] Email: [email or web address]  06/13/2022 4:45 PM

## 2022-06-13 NOTE — Patient Instructions (Signed)
Leuprolide Suspension for Injection (Prostate Cancer) What is this medication? LEUPROLIDE (loo PROE lide) reduces the symptoms of prostate cancer. It works by decreasing levels of the hormone testosterone in the body. This prevents prostate cancer cells from spreading or growing. This medicine may be used for other purposes; ask your health care provider or pharmacist if you have questions. COMMON BRAND NAME(S): Eligard, Lupron Depot, Lupron Depot-Ped, Lutrate Depot, Viadur What should I tell my care team before I take this medication? They need to know if you have any of these conditions: Diabetes Heart disease Heart failure High or low levels of electrolytes, such as magnesium, potassium, or sodium in your blood Irregular heartbeat or rhythm Seizures An unusual or allergic reaction to leuprolide, other medications, foods, dyes, or preservatives Pregnant or trying to get pregnant Breast-feeding How should I use this medication? This medication is injected under the skin or into a muscle. It is given by your care team in a hospital or clinic setting. Talk to your care team about the use of this medication in children. Special care may be needed. Overdosage: If you think you have taken too much of this medicine contact a poison control center or emergency room at once. NOTE: This medicine is only for you. Do not share this medicine with others. What if I miss a dose? Keep appointments for follow-up doses. It is important not to miss your dose. Call your care team if you are unable to keep an appointment. What may interact with this medication? Do not take this medication with any of the following: Cisapride Dronedarone Ketoconazole Levoketoconazole Pimozide Thioridazine This medication may also interact with the following: Other medications that cause heart rhythm changes This list may not describe all possible interactions. Give your health care provider a list of all the medicines,  herbs, non-prescription drugs, or dietary supplements you use. Also tell them if you smoke, drink alcohol, or use illegal drugs. Some items may interact with your medicine. What should I watch for while using this medication? Visit your care team for regular checks on your progress. Tell your care team if your symptoms do not start to get better or if they get worse. This medication may increase blood sugar. The risk may be higher in patients who already have diabetes. Ask your care team what you can do to lower the risk of diabetes while taking this medication. This medication may cause infertility. Talk to your care team if you are concerned about your fertility. Heart attacks and strokes have been reported with the use of this medication. Get emergency help if you develop signs or symptoms of a heart attack or stroke. Talk to your care team about the risks and benefits of this medication. What side effects may I notice from receiving this medication? Side effects that you should report to your care team as soon as possible: Allergic reactions--skin rash, itching, hives, swelling of the face, lips, tongue, or throat Heart attack--pain or tightness in the chest, shoulders, arms, or jaw, nausea, shortness of breath, cold or clammy skin, feeling faint or lightheaded Heart rhythm changes--fast or irregular heartbeat, dizziness, feeling faint or lightheaded, chest pain, trouble breathing High blood sugar (hyperglycemia)--increased thirst or amount of urine, unusual weakness or fatigue, blurry vision Mood swings, irritability, hostility Seizures Stroke--sudden numbness or weakness of the face, arm, or leg, trouble speaking, confusion, trouble walking, loss of balance or coordination, dizziness, severe headache, change in vision Thoughts of suicide or self-harm, worsening mood, feelings of depression Side  effects that usually do not require medical attention (report to your care team if they continue or  are bothersome): Bone pain Change in sex drive or performance General discomfort and fatigue Hot flashes Muscle pain Pain, redness, or irritation at injection site Swelling of the ankles, hands, or feet This list may not describe all possible side effects. Call your doctor for medical advice about side effects. You may report side effects to FDA at 1-800-FDA-1088. Where should I keep my medication? This medication is given in a hospital or clinic. It will not be stored at home. NOTE: This sheet is a summary. It may not cover all possible information. If you have questions about this medicine, talk to your doctor, pharmacist, or health care provider.  2023 Elsevier/Gold Standard (2021-06-14 00:00:00)

## 2022-06-14 ENCOUNTER — Telehealth: Payer: Self-pay | Admitting: Hematology and Oncology

## 2022-06-14 LAB — PROSTATE-SPECIFIC AG, SERUM (LABCORP): Prostate Specific Ag, Serum: <0.1 ng/mL (ref 0.0–4.0)

## 2022-06-14 NOTE — Telephone Encounter (Signed)
Called patient per 2/26 los notes to schedule f/u. Left voicemail with new appointment information and contact details if needing to reschedule.

## 2022-06-16 ENCOUNTER — Other Ambulatory Visit (HOSPITAL_COMMUNITY): Payer: Self-pay

## 2022-06-16 ENCOUNTER — Encounter: Payer: Self-pay | Admitting: Hematology and Oncology

## 2022-06-17 ENCOUNTER — Other Ambulatory Visit (HOSPITAL_COMMUNITY): Payer: Self-pay

## 2022-06-30 ENCOUNTER — Other Ambulatory Visit (HOSPITAL_COMMUNITY): Payer: Self-pay

## 2022-07-04 ENCOUNTER — Other Ambulatory Visit (HOSPITAL_COMMUNITY): Payer: Self-pay

## 2022-07-04 ENCOUNTER — Other Ambulatory Visit: Payer: Self-pay

## 2022-07-04 ENCOUNTER — Other Ambulatory Visit: Payer: Self-pay | Admitting: Hematology and Oncology

## 2022-07-04 DIAGNOSIS — C61 Malignant neoplasm of prostate: Secondary | ICD-10-CM

## 2022-07-04 MED ORDER — ABIRATERONE ACETATE 250 MG PO TABS
1000.0000 mg | ORAL_TABLET | Freq: Every day | ORAL | 0 refills | Status: DC
  Filled 2022-07-04: qty 120, 30d supply, fill #0

## 2022-07-07 ENCOUNTER — Ambulatory Visit (HOSPITAL_COMMUNITY)
Admission: RE | Admit: 2022-07-07 | Discharge: 2022-07-07 | Disposition: A | Discharge status: Home / Self Care | Payer: Commercial Managed Care - PPO | Source: Ambulatory Visit | Attending: Hematology and Oncology | Admitting: Hematology and Oncology

## 2022-07-07 DIAGNOSIS — C61 Malignant neoplasm of prostate: Secondary | ICD-10-CM | POA: Diagnosis not present

## 2022-07-07 MED ORDER — PIFLIFOLASTAT F 18 (PYLARIFY) INJECTION
9.0000 | Freq: Once | INTRAVENOUS | Status: AC
  Administered 2022-07-07: 8.11 via INTRAVENOUS

## 2022-07-08 ENCOUNTER — Other Ambulatory Visit (HOSPITAL_COMMUNITY): Payer: Self-pay

## 2022-07-11 ENCOUNTER — Telehealth: Payer: Self-pay | Admitting: *Deleted

## 2022-07-11 NOTE — Telephone Encounter (Signed)
Notified of message below

## 2022-07-11 NOTE — Telephone Encounter (Signed)
-----   Message from Orson Slick, MD sent at 07/11/2022  1:00 PM EDT ----- Please let Mr. Gala Romney know that his PSMA scan showed continued excellent response to his prostate cancer treatment. There are no new areas of disease and old areas are improving. We will plan to see him back as scheduled in May 2024 ----- Message ----- From: Buel Ream, Rad Results In Sent: 07/11/2022  11:08 AM EDT To: Orson Slick, MD

## 2022-07-18 ENCOUNTER — Telehealth: Payer: Self-pay | Admitting: Hematology and Oncology

## 2022-07-18 NOTE — Telephone Encounter (Signed)
Patient called to reschedule may appointments due to schedule conflict. Patient rescheduled and notified.

## 2022-07-23 ENCOUNTER — Other Ambulatory Visit: Payer: Self-pay

## 2022-07-26 ENCOUNTER — Other Ambulatory Visit: Payer: Self-pay | Admitting: Hematology and Oncology

## 2022-07-26 ENCOUNTER — Other Ambulatory Visit (HOSPITAL_COMMUNITY): Payer: Self-pay

## 2022-07-26 ENCOUNTER — Other Ambulatory Visit: Payer: Self-pay

## 2022-07-26 DIAGNOSIS — C61 Malignant neoplasm of prostate: Secondary | ICD-10-CM

## 2022-07-26 MED ORDER — ABIRATERONE ACETATE 250 MG PO TABS
1000.0000 mg | ORAL_TABLET | Freq: Every day | ORAL | 0 refills | Status: DC
  Filled 2022-07-26: qty 120, 30d supply, fill #0

## 2022-07-27 ENCOUNTER — Other Ambulatory Visit (HOSPITAL_COMMUNITY): Payer: Self-pay

## 2022-07-27 DIAGNOSIS — H02403 Unspecified ptosis of bilateral eyelids: Secondary | ICD-10-CM | POA: Insufficient documentation

## 2022-07-28 ENCOUNTER — Other Ambulatory Visit (HOSPITAL_COMMUNITY): Payer: Self-pay

## 2022-07-28 ENCOUNTER — Other Ambulatory Visit: Payer: Self-pay | Admitting: *Deleted

## 2022-07-28 MED ORDER — PREDNISONE 5 MG PO TABS
5.0000 mg | ORAL_TABLET | Freq: Every day | ORAL | 3 refills | Status: DC
  Filled 2022-07-28 – 2022-10-31 (×3): qty 90, 90d supply, fill #0
  Filled 2023-02-05: qty 90, 90d supply, fill #1
  Filled 2023-05-14: qty 90, 90d supply, fill #2

## 2022-07-29 ENCOUNTER — Other Ambulatory Visit (HOSPITAL_COMMUNITY): Payer: Self-pay

## 2022-08-02 ENCOUNTER — Other Ambulatory Visit (HOSPITAL_COMMUNITY): Payer: Self-pay

## 2022-08-02 ENCOUNTER — Other Ambulatory Visit: Payer: Self-pay

## 2022-08-12 ENCOUNTER — Other Ambulatory Visit: Payer: Self-pay

## 2022-08-23 ENCOUNTER — Other Ambulatory Visit: Payer: Self-pay

## 2022-08-31 ENCOUNTER — Other Ambulatory Visit (HOSPITAL_COMMUNITY): Payer: Self-pay

## 2022-09-02 ENCOUNTER — Other Ambulatory Visit: Payer: Self-pay | Admitting: Hematology and Oncology

## 2022-09-02 ENCOUNTER — Other Ambulatory Visit (HOSPITAL_COMMUNITY): Payer: Self-pay

## 2022-09-02 ENCOUNTER — Other Ambulatory Visit: Payer: Self-pay

## 2022-09-02 ENCOUNTER — Encounter: Payer: Self-pay | Admitting: Hematology and Oncology

## 2022-09-02 DIAGNOSIS — C61 Malignant neoplasm of prostate: Secondary | ICD-10-CM

## 2022-09-02 MED ORDER — ABIRATERONE ACETATE 250 MG PO TABS
1000.0000 mg | ORAL_TABLET | Freq: Every day | ORAL | 0 refills | Status: DC
Start: 2022-09-02 — End: 2022-09-30
  Filled 2022-09-02: qty 120, 30d supply, fill #0

## 2022-09-06 ENCOUNTER — Other Ambulatory Visit (HOSPITAL_COMMUNITY): Payer: Self-pay

## 2022-09-07 ENCOUNTER — Other Ambulatory Visit (HOSPITAL_COMMUNITY): Payer: Self-pay

## 2022-09-13 ENCOUNTER — Inpatient Hospital Stay: Payer: Commercial Managed Care - PPO

## 2022-09-13 ENCOUNTER — Inpatient Hospital Stay: Payer: Commercial Managed Care - PPO | Admitting: Physician Assistant

## 2022-09-19 ENCOUNTER — Other Ambulatory Visit: Payer: Self-pay

## 2022-09-19 ENCOUNTER — Other Ambulatory Visit: Payer: Self-pay | Admitting: Hematology and Oncology

## 2022-09-19 ENCOUNTER — Inpatient Hospital Stay (HOSPITAL_BASED_OUTPATIENT_CLINIC_OR_DEPARTMENT_OTHER): Payer: Commercial Managed Care - PPO | Admitting: Hematology and Oncology

## 2022-09-19 ENCOUNTER — Inpatient Hospital Stay: Payer: Commercial Managed Care - PPO | Attending: Hematology and Oncology

## 2022-09-19 ENCOUNTER — Inpatient Hospital Stay: Payer: Commercial Managed Care - PPO

## 2022-09-19 VITALS — BP 129/74 | HR 72 | Temp 98.0°F | Resp 15 | Wt 300.3 lb

## 2022-09-19 DIAGNOSIS — C61 Malignant neoplasm of prostate: Secondary | ICD-10-CM

## 2022-09-19 DIAGNOSIS — C775 Secondary and unspecified malignant neoplasm of intrapelvic lymph nodes: Secondary | ICD-10-CM

## 2022-09-19 DIAGNOSIS — Z5111 Encounter for antineoplastic chemotherapy: Secondary | ICD-10-CM | POA: Insufficient documentation

## 2022-09-19 DIAGNOSIS — D649 Anemia, unspecified: Secondary | ICD-10-CM | POA: Insufficient documentation

## 2022-09-19 LAB — CBC WITH DIFFERENTIAL (CANCER CENTER ONLY)
Abs Immature Granulocytes: 0.02 10*3/uL (ref 0.00–0.07)
Basophils Absolute: 0.1 10*3/uL (ref 0.0–0.1)
Basophils Relative: 1 %
Eosinophils Absolute: 0.2 10*3/uL (ref 0.0–0.5)
Eosinophils Relative: 3 %
HCT: 37.9 % — ABNORMAL LOW (ref 39.0–52.0)
Hemoglobin: 13 g/dL (ref 13.0–17.0)
Immature Granulocytes: 0 %
Lymphocytes Relative: 10 %
Lymphs Abs: 0.7 10*3/uL (ref 0.7–4.0)
MCH: 30.1 pg (ref 26.0–34.0)
MCHC: 34.3 g/dL (ref 30.0–36.0)
MCV: 87.7 fL (ref 80.0–100.0)
Monocytes Absolute: 0.6 10*3/uL (ref 0.1–1.0)
Monocytes Relative: 9 %
Neutro Abs: 5.4 10*3/uL (ref 1.7–7.7)
Neutrophils Relative %: 77 %
Platelet Count: 290 10*3/uL (ref 150–400)
RBC: 4.32 MIL/uL (ref 4.22–5.81)
RDW: 13.9 % (ref 11.5–15.5)
WBC Count: 7 10*3/uL (ref 4.0–10.5)
nRBC: 0 % (ref 0.0–0.2)

## 2022-09-19 LAB — CMP (CANCER CENTER ONLY)
ALT: 13 U/L (ref 0–44)
AST: 12 U/L — ABNORMAL LOW (ref 15–41)
Albumin: 4.1 g/dL (ref 3.5–5.0)
Alkaline Phosphatase: 98 U/L (ref 38–126)
Anion gap: 9 (ref 5–15)
BUN: 17 mg/dL (ref 8–23)
CO2: 21 mmol/L — ABNORMAL LOW (ref 22–32)
Calcium: 9.9 mg/dL (ref 8.9–10.3)
Chloride: 110 mmol/L (ref 98–111)
Creatinine: 0.93 mg/dL (ref 0.61–1.24)
GFR, Estimated: >60 mL/min (ref >60)
Glucose, Bld: 98 mg/dL (ref 70–99)
Potassium: 3.7 mmol/L (ref 3.5–5.1)
Sodium: 140 mmol/L (ref 135–145)
Total Bilirubin: 0.7 mg/dL (ref 0.3–1.2)
Total Protein: 7.3 g/dL (ref 6.5–8.1)

## 2022-09-19 MED ORDER — LEUPROLIDE ACETATE (3 MONTH) 22.5 MG ~~LOC~~ KIT
22.5000 mg | PACK | Freq: Once | SUBCUTANEOUS | Status: AC
  Administered 2022-09-19: 22.5 mg via SUBCUTANEOUS
  Filled 2022-09-19: qty 22.5

## 2022-09-19 NOTE — Progress Notes (Signed)
[facility] Telephone:(336) [phone]   Fax:([phone]  PROGRESS NOTE  Patient Care Team: Carylon Perches, MD as PCP - General (Internal Medicine) Jonelle Sidle, MD as PCP - Cardiology (Cardiology)  Hematological/Oncological History # Castrate Sensitive Advanced Prostate Cancer  05/20/2021: Robotic assisted laparoscopic radical prostatectomy, with final pathology showing T3AN0, Gleason score 9 with extracapsular extension. 0 out of 11 lymph nodes were involved with cancer.  12/2021: PSA postoperatively was 0.7  01/2022: completed radiation therapy for oligometastatic lesions in the right acromion treated for 40 Gray in 5 fractions  03/15/2022: last visit with Dr. Clelia Croft 06/13/2022: transfer care to Dr. 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 The patient's last visit was on 06/13/2022. In the interim since the last visit he has continued on eligard and zytiga.  On exam today Dr. Jena Gauss reports he has been well overall in the interim since her last visit.  He started Ozempic 1 month ago and is lost 20 pounds of weight already.  He notes he started this because of his borderline A1c.  He notes that the medication gives him a "bubble" that runs from his stomach down to his intestines that makes him feel like he is full.  He notes that he is taking Zytiga faithfully with no missed dosages.  He does have some dry skin and occasional hot flashes but overall it is tolerable.  He notes that when he does sweat it is like "being hit with a panel water".  He notes he is doing his best to try to stay hydrated.  His energy levels are good and he is able to do everything he needs to do at home and work.  Overall he feels like he is tolerating it well.  He notes medication is cost about $41 per month.  Overall he is at his baseline level of health with no questions concerns or  complaints.  A full 10 point ROS was otherwise negative.  He notes he is willing and able to proceed with treatment at this time.  On a personal note Dr. Jena Gauss is a gastroenterologist with [facility] in [place].  MEDICAL HISTORY:  Past Medical History:  Diagnosis Date   Elevated coronary artery calcium score    Essential hypertension    History of colonic polyps    History of diverticulitis    Hyperlipidemia    Impaired fasting glucose    OSA on CPAP    PONV (postoperative nausea and vomiting)    Pre-diabetes    Prostate cancer [facility])     SURGICAL HISTORY: Past Surgical History:  Procedure Laterality Date   COLONOSCOPY  05/23/2011   Procedure: COLONOSCOPY;  Surgeon: Rob Bunting, MD;  [facility];  Service: Endoscopy;  Laterality: N/A;   EYE SURGERY     LYMPHADENECTOMY Bilateral 05/20/2021   Procedure: LYMPHADENECTOMY, PELVIC;  Surgeon: Heloise Purpura, MD;  Location: [facility];  Service: Urology;  Laterality: Bilateral;   NASAL SEPTUM SURGERY     Prostate     ROBOT ASSISTED LAPAROSCOPIC RADICAL PROSTATECTOMY N/A 05/20/2021   Procedure: XI ROBOTIC ASSISTED LAPAROSCOPIC RADICAL PROSTATECTOMY LEVEL 2;  Surgeon: Heloise Purpura, MD;  Location: [facility];  Service: Urology;  Laterality: N/A;   TONSILLECTOMY      SOCIAL HISTORY: Social History   Socioeconomic History   Marital status: Married    Spouse name: Not on file  Number of children: Not on file   Years of education: Not on file   Highest education level: Not on file  Occupational History   Not on file  Tobacco Use   Smoking status: Never   Smokeless tobacco: Never  Vaping Use   Vaping Use: Never used  Substance and Sexual Activity   Alcohol use: No   Drug use: No   Sexual activity: Not on file  Other Topics Concern   Not on file  Social History Narrative   Not on file   Social Determinants of Health   Financial Resource Strain: Not on file  Food Insecurity: Not on file  Transportation Needs:  Not on file  Physical Activity: Not on file  Stress: Not on file  Social Connections: Not on file  Intimate Partner Violence: Not on file    FAMILY HISTORY: Family History  Problem Relation Age of Onset   Hypertension Mother    Dementia Mother    Heart disease Father    Bladder Cancer Father     ALLERGIES:  has No Known Allergies.  MEDICATIONS:  Current Outpatient Medications  Medication Sig Dispense Refill   abiraterone acetate (ZYTIGA) 250 MG tablet Take 4 tablets (1,000 mg total) by mouth daily. Take on an empty stomach 1 hour before or 2 hours after a meal 120 tablet 0   amLODipine (NORVASC) 10 MG tablet Take 1 tablet (10 mg total) by mouth daily. 90 tablet 4   clobetasol cream (TEMOVATE) 0.05 % Apply topically to affected area up to 2 (two) times daily as needed. (not to face, groin, axilla) 60 g 3   escitalopram (LEXAPRO) 20 MG tablet Take 1 tablet (20 mg total) by mouth daily. 90 tablet 4   fluticasone (CUTIVATE) 0.05 % cream Apply topically to affected area 2 (two) times daily as needed 60 g 3   ketoconazole (NIZORAL) 2 % cream Apply topically to affected area up to 2 (two) times daily as needed. 60 g 3   prednisoLONE acetate (PRED FORTE) 1 % ophthalmic suspension Place 1 drop into the right eye 2 (two) times daily. 5 mL 0   predniSONE (DELTASONE) 5 MG tablet Take 1 tablet (5 mg total) by mouth daily with breakfast. 90 tablet 3   Risankizumab-rzaa (SKYRIZI) 150 MG/ML SOSY Inject 150 mg (1 syringe) into the skin every 12 weeks. 1 mL 3   rosuvastatin (CRESTOR) 20 MG tablet Take 1 tablet (20 mg total) by mouth daily. 90 tablet 3   valsartan (DIOVAN) 160 MG tablet Take 1 tablet (160 mg total) by mouth daily. 90 tablet 4   No current facility-administered medications for this visit.   Facility-Administered Medications Ordered in Other Visits  Medication Dose Route Frequency Provider Last Rate Last Admin   Leuprolide Acetate (3 Month) (ELIGARD) 22.5 MG injection 22.5 mg  22.5  mg Subcutaneous Once Jaci Standard, MD        REVIEW OF SYSTEMS:   All other systems were reviewed with the patient and are negative.  PHYSICAL EXAMINATION: ECOG PERFORMANCE STATUS: 1 - Symptomatic but completely ambulatory  Vitals:   09/19/22 1516  BP: 129/74  Pulse: 72  Resp: 15  Temp: 98 F (36.7 C)  SpO2: 93%    Filed Weights   09/19/22 1516  Weight: (!) 300 lb 4.8 oz (136.2 kg)     GENERAL: well appearing middle aged Caucasian male, alert, no distress and comfortable SKIN: skin color, texture, turgor are normal, no rashes or significant  lesions EYES: conjunctiva are pink and non-injected, sclera clear LUNGS: clear to auscultation and percussion with normal breathing effort HEART: regular rate & rhythm and no murmurs and no lower extremity edema Musculoskeletal: no cyanosis of digits and no clubbing  PSYCH: alert & oriented x 3, fluent speech NEURO: no focal motor/sensory deficits  LABORATORY DATA:  I have reviewed the data as listed    Latest Ref Rng & Units 09/19/2022    2:54 PM 06/13/2022   11:24 AM 03/15/2022   12:52 PM  CBC  WBC 4.0 - 10.5 K/uL 7.0  7.9  6.9   Hemoglobin 13.0 - 17.0 g/dL 78.2  95.6  21.3   Hematocrit 39.0 - 52.0 % 37.9  36.4  37.4   Platelets 150 - 400 K/uL 290  280  223        Latest Ref Rng & Units 09/19/2022    2:54 PM 06/13/2022   11:24 AM 03/15/2022   12:52 PM  CMP  Glucose 70 - 99 mg/dL 98  086  578   BUN 8 - 23 mg/dL 17  20  21    Creatinine 0.61 - 1.24 mg/dL 4.69  6.29  5.28   Sodium 135 - 145 mmol/L 140  139  139   Potassium 3.5 - 5.1 mmol/L 3.7  4.2  4.0   Chloride 98 - 111 mmol/L 110  108  109   CO2 22 - 32 mmol/L 21  22  23    Calcium 8.9 - 10.3 mg/dL 9.9  9.3  9.9   Total Protein 6.5 - 8.1 g/dL 7.3  7.4  7.6   Total Bilirubin 0.3 - 1.2 mg/dL 0.7  0.7  0.8   Alkaline Phos 38 - 126 U/L 98  88  90   AST 15 - 41 U/L 12  12  13    ALT 0 - 44 U/L 13  16  17      RADIOGRAPHIC STUDIES: No results found.  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  # Castrate Sensitive Advanced Prostate Cancer  --Labs today show white blood cell count 7.0, hemoglobin 13.0, MCV 87.7, and platelets of 290 --Will continue Zytiga 1000 mg p.o. daily with prednisone 5 mg p.o.  Additionally we will continue Eligard, however recommend 22.5 mg (every 3 months dosing) --currently due for imaging. Last PSMA scan in March 2024 showed excellent response to therapy.  --last PSA on 03/15/2022 was at <0.1. Will recheck PSA and testosterone levels today.  -- RTC in 3 months for continued monitoring on Zytiga.   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 Continue to monitor   No orders of the defined types were placed in this encounter.   All questions were answered. The patient knows to call the clinic with any problems, questions or concerns.  A total of more than 30 minutes were spent on this encounter with face-to-face time and non-face-to-face time, including preparing to see the patient, ordering tests and/or medications, counseling the patient and coordination of care as outlined above.   Ulysees Barns, MD Department of Hematology/Oncology [facility] Phone: [phone] Pager: [phone] Email: [email or web address]  09/19/2022 3:52 PM

## 2022-09-21 LAB — PROSTATE-SPECIFIC AG, SERUM (LABCORP): Prostate Specific Ag, Serum: <0.1 ng/mL (ref 0.0–4.0)

## 2022-09-21 LAB — TESTOSTERONE: Testosterone: <3 ng/dL — ABNORMAL LOW (ref 264–916)

## 2022-09-28 ENCOUNTER — Other Ambulatory Visit (HOSPITAL_COMMUNITY): Payer: Self-pay

## 2022-09-29 ENCOUNTER — Other Ambulatory Visit (HOSPITAL_COMMUNITY): Payer: Self-pay

## 2022-09-30 ENCOUNTER — Other Ambulatory Visit (HOSPITAL_COMMUNITY): Payer: Self-pay

## 2022-09-30 ENCOUNTER — Other Ambulatory Visit: Payer: Self-pay

## 2022-09-30 ENCOUNTER — Other Ambulatory Visit: Payer: Self-pay | Admitting: Hematology and Oncology

## 2022-09-30 DIAGNOSIS — C61 Malignant neoplasm of prostate: Secondary | ICD-10-CM

## 2022-09-30 MED ORDER — ABIRATERONE ACETATE 250 MG PO TABS
1000.0000 mg | ORAL_TABLET | Freq: Every day | ORAL | 0 refills | Status: DC
Start: 2022-09-30 — End: 2022-10-31
  Filled 2022-09-30 (×2): qty 120, 30d supply, fill #0

## 2022-10-07 ENCOUNTER — Other Ambulatory Visit (HOSPITAL_COMMUNITY): Payer: Self-pay

## 2022-10-10 ENCOUNTER — Other Ambulatory Visit (HOSPITAL_COMMUNITY): Payer: Self-pay

## 2022-10-12 ENCOUNTER — Other Ambulatory Visit (HOSPITAL_COMMUNITY): Payer: Self-pay

## 2022-10-24 ENCOUNTER — Telehealth: Payer: Self-pay | Admitting: Pulmonary Disease

## 2022-10-24 DIAGNOSIS — G4733 Obstructive sleep apnea (adult) (pediatric): Secondary | ICD-10-CM

## 2022-10-24 NOTE — Telephone Encounter (Signed)
Patient states needs CPAP supplies Patient would like to use [facility]. Patient phone number is [phone].

## 2022-10-26 DIAGNOSIS — H02834 Dermatochalasis of left upper eyelid: Secondary | ICD-10-CM | POA: Diagnosis not present

## 2022-10-26 DIAGNOSIS — H5022 Vertical strabismus, left eye: Secondary | ICD-10-CM | POA: Diagnosis not present

## 2022-10-26 DIAGNOSIS — H50112 Monocular exotropia, left eye: Secondary | ICD-10-CM | POA: Diagnosis not present

## 2022-10-26 DIAGNOSIS — H02831 Dermatochalasis of right upper eyelid: Secondary | ICD-10-CM | POA: Diagnosis not present

## 2022-10-26 DIAGNOSIS — H53032 Strabismic amblyopia, left eye: Secondary | ICD-10-CM | POA: Diagnosis not present

## 2022-10-26 DIAGNOSIS — H02423 Myogenic ptosis of bilateral eyelids: Secondary | ICD-10-CM | POA: Diagnosis not present

## 2022-10-26 DIAGNOSIS — H57813 Brow ptosis, bilateral: Secondary | ICD-10-CM | POA: Diagnosis not present

## 2022-10-31 ENCOUNTER — Other Ambulatory Visit: Payer: Self-pay | Admitting: Hematology and Oncology

## 2022-10-31 ENCOUNTER — Other Ambulatory Visit (HOSPITAL_COMMUNITY): Payer: Self-pay

## 2022-10-31 ENCOUNTER — Other Ambulatory Visit: Payer: Self-pay

## 2022-10-31 DIAGNOSIS — C61 Malignant neoplasm of prostate: Secondary | ICD-10-CM

## 2022-10-31 MED ORDER — ABIRATERONE ACETATE 250 MG PO TABS
1000.0000 mg | ORAL_TABLET | Freq: Every day | ORAL | 0 refills | Status: DC
Start: 2022-10-31 — End: 2022-11-22
  Filled 2022-10-31: qty 120, 30d supply, fill #0

## 2022-10-31 NOTE — Telephone Encounter (Signed)
Atc pt no answer, unable to leave vmm

## 2022-11-01 ENCOUNTER — Other Ambulatory Visit (HOSPITAL_COMMUNITY): Payer: Self-pay

## 2022-11-02 NOTE — Telephone Encounter (Signed)
Atc pt no answer, unable to leave vmm. Supplies have been ordered

## 2022-11-02 NOTE — Addendum Note (Signed)
Addended by: Glynda Jaeger on: 11/02/2022 10:39 AM   Modules accepted: Orders

## 2022-11-08 ENCOUNTER — Other Ambulatory Visit (HOSPITAL_COMMUNITY): Payer: Self-pay

## 2022-11-08 ENCOUNTER — Other Ambulatory Visit: Payer: Self-pay

## 2022-11-08 MED ORDER — AMLODIPINE BESYLATE 10 MG PO TABS
10.0000 mg | ORAL_TABLET | Freq: Every day | ORAL | 4 refills | Status: DC
  Filled 2022-11-08: qty 90, 90d supply, fill #0
  Filled 2023-02-05: qty 90, 90d supply, fill #1
  Filled 2023-05-14: qty 90, 90d supply, fill #2
  Filled 2023-09-21: qty 90, 90d supply, fill #3

## 2022-11-08 MED ORDER — ESCITALOPRAM OXALATE 20 MG PO TABS
20.0000 mg | ORAL_TABLET | Freq: Every day | ORAL | 4 refills | Status: DC
  Filled 2022-11-08: qty 90, 90d supply, fill #0
  Filled 2023-02-05: qty 90, 90d supply, fill #1
  Filled 2023-05-14: qty 90, 90d supply, fill #2
  Filled 2023-09-21: qty 90, 90d supply, fill #3

## 2022-11-09 ENCOUNTER — Other Ambulatory Visit (HOSPITAL_COMMUNITY): Payer: Self-pay

## 2022-11-11 ENCOUNTER — Other Ambulatory Visit (HOSPITAL_COMMUNITY)
Admission: RE | Admit: 2022-11-11 | Discharge: 2022-11-11 | Disposition: A | Discharge status: Home / Self Care | Payer: Commercial Managed Care - PPO | Source: Ambulatory Visit | Attending: Internal Medicine | Admitting: Internal Medicine

## 2022-11-11 DIAGNOSIS — E118 Type 2 diabetes mellitus with unspecified complications: Secondary | ICD-10-CM | POA: Insufficient documentation

## 2022-11-18 ENCOUNTER — Encounter (HOSPITAL_COMMUNITY): Payer: Self-pay

## 2022-11-18 ENCOUNTER — Other Ambulatory Visit (HOSPITAL_COMMUNITY): Payer: Self-pay

## 2022-11-18 MED ORDER — OZEMPIC (1 MG/DOSE) 4 MG/3ML ~~LOC~~ SOPN
1.0000 mg | PEN_INJECTOR | SUBCUTANEOUS | 0 refills | Status: DC
  Filled 2022-11-18 – 2022-11-22 (×2): qty 3, 28d supply, fill #0

## 2022-11-22 ENCOUNTER — Other Ambulatory Visit (HOSPITAL_COMMUNITY): Payer: Self-pay

## 2022-11-22 ENCOUNTER — Other Ambulatory Visit: Payer: Self-pay | Admitting: Hematology and Oncology

## 2022-11-22 DIAGNOSIS — C61 Malignant neoplasm of prostate: Secondary | ICD-10-CM

## 2022-11-22 MED ORDER — ABIRATERONE ACETATE 250 MG PO TABS
1000.0000 mg | ORAL_TABLET | Freq: Every day | ORAL | 0 refills | Status: DC
Start: 2022-11-22 — End: 2022-12-23
  Filled 2022-11-22: qty 120, 30d supply, fill #0

## 2022-11-23 ENCOUNTER — Other Ambulatory Visit: Payer: Self-pay

## 2022-11-23 ENCOUNTER — Other Ambulatory Visit (HOSPITAL_COMMUNITY): Payer: Self-pay

## 2022-11-25 ENCOUNTER — Other Ambulatory Visit (HOSPITAL_COMMUNITY): Payer: Self-pay

## 2022-11-28 ENCOUNTER — Other Ambulatory Visit (HOSPITAL_COMMUNITY): Payer: Self-pay

## 2022-11-28 ENCOUNTER — Telehealth: Payer: Self-pay | Admitting: Pulmonary Disease

## 2022-11-28 NOTE — Telephone Encounter (Signed)
Patient states having an issue for over a month regarding CPAP supplies needs to send [facility] Fax: [phone]. Please send current CPAP script, last HST available, and last office visit notes.   Patient frustrated and has being dealing with this for a month. Have had issues in the past getting in touch with him and verified we can leave a vm now.

## 2022-11-29 ENCOUNTER — Other Ambulatory Visit (HOSPITAL_COMMUNITY): Payer: Self-pay

## 2022-11-29 ENCOUNTER — Other Ambulatory Visit: Payer: Self-pay

## 2022-11-29 ENCOUNTER — Other Ambulatory Visit: Payer: Self-pay | Admitting: Pharmacist

## 2022-11-29 MED ORDER — RISANKIZUMAB-RZAA 150 MG/ML ~~LOC~~ SOSY
PREFILLED_SYRINGE | SUBCUTANEOUS | 0 refills | Status: DC
  Filled 2022-11-29: qty 1, fill #0
  Filled 2023-02-07: qty 1, 84d supply, fill #0

## 2022-11-29 MED ORDER — RISANKIZUMAB-RZAA 150 MG/ML ~~LOC~~ SOSY
PREFILLED_SYRINGE | SUBCUTANEOUS | 0 refills | Status: DC
Start: 2022-11-29 — End: 2022-11-29

## 2022-12-06 NOTE — Telephone Encounter (Signed)
Patient has called [facility] regarding order for CPAP supplies. [facility] stating they still need current CPAP script, last HST available, and last office visit notes.  Way overdue for CPAP supplies and patient is not sure where to go from here in order to get supplies.  Please advise. Patient states was given wrong fax. Right fax [phone]  LOV with [place] 03/01/2022

## 2022-12-06 NOTE — Telephone Encounter (Signed)
Called and spoke w/ resp from [employer]  she stated that she didn't see pt order , however she stated all of the faxes when to one main fax she is going to going reach this pt order call us back. Refaxed order to [facility]

## 2022-12-06 NOTE — Telephone Encounter (Signed)
Per telephone encounter 10/24/22 patient requested [facility], so order was sent there.   Need to confirm if he is wanting to switch to [facility]. Attempted to call mobile, recording states the call cannot be completed at this time.

## 2022-12-11 ENCOUNTER — Other Ambulatory Visit: Payer: Self-pay | Admitting: Hematology and Oncology

## 2022-12-11 DIAGNOSIS — C61 Malignant neoplasm of prostate: Secondary | ICD-10-CM

## 2022-12-11 NOTE — Progress Notes (Unsigned)
[facility] Telephone:(336) [phone]   Fax:(336) [phone]  PROGRESS NOTE  Patient Care Team: Jesus Perches, MD as PCP - General (Internal Medicine) Jonelle Sidle, MD as PCP - Cardiology (Cardiology)  Hematological/Oncological History # Castrate Sensitive Advanced Prostate Cancer  05/20/2021: Robotic assisted laparoscopic radical prostatectomy, with final pathology showing T3AN0, Gleason score 9 with extracapsular extension. 0 out of 11 lymph nodes were involved with cancer.  12/2021: PSA postoperatively was 0.7  01/2022: completed radiation therapy for oligometastatic lesions in the right acromion treated for 40 Gray in 5 fractions  03/15/2022: last visit with Dr. Clelia Croft 06/13/2022: transfer care to Dr. 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 The patient's last visit was on 09/19/2022. In the interim since the last visit he has continued on Eligard and [place].  On exam today Dr. Jena Gauss reports he has been well overall since her last visit.  He reports that his primary symptoms are fatigue and hot flashes.  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  He notes that he does feel quite "spent" at the end of a long day.  He also has noticed malodorous urine with no change in the color, urgency, burning, or bubbles.  He reports that he is currently taking Ozempic and is lost a total of 30 pounds but is eager to lose more.  He notes that suppresses his appetite greatly.  He is not recently having infectious symptoms such as runny nose, sore throat, or cough.  He does have a lot of early satiety with the Ozempic.  Overall he reports he "feels okay".  He is at his baseline level of health with no questions concerns or complaints.  A full 10 point ROS was otherwise negative.  He notes he is willing and able to proceed with  treatment at this time.  On a personal note Dr. Jena Gauss is a gastroenterologist with [facility] in [place].  MEDICAL HISTORY:  Past Medical History:  Diagnosis Date   Elevated coronary artery calcium score    Essential hypertension    History of colonic polyps    History of diverticulitis    Hyperlipidemia    Impaired fasting glucose    OSA on CPAP    PONV (postoperative nausea and vomiting)    Pre-diabetes    Prostate cancer [facility])     SURGICAL HISTORY: Past Surgical History:  Procedure Laterality Date   COLONOSCOPY  05/23/2011   Procedure: COLONOSCOPY;  Surgeon: Rob Bunting, MD;  [facility];  Service: Endoscopy;  Laterality: N/A;   EYE SURGERY     LYMPHADENECTOMY Bilateral 05/20/2021   Procedure: LYMPHADENECTOMY, PELVIC;  Surgeon: Heloise Purpura, MD;  [facility];  Service: Urology;  Laterality: Bilateral;   NASAL SEPTUM SURGERY     Prostate     ROBOT ASSISTED LAPAROSCOPIC RADICAL PROSTATECTOMY N/A 05/20/2021   Procedure: XI ROBOTIC ASSISTED LAPAROSCOPIC RADICAL PROSTATECTOMY LEVEL 2;  Surgeon: Heloise Purpura, MD;  Location: [facility];  Service: Urology;  Laterality: N/A;   TONSILLECTOMY      SOCIAL HISTORY: Social History   Socioeconomic History   Marital status: Married    Spouse name: Not on file   Number of children: Not on file   Years of education: Not on file   Highest education level: Not on file  Occupational History  Not on file  Tobacco Use   Smoking status: Never   Smokeless tobacco: Never  Vaping Use   Vaping status: Never Used  Substance and Sexual Activity   Alcohol use: No   Drug use: No   Sexual activity: Not on file  Other Topics Concern   Not on file  Social History Narrative   Not on file   Social Determinants of Health   Financial Resource Strain: Not on file  Food Insecurity: Not on file  Transportation Needs: Not on file  Physical Activity: Not on file  Stress: Not on file  Social Connections: Not on file   Intimate Partner Violence: Not on file    FAMILY HISTORY: Family History  Problem Relation Age of Onset   Hypertension Mother    Dementia Mother    Heart disease Father    Bladder Cancer Father     ALLERGIES:  has No Known Allergies.  MEDICATIONS:  Current Outpatient Medications  Medication Sig Dispense Refill   abiraterone acetate (ZYTIGA) 250 MG tablet Take 4 tablets (1,000 mg total) by mouth daily. Take on an empty stomach 1 hour before or 2 hours after a meal 120 tablet 0   amLODipine (NORVASC) 10 MG tablet Take 1 tablet (10 mg total) by mouth daily. 90 tablet 4   clobetasol cream (TEMOVATE) 0.05 % Apply topically to affected area up to 2 (two) times daily as needed. (not to face, groin, axilla) 60 g 3   escitalopram (LEXAPRO) 20 MG tablet Take 1 tablet (20 mg total) by mouth daily. 90 tablet 4   fluticasone (CUTIVATE) 0.05 % cream Apply topically to affected area 2 (two) times daily as needed 60 g 3   ketoconazole (NIZORAL) 2 % cream Apply topically to affected area up to 2 (two) times daily as needed. 60 g 3   prednisoLONE acetate (PRED FORTE) 1 % ophthalmic suspension Place 1 drop into the right eye 2 (two) times daily. 5 mL 0   predniSONE (DELTASONE) 5 MG tablet Take 1 tablet (5 mg total) by mouth daily with breakfast. 90 tablet 3   risankizumab-rzaa (SKYRIZI) 150 MG/ML SOSY prefilled syringe Inject 150mg  (1 syringe) into skin every 12 weeks. 1 mL 0   rosuvastatin (CRESTOR) 20 MG tablet Take 1 tablet (20 mg total) by mouth daily. 90 tablet 3   Semaglutide, 1 MG/DOSE, (OZEMPIC, 1 MG/DOSE,) 4 MG/3ML SOPN Inject 1 mg into the skin every 7 (seven) days. 3 mL 0   valsartan (DIOVAN) 160 MG tablet Take 1 tablet (160 mg total) by mouth daily. 90 tablet 4   No current facility-administered medications for this visit.    REVIEW OF SYSTEMS:   All other systems were reviewed with the patient and are negative.  PHYSICAL EXAMINATION: ECOG PERFORMANCE STATUS: 1 - Symptomatic but  completely ambulatory  Vitals:   12/12/22 1112  BP: 115/75  Pulse: 73  Resp: 18  Temp: 98.4 F (36.9 C)  SpO2: 98%     Filed Weights   12/12/22 1112  Weight: 294 lb 12.8 oz (133.7 kg)      GENERAL: well appearing middle aged Caucasian male, alert, no distress and comfortable SKIN: skin color, texture, turgor are normal, no rashes or significant lesions EYES: conjunctiva are pink and non-injected, sclera clear LUNGS: clear to auscultation and percussion with normal breathing effort HEART: regular rate & rhythm and no murmurs and no lower extremity edema Musculoskeletal: no cyanosis of digits and no clubbing  PSYCH: alert & oriented x  3, fluent speech NEURO: no focal motor/sensory deficits  LABORATORY DATA:  I have reviewed the data as listed    Latest Ref Rng & Units 12/12/2022   10:42 AM 09/19/2022    2:54 PM 06/13/2022   11:24 AM  CBC  WBC 4.0 - 10.5 K/uL 6.9  7.0  7.9   Hemoglobin 13.0 - 17.0 g/dL 27.2  53.6  64.4   Hematocrit 39.0 - 52.0 % 39.1  37.9  36.4   Platelets 150 - 400 K/uL 288  290  280        Latest Ref Rng & Units 12/12/2022   10:42 AM 09/19/2022    2:54 PM 06/13/2022   11:24 AM  CMP  Glucose 70 - 99 mg/dL 034  98  742   BUN 8 - 23 mg/dL 16  17  20    Creatinine 0.61 - 1.24 mg/dL 5.95  6.38  7.56   Sodium 135 - 145 mmol/L 139  140  139   Potassium 3.5 - 5.1 mmol/L 3.9  3.7  4.2   Chloride 98 - 111 mmol/L 108  110  108   CO2 22 - 32 mmol/L 23  21  22    Calcium 8.9 - 10.3 mg/dL 9.8  9.9  9.3   Total Protein 6.5 - 8.1 g/dL 7.2  7.3  7.4   Total Bilirubin 0.3 - 1.2 mg/dL 1.0  0.7  0.7   Alkaline Phos 38 - 126 U/L 101  98  88   AST 15 - 41 U/L 11  12  12    ALT 0 - 44 U/L 9  13  16      RADIOGRAPHIC STUDIES: No results found.  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  # Castrate Sensitive Advanced Prostate Cancer  --Labs today show  white blood cell count 6.9, hemoglobin 13.4, MCV 88.3, and platelets of 288 --Will continue Zytiga 1000 mg p.o. daily with prednisone 5 mg p.o.  Additionally we will continue Eligard, however recommend 22.5 mg (every 3 months dosing) --currently due for imaging. Last PSMA scan in March 2024 showed excellent response to therapy.  --last PSA on 12/12/2022 was at <0.1. Will recheck PSA and testosterone levels at each 3 month visit.   -- RTC in 3 months for continued monitoring on Zytiga.   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 Continue to monitor   No orders of the defined types were placed in this encounter.   All questions were answered. The patient knows to call the clinic with any problems, questions or concerns.  A total of more than 30 minutes were spent on this encounter with face-to-face time and non-face-to-face time, including preparing to see the patient, ordering tests and/or medications, counseling the patient and coordination of care as outlined above.   Jesus Barns, MD Department of Hematology/Oncology [facility] Phone: [phone] Pager: [phone] Email: [email or web address]  12/13/2022 9:01 AM

## 2022-12-12 ENCOUNTER — Inpatient Hospital Stay (HOSPITAL_BASED_OUTPATIENT_CLINIC_OR_DEPARTMENT_OTHER): Payer: Commercial Managed Care - PPO | Admitting: Hematology and Oncology

## 2022-12-12 ENCOUNTER — Inpatient Hospital Stay: Payer: Commercial Managed Care - PPO

## 2022-12-12 ENCOUNTER — Inpatient Hospital Stay: Payer: Commercial Managed Care - PPO | Attending: Hematology and Oncology

## 2022-12-12 VITALS — BP 115/75 | HR 73 | Temp 98.4°F | Resp 18 | Ht 73.5 in | Wt 294.8 lb

## 2022-12-12 DIAGNOSIS — C61 Malignant neoplasm of prostate: Secondary | ICD-10-CM | POA: Insufficient documentation

## 2022-12-12 DIAGNOSIS — C775 Secondary and unspecified malignant neoplasm of intrapelvic lymph nodes: Secondary | ICD-10-CM

## 2022-12-12 DIAGNOSIS — Z5111 Encounter for antineoplastic chemotherapy: Secondary | ICD-10-CM | POA: Insufficient documentation

## 2022-12-12 LAB — CBC WITH DIFFERENTIAL (CANCER CENTER ONLY)
Abs Immature Granulocytes: 0.04 10*3/uL (ref 0.00–0.07)
Basophils Absolute: 0 10*3/uL (ref 0.0–0.1)
Basophils Relative: 1 %
Eosinophils Absolute: 0.2 10*3/uL (ref 0.0–0.5)
Eosinophils Relative: 3 %
HCT: 39.1 % (ref 39.0–52.0)
Hemoglobin: 13.4 g/dL (ref 13.0–17.0)
Immature Granulocytes: 1 %
Lymphocytes Relative: 10 %
Lymphs Abs: 0.7 10*3/uL (ref 0.7–4.0)
MCH: 30.2 pg (ref 26.0–34.0)
MCHC: 34.3 g/dL (ref 30.0–36.0)
MCV: 88.3 fL (ref 80.0–100.0)
Monocytes Absolute: 0.4 10*3/uL (ref 0.1–1.0)
Monocytes Relative: 5 %
Neutro Abs: 5.6 10*3/uL (ref 1.7–7.7)
Neutrophils Relative %: 80 %
Platelet Count: 288 10*3/uL (ref 150–400)
RBC: 4.43 MIL/uL (ref 4.22–5.81)
RDW: 14 % (ref 11.5–15.5)
WBC Count: 6.9 10*3/uL (ref 4.0–10.5)
nRBC: 0 % (ref 0.0–0.2)

## 2022-12-12 LAB — CMP (CANCER CENTER ONLY)
ALT: 9 U/L (ref 0–44)
AST: 11 U/L — ABNORMAL LOW (ref 15–41)
Albumin: 4 g/dL (ref 3.5–5.0)
Alkaline Phosphatase: 101 U/L (ref 38–126)
Anion gap: 8 (ref 5–15)
BUN: 16 mg/dL (ref 8–23)
CO2: 23 mmol/L (ref 22–32)
Calcium: 9.8 mg/dL (ref 8.9–10.3)
Chloride: 108 mmol/L (ref 98–111)
Creatinine: 0.81 mg/dL (ref 0.61–1.24)
GFR, Estimated: >60 mL/min (ref >60)
Glucose, Bld: 116 mg/dL — ABNORMAL HIGH (ref 70–99)
Potassium: 3.9 mmol/L (ref 3.5–5.1)
Sodium: 139 mmol/L (ref 135–145)
Total Bilirubin: 1 mg/dL (ref 0.3–1.2)
Total Protein: 7.2 g/dL (ref 6.5–8.1)

## 2022-12-12 MED ORDER — LEUPROLIDE ACETATE (3 MONTH) 22.5 MG ~~LOC~~ KIT
22.5000 mg | PACK | Freq: Once | SUBCUTANEOUS | Status: AC
  Administered 2022-12-12: 22.5 mg via SUBCUTANEOUS
  Filled 2022-12-12: qty 22.5

## 2022-12-13 ENCOUNTER — Encounter: Payer: Self-pay | Admitting: Hematology and Oncology

## 2022-12-13 LAB — PROSTATE-SPECIFIC AG, SERUM (LABCORP): Prostate Specific Ag, Serum: <0.1 ng/mL (ref 0.0–4.0)

## 2022-12-13 LAB — TESTOSTERONE: Testosterone: <3 ng/dL — ABNORMAL LOW (ref 264–916)

## 2022-12-21 ENCOUNTER — Other Ambulatory Visit (HOSPITAL_COMMUNITY): Payer: Self-pay

## 2022-12-23 ENCOUNTER — Other Ambulatory Visit (HOSPITAL_COMMUNITY): Payer: Self-pay

## 2022-12-23 ENCOUNTER — Other Ambulatory Visit: Payer: Self-pay | Admitting: Hematology and Oncology

## 2022-12-23 DIAGNOSIS — C61 Malignant neoplasm of prostate: Secondary | ICD-10-CM

## 2022-12-23 DIAGNOSIS — G4733 Obstructive sleep apnea (adult) (pediatric): Secondary | ICD-10-CM | POA: Diagnosis not present

## 2022-12-27 ENCOUNTER — Other Ambulatory Visit: Payer: Self-pay

## 2022-12-27 ENCOUNTER — Other Ambulatory Visit (HOSPITAL_COMMUNITY): Payer: Self-pay

## 2022-12-27 DIAGNOSIS — Z9889 Other specified postprocedural states: Secondary | ICD-10-CM | POA: Insufficient documentation

## 2022-12-27 DIAGNOSIS — R7303 Prediabetes: Secondary | ICD-10-CM | POA: Insufficient documentation

## 2022-12-27 MED ORDER — ABIRATERONE ACETATE 250 MG PO TABS
1000.0000 mg | ORAL_TABLET | Freq: Every day | ORAL | 0 refills | Status: DC
  Filled 2022-12-27: qty 120, 30d supply, fill #0

## 2022-12-27 NOTE — Telephone Encounter (Signed)
Office note says continue zytiga with prednisone form 12/12/2022. Refill appropriate. Lorayne Marek, RN

## 2022-12-30 ENCOUNTER — Other Ambulatory Visit (HOSPITAL_COMMUNITY): Payer: Self-pay

## 2022-12-31 ENCOUNTER — Other Ambulatory Visit (HOSPITAL_COMMUNITY): Payer: Self-pay

## 2023-01-02 ENCOUNTER — Other Ambulatory Visit (HOSPITAL_COMMUNITY): Payer: Self-pay

## 2023-01-06 ENCOUNTER — Other Ambulatory Visit (HOSPITAL_COMMUNITY): Payer: Self-pay

## 2023-01-06 MED ORDER — OZEMPIC (2 MG/DOSE) 8 MG/3ML ~~LOC~~ SOPN
2.0000 mg | PEN_INJECTOR | SUBCUTANEOUS | 5 refills | Status: DC
  Filled 2023-01-06: qty 3, 28d supply, fill #0
  Filled 2023-02-06: qty 3, 28d supply, fill #1
  Filled 2023-03-18: qty 3, 28d supply, fill #2
  Filled 2023-04-30: qty 3, 28d supply, fill #3
  Filled 2023-06-01 – 2023-06-03 (×2): qty 3, 28d supply, fill #4
  Filled 2023-07-02: qty 3, 28d supply, fill #5

## 2023-01-07 ENCOUNTER — Encounter (HOSPITAL_COMMUNITY): Payer: Self-pay

## 2023-01-07 ENCOUNTER — Other Ambulatory Visit: Payer: Self-pay

## 2023-01-08 ENCOUNTER — Other Ambulatory Visit (HOSPITAL_COMMUNITY): Payer: Self-pay

## 2023-01-10 ENCOUNTER — Other Ambulatory Visit: Payer: Self-pay

## 2023-01-10 ENCOUNTER — Other Ambulatory Visit (HOSPITAL_COMMUNITY): Payer: Self-pay

## 2023-01-12 ENCOUNTER — Other Ambulatory Visit (HOSPITAL_COMMUNITY): Payer: Self-pay

## 2023-01-22 DIAGNOSIS — G4733 Obstructive sleep apnea (adult) (pediatric): Secondary | ICD-10-CM | POA: Diagnosis not present

## 2023-02-01 ENCOUNTER — Other Ambulatory Visit: Payer: Self-pay

## 2023-02-05 ENCOUNTER — Other Ambulatory Visit: Payer: Self-pay | Admitting: Cardiology

## 2023-02-05 ENCOUNTER — Other Ambulatory Visit (HOSPITAL_COMMUNITY): Payer: Self-pay

## 2023-02-06 ENCOUNTER — Other Ambulatory Visit (HOSPITAL_COMMUNITY): Payer: Self-pay

## 2023-02-06 ENCOUNTER — Other Ambulatory Visit: Payer: Self-pay

## 2023-02-06 MED ORDER — ROSUVASTATIN CALCIUM 20 MG PO TABS
20.0000 mg | ORAL_TABLET | Freq: Every day | ORAL | 3 refills | Status: DC
  Filled 2023-02-06 – 2023-05-14 (×2): qty 90, 90d supply, fill #0
  Filled 2023-09-21: qty 90, 90d supply, fill #1
  Filled 2024-02-01: qty 90, 90d supply, fill #2

## 2023-02-07 ENCOUNTER — Encounter (HOSPITAL_COMMUNITY): Payer: Self-pay

## 2023-02-07 ENCOUNTER — Other Ambulatory Visit (HOSPITAL_COMMUNITY): Payer: Self-pay

## 2023-02-07 ENCOUNTER — Other Ambulatory Visit: Payer: Self-pay | Admitting: Hematology and Oncology

## 2023-02-07 ENCOUNTER — Other Ambulatory Visit: Payer: Self-pay

## 2023-02-07 DIAGNOSIS — C61 Malignant neoplasm of prostate: Secondary | ICD-10-CM

## 2023-02-07 MED ORDER — ABIRATERONE ACETATE 250 MG PO TABS
1000.0000 mg | ORAL_TABLET | Freq: Every day | ORAL | 0 refills | Status: DC
Start: 2023-02-07 — End: 2023-03-06
  Filled 2023-02-07: qty 120, 30d supply, fill #0

## 2023-02-07 NOTE — Progress Notes (Signed)
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 *Pending Refill Request*

## 2023-02-07 NOTE — Progress Notes (Signed)
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   Therapeutic benefit summary: Patient is achieving benefit   Adverse events/side effects summary: No adverse events/side effects   Patient's therapy is appropriate to: Continue    Goals Addressed             This Visit's Progress    Slow Disease Progression       Patient is on track. Patient will maintain adherence. Dr. Luvenia Ramos PSA remains <0.1 ng/mL, last lab from 12/12/22.         Follow up:  6 months  Servando Snare Specialty Pharmacist

## 2023-02-07 NOTE — Progress Notes (Signed)
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

## 2023-02-09 ENCOUNTER — Other Ambulatory Visit: Payer: Self-pay

## 2023-02-09 ENCOUNTER — Telehealth: Payer: Self-pay | Admitting: Pharmacy Technician

## 2023-02-09 ENCOUNTER — Other Ambulatory Visit (HOSPITAL_COMMUNITY): Payer: Self-pay

## 2023-02-09 NOTE — Telephone Encounter (Signed)
Oral Oncology Patient Advocate Encounter   Received notification that prior authorization for abiraterone is due for renewal.   PA submitted on 02/09/23 Key [id number] Status is pending     Jesus Ramos, CPhT-Adv Oncology Pharmacy Patient Advocate [facility] Direct Number: [phone]  Fax: [phone]

## 2023-02-10 ENCOUNTER — Other Ambulatory Visit (HOSPITAL_COMMUNITY): Payer: Self-pay

## 2023-02-13 ENCOUNTER — Other Ambulatory Visit: Payer: Self-pay

## 2023-02-13 ENCOUNTER — Other Ambulatory Visit (HOSPITAL_COMMUNITY): Payer: Self-pay

## 2023-02-13 NOTE — Telephone Encounter (Signed)
Oral Oncology Patient Advocate Encounter  Prior Authorization for abiraterone has been approved.    PA# [id number] Effective dates: 02/10/23 through 02/09/24  Patient may continue to fill at [facility].    Jinger Neighbors, CPhT-Adv Oncology Pharmacy Patient Advocate [facility] Direct Number: [phone]  Fax: [phone]

## 2023-02-22 ENCOUNTER — Other Ambulatory Visit (HOSPITAL_COMMUNITY): Payer: Self-pay

## 2023-02-22 DIAGNOSIS — G4733 Obstructive sleep apnea (adult) (pediatric): Secondary | ICD-10-CM | POA: Diagnosis not present

## 2023-02-28 ENCOUNTER — Other Ambulatory Visit: Payer: Self-pay

## 2023-03-03 ENCOUNTER — Other Ambulatory Visit: Payer: Self-pay

## 2023-03-06 ENCOUNTER — Other Ambulatory Visit: Payer: Self-pay

## 2023-03-06 ENCOUNTER — Other Ambulatory Visit (HOSPITAL_COMMUNITY): Payer: Self-pay

## 2023-03-06 ENCOUNTER — Other Ambulatory Visit: Payer: Self-pay | Admitting: Hematology and Oncology

## 2023-03-06 DIAGNOSIS — E118 Type 2 diabetes mellitus with unspecified complications: Secondary | ICD-10-CM | POA: Diagnosis not present

## 2023-03-06 DIAGNOSIS — C61 Malignant neoplasm of prostate: Secondary | ICD-10-CM

## 2023-03-06 MED ORDER — ABIRATERONE ACETATE 250 MG PO TABS
1000.0000 mg | ORAL_TABLET | Freq: Every day | ORAL | 0 refills | Status: DC
Start: 2023-03-06 — End: 2023-04-04
  Filled 2023-03-06: qty 120, 30d supply, fill #0

## 2023-03-06 NOTE — Progress Notes (Signed)
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  Refill request pending. Please notify if delayed.

## 2023-03-08 ENCOUNTER — Other Ambulatory Visit: Payer: Self-pay

## 2023-03-12 ENCOUNTER — Other Ambulatory Visit: Payer: Self-pay | Admitting: Hematology and Oncology

## 2023-03-12 DIAGNOSIS — C61 Malignant neoplasm of prostate: Secondary | ICD-10-CM

## 2023-03-12 NOTE — Progress Notes (Unsigned)
[facility] [phone]   Fax:([phone]  PROGRESS NOTE  Patient Care Team: Carylon Perches, MD as PCP - General (Internal Medicine) Jonelle Sidle, MD as PCP - Cardiology (Cardiology)  Hematological/Oncological History # Castrate Sensitive Advanced Prostate Cancer  05/20/2021: Robotic assisted laparoscopic radical prostatectomy, with final pathology showing T3AN0, Gleason score 9 with extracapsular extension. 0 out of 11 lymph nodes were involved with cancer.  12/2021: PSA postoperatively was 0.7  01/2022: completed radiation therapy for oligometastatic lesions in the right acromion treated for 40 Gray in 5 fractions  03/15/2022: last visit with Dr. Clelia Croft 06/13/2022: transfer care to Dr. 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 male with medical history significant for advanced prostate cancer who presents for a follow up visit. The patient's last visit was on 12/12/2022. In the interim since the last visit he has continued on Eligard and [place].  On exam today Dr. Jena Gauss reports he has been well overall and interim since her last visit.  He continues taking his Ozempic and his weight is down to 286 pounds.  He reports his goal is to get down to 230 pounds.  He reports his hemoglobin A1c was last at 5.8%.  He notes that his weight loss has been slow and steady.  He notes he is faithfully taking his Zytiga and prednisone with no missed dosages.  He does have some occasional hot flashes which appear to be mitigated by the recent cold weather.  He reports that he is able to function well at home with no difficulty other than needing some occasional breaks.  He reports he continues to work a full schedule and has no other questions concerns or complaints today.  A full 10 point ROS was otherwise negative.  He notes he is willing and able to proceed with treatment at this time.  On a personal note Dr. Jena Gauss is a gastroenterologist with [facility] in  [place].  MEDICAL HISTORY:  Past Medical History:  Diagnosis Date   Elevated coronary artery calcium score    Essential hypertension    History of colonic polyps    History of diverticulitis    Hyperlipidemia    Impaired fasting glucose    OSA on CPAP    PONV (postoperative nausea and vomiting)    Pre-diabetes    Prostate cancer [facility])     SURGICAL HISTORY: Past Surgical History:  Procedure Laterality Date   COLONOSCOPY  05/23/2011   Procedure: COLONOSCOPY;  Surgeon: Rob Bunting, MD;  Location: [facility];  Service: Endoscopy;  Laterality: N/A;   EYE SURGERY     LYMPHADENECTOMY Bilateral 05/20/2021   Procedure: LYMPHADENECTOMY, PELVIC;  Surgeon: Heloise Purpura, MD;  Location: [facility];  Service: Urology;  Laterality: Bilateral;   NASAL SEPTUM SURGERY     Prostate     ROBOT ASSISTED LAPAROSCOPIC RADICAL PROSTATECTOMY N/A 05/20/2021   Procedure: XI ROBOTIC ASSISTED LAPAROSCOPIC RADICAL PROSTATECTOMY LEVEL 2;  Surgeon: Heloise Purpura, MD;  Location: [facility];  Service: Urology;  Laterality: N/A;   TONSILLECTOMY      SOCIAL HISTORY: Social History   Socioeconomic History   Marital status: Married    Spouse name: Not on file   Number of children: Not on file   Years of education: Not on file   Highest education level: Not on file  Occupational History   Not on file  Tobacco Use   Smoking status: Never   Smokeless tobacco: Never  Vaping Use  Vaping status: Never Used  Substance and Sexual Activity   Alcohol use: No   Drug use: No   Sexual activity: Not on file  Other Topics Concern   Not on file  Social History Narrative   Not on file   Social Determinants of Health   Financial Resource Strain: Not on file  Food Insecurity: Not on file  Transportation Needs: Not on file  Physical Activity: Not on file  Stress: Not on file  Social Connections: Not on file  Intimate Partner Violence: Not on file    FAMILY HISTORY: Family History  Problem Relation Age  of Onset   Hypertension Mother    Dementia Mother    Heart disease Father    Bladder Cancer Father     ALLERGIES:  has No Known Allergies.  MEDICATIONS:  Current Outpatient Medications  Medication Sig Dispense Refill   abiraterone acetate (ZYTIGA) 250 MG tablet Take 4 tablets (1,000 mg total) by mouth daily. Take on an empty stomach 1 hour before or 2 hours after a meal 120 tablet 0   amLODipine (NORVASC) 10 MG tablet Take 1 tablet (10 mg total) by mouth daily. 90 tablet 4   aspirin EC 81 MG tablet Take 81 mg by mouth daily.     clobetasol cream (TEMOVATE) 0.05 % Apply topically to affected area up to 2 (two) times daily as needed. (not to face, groin, axilla) 60 g 3   escitalopram (LEXAPRO) 20 MG tablet Take 1 tablet (20 mg total) by mouth daily. 90 tablet 4   fluticasone (CUTIVATE) 0.05 % cream Apply topically to affected area 2 (two) times daily as needed 60 g 3   ibuprofen (ADVIL) 800 MG tablet Take 800 mg by mouth.     ketoconazole (NIZORAL) 2 % cream Apply topically to affected area up to 2 (two) times daily as needed. 60 g 3   levofloxacin (LEVAQUIN) 750 MG tablet Take 750 mg by mouth.     prednisoLONE acetate (PRED FORTE) 1 % ophthalmic suspension Place 1 drop into the right eye 2 (two) times daily. 5 mL 0   predniSONE (DELTASONE) 5 MG tablet Take 1 tablet (5 mg total) by mouth daily with breakfast. 90 tablet 3   risankizumab-rzaa (SKYRIZI) 150 MG/ML SOSY prefilled syringe Inject 150mg  (1 syringe) into skin every 12 weeks. 1 mL 0   rosuvastatin (CRESTOR) 20 MG tablet Take 1 tablet (20 mg total) by mouth daily. 90 tablet 3   Semaglutide, 1 MG/DOSE, (OZEMPIC, 1 MG/DOSE,) 4 MG/3ML SOPN Inject 1 mg into the skin every 7 (seven) days. 3 mL 0   Semaglutide, 2 MG/DOSE, (OZEMPIC, 2 MG/DOSE,) 8 MG/3ML SOPN Inject 2 mg into the skin once a week. 3 mL 5   valsartan (DIOVAN) 160 MG tablet Take 1 tablet (160 mg total) by mouth daily. 90 tablet 4   No current facility-administered medications  for this visit.    REVIEW OF SYSTEMS:   All other systems were reviewed with the patient and are negative.  PHYSICAL EXAMINATION: ECOG PERFORMANCE STATUS: 1 - Symptomatic but completely ambulatory  Vitals:   03/13/23 1153  BP: (!) 151/85  Pulse: 72  Resp: 14  Temp: 97.7 F (36.5 C)  SpO2: 98%      Filed Weights   03/13/23 1153  Weight: 286 lb 11.2 oz (130 kg)       GENERAL: well appearing middle aged Caucasian male, alert, no distress and comfortable SKIN: skin color, texture, turgor are normal, no rashes  or significant lesions EYES: conjunctiva are pink and non-injected, sclera clear LUNGS: clear to auscultation and percussion with normal breathing effort HEART: regular rate & rhythm and no murmurs and no lower extremity edema Musculoskeletal: no cyanosis of digits and no clubbing  PSYCH: alert & oriented x 3, fluent speech NEURO: no focal motor/sensory deficits  LABORATORY DATA:  I have reviewed the data as listed    Latest Ref Rng & Units 03/13/2023   11:04 AM 12/12/2022   10:42 AM 09/19/2022    2:54 PM  CBC  WBC 4.0 - 10.5 K/uL 6.3  6.9  7.0   Hemoglobin 13.0 - 17.0 g/dL 98.1  19.1  47.8   Hematocrit 39.0 - 52.0 % 37.5  39.1  37.9   Platelets 150 - 400 K/uL 287  288  290        Latest Ref Rng & Units 03/13/2023   11:04 AM 12/12/2022   10:42 AM 09/19/2022    2:54 PM  CMP  Glucose 70 - 99 mg/dL 88  295  98   BUN 8 - 23 mg/dL 15  16  17    Creatinine 0.61 - 1.24 mg/dL 6.21  3.08  6.57   Sodium 135 - 145 mmol/L 139  139  140   Potassium 3.5 - 5.1 mmol/L 3.8  3.9  3.7   Chloride 98 - 111 mmol/L 109  108  110   CO2 22 - 32 mmol/L 23  23  21    Calcium 8.9 - 10.3 mg/dL 9.8  9.8  9.9   Total Protein 6.5 - 8.1 g/dL 7.3  7.2  7.3   Total Bilirubin <1.2 mg/dL 1.0  1.0  0.7   Alkaline Phos 38 - 126 U/L 96  101  98   AST 15 - 41 U/L 11  11  12    ALT 0 - 44 U/L 9  9  13      RADIOGRAPHIC STUDIES: No results found.  ASSESSMENT & PLAN Gennette Pac, MD  67 y.o. male with medical history significant for advanced prostate cancer who presents for a follow up visit.  # Castrate Sensitive Advanced Prostate Cancer  --Labs today show white blood cell count 6.3, Hgb 13.0, MCV 87.2, Plt 287 --Will continue Zytiga 1000 mg p.o. daily with prednisone 5 mg p.o.  Additionally we will continue Eligard, however recommend 22.5 mg (every 3 months dosing) --currently due for imaging. Last PSMA scan in March 2024 showed excellent response to therapy.  --last PSA on 12/12/2022 was at <0.1. Will recheck PSA and testosterone levels at each 3 month visit.   -- RTC in 3 months for continued monitoring on Zytiga and lupron shots   # Anemia-resolved -- mild and likely 2/2 to radiation therapy. Continue to monitor   No orders of the defined types were placed in this encounter.   All questions were answered. The patient knows to call the clinic with any problems, questions or concerns.  A total of more than 30 minutes were spent on this encounter with face-to-face time and non-face-to-face time, including preparing to see the patient, ordering tests and/or medications, counseling the patient and coordination of care as outlined above.   Ulysees Barns, MD Department of Hematology/Oncology [facility] Phone: [phone] Pager: [phone] Email: [email or web address]  03/13/2023 4:52 PM

## 2023-03-13 ENCOUNTER — Inpatient Hospital Stay: Payer: Commercial Managed Care - PPO | Admitting: Hematology and Oncology

## 2023-03-13 ENCOUNTER — Inpatient Hospital Stay: Payer: Commercial Managed Care - PPO | Attending: Hematology and Oncology

## 2023-03-13 ENCOUNTER — Inpatient Hospital Stay: Payer: Commercial Managed Care - PPO

## 2023-03-13 VITALS — BP 151/85 | HR 72 | Temp 97.7°F | Resp 14 | Wt 286.7 lb

## 2023-03-13 DIAGNOSIS — Z5111 Encounter for antineoplastic chemotherapy: Secondary | ICD-10-CM | POA: Diagnosis not present

## 2023-03-13 DIAGNOSIS — C61 Malignant neoplasm of prostate: Secondary | ICD-10-CM | POA: Insufficient documentation

## 2023-03-13 DIAGNOSIS — Z7952 Long term (current) use of systemic steroids: Secondary | ICD-10-CM | POA: Diagnosis not present

## 2023-03-13 DIAGNOSIS — Z923 Personal history of irradiation: Secondary | ICD-10-CM | POA: Insufficient documentation

## 2023-03-13 LAB — CBC WITH DIFFERENTIAL (CANCER CENTER ONLY)
Abs Immature Granulocytes: 0.03 10*3/uL (ref 0.00–0.07)
Basophils Absolute: 0.1 10*3/uL (ref 0.0–0.1)
Basophils Relative: 1 %
Eosinophils Absolute: 0.3 10*3/uL (ref 0.0–0.5)
Eosinophils Relative: 4 %
HCT: 37.5 % — ABNORMAL LOW (ref 39.0–52.0)
Hemoglobin: 13 g/dL (ref 13.0–17.0)
Immature Granulocytes: 1 %
Lymphocytes Relative: 13 %
Lymphs Abs: 0.8 10*3/uL (ref 0.7–4.0)
MCH: 30.2 pg (ref 26.0–34.0)
MCHC: 34.7 g/dL (ref 30.0–36.0)
MCV: 87.2 fL (ref 80.0–100.0)
Monocytes Absolute: 0.5 10*3/uL (ref 0.1–1.0)
Monocytes Relative: 9 %
Neutro Abs: 4.6 10*3/uL (ref 1.7–7.7)
Neutrophils Relative %: 72 %
Platelet Count: 287 10*3/uL (ref 150–400)
RBC: 4.3 MIL/uL (ref 4.22–5.81)
RDW: 13.9 % (ref 11.5–15.5)
WBC Count: 6.3 10*3/uL (ref 4.0–10.5)
nRBC: 0 % (ref 0.0–0.2)

## 2023-03-13 LAB — CMP (CANCER CENTER ONLY)
ALT: 9 U/L (ref 0–44)
AST: 11 U/L — ABNORMAL LOW (ref 15–41)
Albumin: 4 g/dL (ref 3.5–5.0)
Alkaline Phosphatase: 96 U/L (ref 38–126)
Anion gap: 7 (ref 5–15)
BUN: 15 mg/dL (ref 8–23)
CO2: 23 mmol/L (ref 22–32)
Calcium: 9.8 mg/dL (ref 8.9–10.3)
Chloride: 109 mmol/L (ref 98–111)
Creatinine: 0.9 mg/dL (ref 0.61–1.24)
GFR, Estimated: >60 mL/min (ref >60)
Glucose, Bld: 88 mg/dL (ref 70–99)
Potassium: 3.8 mmol/L (ref 3.5–5.1)
Sodium: 139 mmol/L (ref 135–145)
Total Bilirubin: 1 mg/dL (ref <1.2)
Total Protein: 7.3 g/dL (ref 6.5–8.1)

## 2023-03-13 MED ORDER — LEUPROLIDE ACETATE (3 MONTH) 22.5 MG ~~LOC~~ KIT
22.5000 mg | PACK | Freq: Once | SUBCUTANEOUS | Status: AC
  Administered 2023-03-13: 22.5 mg via SUBCUTANEOUS
  Filled 2023-03-13: qty 22.5

## 2023-03-14 LAB — PROSTATE-SPECIFIC AG, SERUM (LABCORP): Prostate Specific Ag, Serum: <0.1 ng/mL (ref 0.0–4.0)

## 2023-03-14 LAB — TESTOSTERONE: Testosterone: <3 ng/dL — ABNORMAL LOW (ref 264–916)

## 2023-03-18 ENCOUNTER — Other Ambulatory Visit (HOSPITAL_COMMUNITY): Payer: Self-pay

## 2023-03-28 ENCOUNTER — Other Ambulatory Visit (HOSPITAL_COMMUNITY): Payer: Self-pay

## 2023-04-03 ENCOUNTER — Other Ambulatory Visit: Payer: Self-pay

## 2023-04-04 ENCOUNTER — Other Ambulatory Visit (HOSPITAL_COMMUNITY): Payer: Self-pay

## 2023-04-04 ENCOUNTER — Other Ambulatory Visit (HOSPITAL_COMMUNITY): Payer: Self-pay | Admitting: Pharmacy Technician

## 2023-04-04 ENCOUNTER — Other Ambulatory Visit: Payer: Self-pay | Admitting: Hematology and Oncology

## 2023-04-04 ENCOUNTER — Other Ambulatory Visit: Payer: Self-pay

## 2023-04-04 DIAGNOSIS — C61 Malignant neoplasm of prostate: Secondary | ICD-10-CM

## 2023-04-04 MED ORDER — ABIRATERONE ACETATE 250 MG PO TABS
1000.0000 mg | ORAL_TABLET | Freq: Every day | ORAL | 0 refills | Status: DC
Start: 2023-04-04 — End: 2023-04-27
  Filled 2023-04-04: qty 120, 30d supply, fill #0

## 2023-04-04 NOTE — Progress Notes (Signed)
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

## 2023-04-05 ENCOUNTER — Other Ambulatory Visit: Payer: Self-pay

## 2023-04-24 ENCOUNTER — Other Ambulatory Visit (HOSPITAL_COMMUNITY): Payer: Self-pay

## 2023-04-24 DIAGNOSIS — H518 Other specified disorders of binocular movement: Secondary | ICD-10-CM | POA: Diagnosis not present

## 2023-04-24 DIAGNOSIS — H50112 Monocular exotropia, left eye: Secondary | ICD-10-CM | POA: Diagnosis not present

## 2023-04-24 DIAGNOSIS — H532 Diplopia: Secondary | ICD-10-CM | POA: Diagnosis not present

## 2023-04-24 DIAGNOSIS — H5034 Intermittent alternating exotropia: Secondary | ICD-10-CM | POA: Diagnosis not present

## 2023-04-24 DIAGNOSIS — H53032 Strabismic amblyopia, left eye: Secondary | ICD-10-CM | POA: Diagnosis not present

## 2023-04-24 DIAGNOSIS — H5022 Vertical strabismus, left eye: Secondary | ICD-10-CM | POA: Diagnosis not present

## 2023-04-25 ENCOUNTER — Other Ambulatory Visit: Payer: Self-pay

## 2023-04-27 ENCOUNTER — Other Ambulatory Visit (HOSPITAL_COMMUNITY): Payer: Self-pay

## 2023-04-27 ENCOUNTER — Other Ambulatory Visit: Payer: Self-pay | Admitting: Hematology and Oncology

## 2023-04-27 ENCOUNTER — Other Ambulatory Visit: Payer: Self-pay

## 2023-04-27 DIAGNOSIS — C61 Malignant neoplasm of prostate: Secondary | ICD-10-CM

## 2023-04-27 MED ORDER — ABIRATERONE ACETATE 250 MG PO TABS
1000.0000 mg | ORAL_TABLET | Freq: Every day | ORAL | 0 refills | Status: AC
Start: 2023-04-27 — End: ?
  Filled 2023-04-27: qty 120, 30d supply, fill #0

## 2023-04-27 MED ORDER — ABIRATERONE ACETATE 250 MG PO TABS
1000.0000 mg | ORAL_TABLET | Freq: Every day | ORAL | 0 refills | Status: DC
  Filled 2023-04-27 – 2023-06-01 (×2): qty 120, 30d supply, fill #0

## 2023-04-27 NOTE — Progress Notes (Signed)
 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

## 2023-04-27 NOTE — Progress Notes (Signed)
 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   Therapeutic benefit summary: Patient is achieving benefit   Adverse events/side effects summary: No adverse events/side effects   Patient's therapy is appropriate to: Continue    Goals Addressed             This Visit's Progress    Minimize recurrence of flares       Patient is on track. Patient will maintain adherence.  Patient reports that his Skyrizi  keeps him well-controlled at this time.          Follow up:  6 months  Silvano LOISE Dolly Specialty Pharmacist

## 2023-04-28 ENCOUNTER — Other Ambulatory Visit: Payer: Self-pay | Admitting: Pharmacist

## 2023-04-28 ENCOUNTER — Other Ambulatory Visit: Payer: Self-pay

## 2023-04-28 MED ORDER — SKYRIZI 150 MG/ML ~~LOC~~ SOSY
PREFILLED_SYRINGE | SUBCUTANEOUS | 2 refills | Status: DC
  Filled 2023-04-28: qty 1, 84d supply, fill #0

## 2023-04-28 MED ORDER — SKYRIZI 150 MG/ML ~~LOC~~ SOSY: PREFILLED_SYRINGE | SUBCUTANEOUS | 2 refills | Status: DC

## 2023-04-28 NOTE — Addendum Note (Signed)
 Addended by: Santa Lighter on: 04/28/2023 01:19 PM   Modules accepted: Orders

## 2023-05-01 ENCOUNTER — Other Ambulatory Visit (HOSPITAL_COMMUNITY): Payer: Self-pay

## 2023-05-01 ENCOUNTER — Other Ambulatory Visit: Payer: Self-pay

## 2023-05-10 ENCOUNTER — Ambulatory Visit: Payer: Commercial Managed Care - PPO | Attending: Internal Medicine | Admitting: Pharmacist

## 2023-05-10 DIAGNOSIS — Z79899 Other long term (current) drug therapy: Secondary | ICD-10-CM

## 2023-05-10 NOTE — Progress Notes (Signed)
   S: Patient presents for review of their specialty medication therapy.  Patient is currently taking Skyrizi for psoriasis. Patient is managed by Dr. Margo Aye for this.   Adherence: confirms  Efficacy: endorses pretty good efficacy   Dosing: Plaque psoriasis, moderate to severe: SubQ: total dose of 150 mg every 12 weeks  Screening: TB test: completed per patient  Monitoring: S/sx of infection: denies S/sx of hypersensitivity/injection site reaction: denies   O:  Lab Results  Component Value Date   WBC 6.3 03/13/2023   HGB 13.0 03/13/2023   HCT 37.5 (L) 03/13/2023   MCV 87.2 03/13/2023   PLT 287 03/13/2023     Chemistry      Component Value Date/Time   NA 139 03/13/2023 1104   K 3.8 03/13/2023 1104   CL 109 03/13/2023 1104   CO2 23 03/13/2023 1104   BUN 15 03/13/2023 1104   CREATININE 0.90 03/13/2023 1104      Component Value Date/Time   CALCIUM 9.8 03/13/2023 1104   ALKPHOS 96 03/13/2023 1104   AST 11 (L) 03/13/2023 1104   ALT 9 03/13/2023 1104   BILITOT 1.0 03/13/2023 1104      A/P: 1. Medication review: Patient currently on Skyrizi for psoriasis. Reviewed the medication with the patient, including the following: Cristy Folks is a monoclonal antibody used in the treatment of psoriasis. Patient educated on purpose, proper use and potential adverse effects of Skyrizi. Possible adverse effects are infections, headache, and injection site reactions. Live vaccinations should be avoided while on therapy. No recommendations for any changes.   Butch Penny, PharmD, Patsy Baltimore, CPP Clinical Pharmacist [facility] [phone]

## 2023-05-14 ENCOUNTER — Other Ambulatory Visit (HOSPITAL_COMMUNITY): Payer: Self-pay

## 2023-05-14 MED ORDER — VALSARTAN 160 MG PO TABS
160.0000 mg | ORAL_TABLET | Freq: Every day | ORAL | 4 refills | Status: DC
  Filled 2023-05-14: qty 90, 90d supply, fill #0
  Filled 2023-09-21: qty 90, 90d supply, fill #1
  Filled 2024-02-01 – 2024-02-15 (×3): qty 90, 90d supply, fill #2

## 2023-05-15 ENCOUNTER — Other Ambulatory Visit (HOSPITAL_COMMUNITY): Payer: Self-pay

## 2023-05-24 ENCOUNTER — Other Ambulatory Visit: Payer: Self-pay

## 2023-05-26 ENCOUNTER — Other Ambulatory Visit (HOSPITAL_COMMUNITY): Payer: Self-pay

## 2023-05-29 ENCOUNTER — Other Ambulatory Visit: Payer: Self-pay

## 2023-06-01 ENCOUNTER — Other Ambulatory Visit: Payer: Self-pay

## 2023-06-01 ENCOUNTER — Other Ambulatory Visit (HOSPITAL_COMMUNITY): Payer: Self-pay

## 2023-06-01 NOTE — Progress Notes (Signed)
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

## 2023-06-02 ENCOUNTER — Other Ambulatory Visit: Payer: Self-pay

## 2023-06-03 ENCOUNTER — Other Ambulatory Visit (HOSPITAL_COMMUNITY): Payer: Self-pay

## 2023-06-12 ENCOUNTER — Other Ambulatory Visit: Payer: Self-pay

## 2023-06-13 ENCOUNTER — Other Ambulatory Visit: Payer: Self-pay | Admitting: Physician Assistant

## 2023-06-13 ENCOUNTER — Inpatient Hospital Stay: Payer: Commercial Managed Care - PPO | Attending: Hematology and Oncology

## 2023-06-13 ENCOUNTER — Inpatient Hospital Stay: Payer: Commercial Managed Care - PPO | Admitting: Physician Assistant

## 2023-06-13 ENCOUNTER — Other Ambulatory Visit: Payer: Self-pay | Admitting: Hematology and Oncology

## 2023-06-13 ENCOUNTER — Inpatient Hospital Stay: Payer: Commercial Managed Care - PPO

## 2023-06-13 VITALS — BP 132/75 | HR 78 | Temp 98.0°F | Resp 18 | Wt 285.6 lb

## 2023-06-13 DIAGNOSIS — C61 Malignant neoplasm of prostate: Secondary | ICD-10-CM

## 2023-06-13 DIAGNOSIS — Z5111 Encounter for antineoplastic chemotherapy: Secondary | ICD-10-CM | POA: Diagnosis not present

## 2023-06-13 DIAGNOSIS — C7951 Secondary malignant neoplasm of bone: Secondary | ICD-10-CM | POA: Diagnosis not present

## 2023-06-13 DIAGNOSIS — C775 Secondary and unspecified malignant neoplasm of intrapelvic lymph nodes: Secondary | ICD-10-CM

## 2023-06-13 LAB — CBC WITH DIFFERENTIAL (CANCER CENTER ONLY)
Abs Immature Granulocytes: 0.11 10*3/uL — ABNORMAL HIGH (ref 0.00–0.07)
Basophils Absolute: 0.1 10*3/uL (ref 0.0–0.1)
Basophils Relative: 1 %
Eosinophils Absolute: 0.2 10*3/uL (ref 0.0–0.5)
Eosinophils Relative: 2 %
HCT: 39.4 % (ref 39.0–52.0)
Hemoglobin: 13.2 g/dL (ref 13.0–17.0)
Immature Granulocytes: 1 %
Lymphocytes Relative: 9 %
Lymphs Abs: 0.8 10*3/uL (ref 0.7–4.0)
MCH: 29.1 pg (ref 26.0–34.0)
MCHC: 33.5 g/dL (ref 30.0–36.0)
MCV: 87 fL (ref 80.0–100.0)
Monocytes Absolute: 0.3 10*3/uL (ref 0.1–1.0)
Monocytes Relative: 4 %
Neutro Abs: 8 10*3/uL — ABNORMAL HIGH (ref 1.7–7.7)
Neutrophils Relative %: 83 %
Platelet Count: 366 10*3/uL (ref 150–400)
RBC: 4.53 MIL/uL (ref 4.22–5.81)
RDW: 13.9 % (ref 11.5–15.5)
WBC Count: 9.6 10*3/uL (ref 4.0–10.5)
nRBC: 0 % (ref 0.0–0.2)

## 2023-06-13 LAB — CMP (CANCER CENTER ONLY)
ALT: 10 U/L (ref 0–44)
AST: 10 U/L — ABNORMAL LOW (ref 15–41)
Albumin: 4.1 g/dL (ref 3.5–5.0)
Alkaline Phosphatase: 93 U/L (ref 38–126)
Anion gap: 9 (ref 5–15)
BUN: 19 mg/dL (ref 8–23)
CO2: 22 mmol/L (ref 22–32)
Calcium: 9.9 mg/dL (ref 8.9–10.3)
Chloride: 108 mmol/L (ref 98–111)
Creatinine: 0.86 mg/dL (ref 0.61–1.24)
GFR, Estimated: >60 mL/min (ref >60)
Glucose, Bld: 169 mg/dL — ABNORMAL HIGH (ref 70–99)
Potassium: 4.1 mmol/L (ref 3.5–5.1)
Sodium: 139 mmol/L (ref 135–145)
Total Bilirubin: 0.7 mg/dL (ref 0.0–1.2)
Total Protein: 7.4 g/dL (ref 6.5–8.1)

## 2023-06-13 MED ORDER — LEUPROLIDE ACETATE (3 MONTH) 22.5 MG ~~LOC~~ KIT
22.5000 mg | PACK | Freq: Once | SUBCUTANEOUS | Status: AC
  Administered 2023-06-13: 22.5 mg via SUBCUTANEOUS
  Filled 2023-06-13: qty 22.5

## 2023-06-13 NOTE — Progress Notes (Signed)
 [facility] [phone]   Fax:(336) [phone]  PROGRESS NOTE  Patient Care Team: Carylon Perches, MD as PCP - General (Internal Medicine) Jonelle Sidle, MD as PCP - Cardiology (Cardiology)  Hematological/Oncological History # Castrate Sensitive Advanced Prostate Cancer  05/20/2021: Robotic assisted laparoscopic radical prostatectomy, with final pathology showing T3AN0, Gleason score 9 with extracapsular extension. 0 out of 11 lymph nodes were involved with cancer.  12/2021: PSA postoperatively was 0.7  01/2022: completed radiation therapy for oligometastatic lesions in the right acromion treated for 40 Gray in 5 fractions  03/15/2022: last visit with Dr. Clelia Croft 06/13/2022: transfer care to Dr. 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 The patient's last visit was on 03/13/2023. In the interim since the last visit he has continued on Eligard and [place].  On exam today Dr. Jena Gauss reports he has been well overall and interim since her last visit.  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 He is able to complete all his daily ADLs independently. He has a good appetite and weight is stable since last visit. He denies nausea, vomiting or bowel habit changes. He denies any signs of bruising or bleeding episodes.  He denies fevers, chills, sweats, shortness of breath, chest pain or cough. He has no other complaints. A full 10 point ROS was otherwise negative.  He notes he is willing and able to proceed with treatment at this time.  MEDICAL HISTORY:  Past Medical History:  Diagnosis Date   Elevated coronary artery calcium score    Essential hypertension    History of colonic polyps    History of diverticulitis    Hyperlipidemia    Impaired fasting glucose    OSA on CPAP    PONV (postoperative nausea  and vomiting)    Pre-diabetes    Prostate cancer [facility])     SURGICAL HISTORY: Past Surgical History:  Procedure Laterality Date   COLONOSCOPY  05/23/2011   Procedure: COLONOSCOPY;  Surgeon: Rob Bunting, MD;  [facility];  Service: Endoscopy;  Laterality: N/A;   EYE SURGERY     LYMPHADENECTOMY Bilateral 05/20/2021   Procedure: LYMPHADENECTOMY, PELVIC;  Surgeon: Heloise Purpura, MD;  [facility];  Service: Urology;  Laterality: Bilateral;   NASAL SEPTUM SURGERY     Prostate     ROBOT ASSISTED LAPAROSCOPIC RADICAL PROSTATECTOMY N/A 05/20/2021   Procedure: XI ROBOTIC ASSISTED LAPAROSCOPIC RADICAL PROSTATECTOMY LEVEL 2;  Surgeon: Heloise Purpura, MD;  Location[facility];  Service: Urology;  Laterality: N/A;   TONSILLECTOMY      SOCIAL HISTORY: Social History   Socioeconomic History   Marital status: Married    Spouse name: Not on file   Number of children: Not on file   Years of education: Not on file   Highest education level: Not on file  Occupational History   Not on file  Tobacco Use   Smoking status: Never   Smokeless tobacco: Never  Vaping Use   Vaping status: Never Used  Substance and Sexual Activity   Alcohol use: No   Drug use: No   Sexual activity: Not on file  Other Topics Concern   Not on file  Social History Narrative   Not on file   Social Drivers of Health   Financial Resource Strain: Not on file  Food Insecurity: Not on file  Transportation Needs: Not on file  Physical Activity: Not on file  Stress: Not on file  Social Connections: Not on file  Intimate Partner Violence: Not on file    FAMILY HISTORY: Family History  Problem Relation Age of Onset   Hypertension Mother    Dementia Mother    Heart disease Father    Bladder Cancer Father     ALLERGIES:  has no known allergies.  MEDICATIONS:  Current Outpatient Medications  Medication Sig Dispense Refill   abiraterone acetate (ZYTIGA) 250 MG tablet Take 4 tablets (1,000 mg  total) by mouth daily. Take on an empty stomach 1 hour before or 2 hours after a meal 120 tablet 0   abiraterone acetate (ZYTIGA) 250 MG tablet Take 4 tablets (1,000 mg total) by mouth daily. Take on an empty stomach 1 hour before or 2 hours after a meal 120 tablet 0   amLODipine (NORVASC) 10 MG tablet Take 1 tablet (10 mg total) by mouth daily. 90 tablet 4   aspirin EC 81 MG tablet Take 81 mg by mouth daily.     clobetasol cream (TEMOVATE) 0.05 % Apply topically to affected area up to 2 (two) times daily as needed. (not to face, groin, axilla) 60 g 3   escitalopram (LEXAPRO) 20 MG tablet Take 1 tablet (20 mg total) by mouth daily. 90 tablet 4   fluticasone (CUTIVATE) 0.05 % cream Apply topically to affected area 2 (two) times daily as needed 60 g 3   ibuprofen (ADVIL) 800 MG tablet Take 800 mg by mouth.     ketoconazole (NIZORAL) 2 % cream Apply topically to affected area up to 2 (two) times daily as needed. 60 g 3   prednisoLONE acetate (PRED FORTE) 1 % ophthalmic suspension Place 1 drop into the right eye 2 (two) times daily. 5 mL 0   predniSONE (DELTASONE) 5 MG tablet Take 1 tablet (5 mg total) by mouth daily with breakfast. 90 tablet 3   risankizumab-rzaa (SKYRIZI) 150 MG/ML SOSY prefilled syringe Inject 150 mg (1 syringe) into the skin every 12 weeks. 1 mL 2   rosuvastatin (CRESTOR) 20 MG tablet Take 1 tablet (20 mg total) by mouth daily. 90 tablet 3   Semaglutide, 2 MG/DOSE, (OZEMPIC, 2 MG/DOSE,) 8 MG/3ML SOPN Inject 2 mg into the skin once a week. 3 mL 5   valsartan (DIOVAN) 160 MG tablet Take 1 tablet (160 mg total) by mouth daily. 90 tablet 4   levofloxacin (LEVAQUIN) 750 MG tablet Take 750 mg by mouth. (Patient not taking: Reported on 06/13/2023)     risankizumab-rzaa (SKYRIZI) 150 MG/ML SOSY prefilled syringe Inject 150mg  (1 syringe) into skin every 12 weeks. (Patient not taking: Reported on 06/13/2023) 1 mL 0   Semaglutide, 1 MG/DOSE, (OZEMPIC, 1 MG/DOSE,) 4 MG/3ML SOPN Inject 1 mg into  the skin every 7 (seven) days. (Patient not taking: Reported on 06/13/2023) 3 mL 0   No current facility-administered medications for this visit.   Facility-Administered Medications Ordered in Other Visits  Medication Dose Route Frequency Provider Last Rate Last Admin   Leuprolide Acetate (3 Month) (ELIGARD) 22.5 MG injection 22.5 mg  22.5 mg Subcutaneous Once Jaci Standard, MD        REVIEW OF SYSTEMS:   All other systems were reviewed with the patient and are negative.  PHYSICAL EXAMINATION: ECOG PERFORMANCE STATUS: 1 - Symptomatic but completely ambulatory  Vitals:   06/13/23 1321  BP: 132/75  Pulse: 78  Resp: 18  Temp:  98 F (36.7 C)  SpO2: 97%    Filed Weights   06/13/23 1321  Weight: 285 lb 9.6 oz (129.5 kg)    GENERAL: well appearing middle aged Caucasian male, alert, no distress and comfortable SKIN: skin color, texture, turgor are normal, no rashes or significant lesions EYES: conjunctiva are pink and non-injected, sclera clear LUNGS: clear to auscultation and percussion with normal breathing effort HEART: regular rate & rhythm and no murmurs and no lower extremity edema Musculoskeletal: no cyanosis of digits and no clubbing  PSYCH: alert & oriented x 3, fluent speech NEURO: no focal motor/sensory deficits  LABORATORY DATA:  I have reviewed the data as listed    Latest Ref Rng & Units 06/13/2023    1:01 PM 03/13/2023   11:04 AM 12/12/2022   10:42 AM  CBC  WBC 4.0 - 10.5 K/uL 9.6  6.3  6.9   Hemoglobin 13.0 - 17.0 g/dL 40.1  02.7  25.3   Hematocrit 39.0 - 52.0 % 39.4  37.5  39.1   Platelets 150 - 400 K/uL 366  287  288        Latest Ref Rng & Units 06/13/2023    1:01 PM 03/13/2023   11:04 AM 12/12/2022   10:42 AM  CMP  Glucose 70 - 99 mg/dL 664  88  403   BUN 8 - 23 mg/dL 19  15  16    Creatinine 0.61 - 1.24 mg/dL 4.74  2.59  5.63   Sodium 135 - 145 mmol/L 139  139  139   Potassium 3.5 - 5.1 mmol/L 4.1  3.8  3.9   Chloride 98 - 111 mmol/L 108  109   108   CO2 22 - 32 mmol/L 22  23  23    Calcium 8.9 - 10.3 mg/dL 9.9  9.8  9.8   Total Protein 6.5 - 8.1 g/dL 7.4  7.3  7.2   Total Bilirubin 0.0 - 1.2 mg/dL 0.7  1.0  1.0   Alkaline Phos 38 - 126 U/L 93  96  101   AST 15 - 41 U/L 10  11  11    ALT 0 - 44 U/L 10  9  9      RADIOGRAPHIC STUDIES: No results found.  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  # Castrate Sensitive Advanced Prostate Cancer  --Labs today show white blood cell count 9.6, Hgb 13.2, MCV 87.0, Plt 366. Creatinine and LFTs in range.  --Will continue Zytiga 1000 mg p.o. daily with prednisone 5 mg p.o.  Additionally we will continue Eligard, however recommend 22.5 mg (every 3 months dosing) --Last PSMA scan in March 2024 showed excellent response to therapy. No need for repeat imaging unless PSA level starts to rise.  --last PSA on 03/13/2023 was at <0.1. Will recheck PSA and testosterone levels at each 3 month visit.   -- RTC in 3 months for continued monitoring on Zytiga and lupron shots   # Anemia-resolved -- mild and likely 2/2 to radiation therapy. Continue to monitor   No orders of the defined types were placed in this encounter.   All questions were answered. The patient knows to call the clinic with any problems, questions or concerns.  A total of more than 30 minutes were spent on this encounter with face-to-face time and non-face-to-face time, including preparing to see the patient, ordering tests and/or  medications, counseling the patient and coordination of care as outlined above.   Georga Kaufmann PA-C Dept of Hematology and Oncology [facility] Phone: [phone]   06/13/2023 1:54 PM

## 2023-06-14 LAB — PROSTATE-SPECIFIC AG, SERUM (LABCORP): Prostate Specific Ag, Serum: <0.1 ng/mL (ref 0.0–4.0)

## 2023-06-14 LAB — TESTOSTERONE: Testosterone: <3 ng/dL — ABNORMAL LOW (ref 264–916)

## 2023-06-15 ENCOUNTER — Other Ambulatory Visit: Payer: Self-pay

## 2023-06-15 DIAGNOSIS — L400 Psoriasis vulgaris: Secondary | ICD-10-CM | POA: Diagnosis not present

## 2023-06-15 DIAGNOSIS — L821 Other seborrheic keratosis: Secondary | ICD-10-CM | POA: Diagnosis not present

## 2023-06-21 ENCOUNTER — Other Ambulatory Visit: Payer: Self-pay

## 2023-06-23 ENCOUNTER — Other Ambulatory Visit (HOSPITAL_COMMUNITY): Payer: Self-pay

## 2023-06-23 MED ORDER — CLOBETASOL PROPIONATE 0.05 % EX CREA
TOPICAL_CREAM | CUTANEOUS | 3 refills | Status: AC
  Filled 2023-06-23: qty 60, 10d supply, fill #0
  Filled 2023-06-23: qty 60, 15d supply, fill #0

## 2023-06-26 ENCOUNTER — Other Ambulatory Visit (HOSPITAL_COMMUNITY): Payer: Self-pay

## 2023-06-26 ENCOUNTER — Other Ambulatory Visit: Payer: Self-pay

## 2023-06-26 MED ORDER — KETOCONAZOLE 2 % EX CREA
TOPICAL_CREAM | CUTANEOUS | 3 refills | Status: AC
  Filled 2023-06-26: qty 60, 20d supply, fill #0
  Filled 2023-12-26: qty 60, 20d supply, fill #1
  Filled 2024-04-19 – 2024-05-24 (×5): qty 60, 20d supply, fill #2

## 2023-06-29 ENCOUNTER — Other Ambulatory Visit (HOSPITAL_COMMUNITY): Payer: Self-pay

## 2023-06-29 DIAGNOSIS — G4733 Obstructive sleep apnea (adult) (pediatric): Secondary | ICD-10-CM | POA: Diagnosis not present

## 2023-07-03 ENCOUNTER — Other Ambulatory Visit: Payer: Self-pay

## 2023-07-03 ENCOUNTER — Other Ambulatory Visit (HOSPITAL_COMMUNITY): Payer: Self-pay

## 2023-07-03 ENCOUNTER — Other Ambulatory Visit: Payer: Self-pay | Admitting: Hematology and Oncology

## 2023-07-03 DIAGNOSIS — C61 Malignant neoplasm of prostate: Secondary | ICD-10-CM

## 2023-07-03 MED ORDER — ABIRATERONE ACETATE 250 MG PO TABS
1000.0000 mg | ORAL_TABLET | Freq: Every day | ORAL | 0 refills | Status: DC
  Filled 2023-07-03: qty 120, 30d supply, fill #0

## 2023-07-03 NOTE — Progress Notes (Signed)
 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

## 2023-07-05 ENCOUNTER — Other Ambulatory Visit (HOSPITAL_COMMUNITY): Payer: Self-pay

## 2023-07-06 ENCOUNTER — Other Ambulatory Visit (HOSPITAL_COMMUNITY): Payer: Self-pay

## 2023-07-06 ENCOUNTER — Other Ambulatory Visit: Payer: Self-pay

## 2023-07-10 ENCOUNTER — Other Ambulatory Visit (HOSPITAL_COMMUNITY): Payer: Self-pay

## 2023-07-11 ENCOUNTER — Other Ambulatory Visit: Payer: Self-pay

## 2023-07-13 ENCOUNTER — Other Ambulatory Visit: Payer: Self-pay

## 2023-07-14 ENCOUNTER — Other Ambulatory Visit (HOSPITAL_COMMUNITY): Payer: Self-pay

## 2023-07-14 ENCOUNTER — Other Ambulatory Visit: Payer: Self-pay

## 2023-07-14 MED ORDER — TOBRAMYCIN-DEXAMETHASONE 0.3-0.1 % OP SUSP
1.0000 [drp] | Freq: Four times a day (QID) | OPHTHALMIC | 1 refills | Status: AC
Start: 2023-07-14 — End: ?
  Filled 2023-07-14: qty 5, 25d supply, fill #0

## 2023-07-14 NOTE — Progress Notes (Signed)
 Per patient Jesus Ramos has been discontinued by his dermatologist, patient will be disenrolled from this program.

## 2023-07-26 DIAGNOSIS — Z79899 Other long term (current) drug therapy: Secondary | ICD-10-CM | POA: Diagnosis not present

## 2023-07-26 DIAGNOSIS — H532 Diplopia: Secondary | ICD-10-CM | POA: Diagnosis not present

## 2023-07-26 DIAGNOSIS — H5022 Vertical strabismus, left eye: Secondary | ICD-10-CM | POA: Diagnosis not present

## 2023-07-26 DIAGNOSIS — Z9889 Other specified postprocedural states: Secondary | ICD-10-CM | POA: Diagnosis not present

## 2023-07-26 DIAGNOSIS — H53032 Strabismic amblyopia, left eye: Secondary | ICD-10-CM | POA: Diagnosis not present

## 2023-07-26 DIAGNOSIS — H50112 Monocular exotropia, left eye: Secondary | ICD-10-CM | POA: Diagnosis not present

## 2023-07-26 DIAGNOSIS — I10 Essential (primary) hypertension: Secondary | ICD-10-CM | POA: Diagnosis not present

## 2023-07-26 DIAGNOSIS — G4733 Obstructive sleep apnea (adult) (pediatric): Secondary | ICD-10-CM | POA: Diagnosis not present

## 2023-07-26 DIAGNOSIS — H518 Other specified disorders of binocular movement: Secondary | ICD-10-CM | POA: Diagnosis not present

## 2023-08-04 ENCOUNTER — Other Ambulatory Visit: Payer: Self-pay

## 2023-08-07 ENCOUNTER — Other Ambulatory Visit: Payer: Self-pay

## 2023-08-07 ENCOUNTER — Other Ambulatory Visit: Payer: Self-pay | Admitting: Hematology and Oncology

## 2023-08-07 DIAGNOSIS — C61 Malignant neoplasm of prostate: Secondary | ICD-10-CM

## 2023-08-07 MED ORDER — ABIRATERONE ACETATE 250 MG PO TABS
1000.0000 mg | ORAL_TABLET | Freq: Every day | ORAL | 0 refills | Status: DC
Start: 2023-08-07 — End: 2023-08-31
  Filled 2023-08-07: qty 120, 30d supply, fill #0

## 2023-08-07 NOTE — Progress Notes (Signed)
 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   Therapeutic benefit summary: Patient is achieving benefit   Adverse events/side effects summary: No adverse events/side effects   Patient's therapy is appropriate to: Continue    Goals Addressed             This Visit's Progress    Slow Disease Progression   On track    Patient is on track. Patient will maintain adherence. Dr. Drucilla Georgis PSA remains <0.1 ng/mL, last lab from 06/13/23.         Follow up:  6 months  Jesus Ramos Lelynd Poer Specialty Pharmacist

## 2023-08-07 NOTE — Progress Notes (Signed)
 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   This fill date is pending response to refill request from provider. Patient is aware and if they have not received fill by intended date they must follow up with pharmacy.

## 2023-08-09 ENCOUNTER — Other Ambulatory Visit: Payer: Self-pay

## 2023-08-22 ENCOUNTER — Other Ambulatory Visit: Payer: Self-pay

## 2023-08-22 ENCOUNTER — Other Ambulatory Visit (HOSPITAL_COMMUNITY): Payer: Self-pay

## 2023-08-22 MED ORDER — OZEMPIC (2 MG/DOSE) 8 MG/3ML ~~LOC~~ SOPN
2.0000 mg | PEN_INJECTOR | SUBCUTANEOUS | 5 refills | Status: DC
Start: 2023-08-22 — End: 2024-02-19
  Filled 2023-08-22: qty 3, 28d supply, fill #0
  Filled 2023-09-21: qty 3, 28d supply, fill #1
  Filled 2023-10-28: qty 3, 28d supply, fill #2
  Filled 2023-11-26: qty 3, 28d supply, fill #3
  Filled 2023-12-26: qty 3, 28d supply, fill #4
  Filled 2024-01-20: qty 3, 28d supply, fill #5

## 2023-08-28 ENCOUNTER — Telehealth: Payer: Self-pay | Admitting: Hematology and Oncology

## 2023-08-31 ENCOUNTER — Other Ambulatory Visit: Payer: Self-pay

## 2023-08-31 ENCOUNTER — Other Ambulatory Visit: Payer: Self-pay | Admitting: Pharmacy Technician

## 2023-08-31 ENCOUNTER — Other Ambulatory Visit: Payer: Self-pay | Admitting: Hematology and Oncology

## 2023-08-31 DIAGNOSIS — C61 Malignant neoplasm of prostate: Secondary | ICD-10-CM

## 2023-08-31 NOTE — Progress Notes (Signed)
 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     This fill date is pending response to refill request from provider. Patient is aware and if they have not received fill by intended date they must follow up with pharmacy.

## 2023-09-01 ENCOUNTER — Other Ambulatory Visit (HOSPITAL_COMMUNITY): Payer: Self-pay

## 2023-09-01 ENCOUNTER — Other Ambulatory Visit: Payer: Self-pay

## 2023-09-01 MED ORDER — ABIRATERONE ACETATE 250 MG PO TABS
1000.0000 mg | ORAL_TABLET | Freq: Every day | ORAL | 0 refills | Status: DC
  Filled 2023-09-01: qty 120, 30d supply, fill #0

## 2023-09-04 ENCOUNTER — Other Ambulatory Visit (HOSPITAL_COMMUNITY): Payer: Self-pay

## 2023-09-04 ENCOUNTER — Other Ambulatory Visit: Payer: Self-pay

## 2023-09-12 ENCOUNTER — Inpatient Hospital Stay: Payer: Commercial Managed Care - PPO

## 2023-09-12 ENCOUNTER — Inpatient Hospital Stay: Payer: Commercial Managed Care - PPO | Admitting: Physician Assistant

## 2023-09-12 ENCOUNTER — Inpatient Hospital Stay: Attending: Hematology and Oncology

## 2023-09-12 ENCOUNTER — Inpatient Hospital Stay

## 2023-09-12 VITALS — BP 132/75 | HR 70 | Temp 99.2°F | Resp 18

## 2023-09-12 DIAGNOSIS — C61 Malignant neoplasm of prostate: Secondary | ICD-10-CM | POA: Diagnosis not present

## 2023-09-12 DIAGNOSIS — Z79818 Long term (current) use of other agents affecting estrogen receptors and estrogen levels: Secondary | ICD-10-CM | POA: Diagnosis not present

## 2023-09-12 DIAGNOSIS — Z5111 Encounter for antineoplastic chemotherapy: Secondary | ICD-10-CM | POA: Insufficient documentation

## 2023-09-12 DIAGNOSIS — C775 Secondary and unspecified malignant neoplasm of intrapelvic lymph nodes: Secondary | ICD-10-CM

## 2023-09-12 LAB — CBC WITH DIFFERENTIAL (CANCER CENTER ONLY)
Abs Immature Granulocytes: 0.05 10*3/uL (ref 0.00–0.07)
Basophils Absolute: 0.1 10*3/uL (ref 0.0–0.1)
Basophils Relative: 1 %
Eosinophils Absolute: 0.2 10*3/uL (ref 0.0–0.5)
Eosinophils Relative: 2 %
HCT: 39.5 % (ref 39.0–52.0)
Hemoglobin: 13.4 g/dL (ref 13.0–17.0)
Immature Granulocytes: 1 %
Lymphocytes Relative: 9 %
Lymphs Abs: 0.8 10*3/uL (ref 0.7–4.0)
MCH: 29.3 pg (ref 26.0–34.0)
MCHC: 33.9 g/dL (ref 30.0–36.0)
MCV: 86.2 fL (ref 80.0–100.0)
Monocytes Absolute: 0.5 10*3/uL (ref 0.1–1.0)
Monocytes Relative: 5 %
Neutro Abs: 7.6 10*3/uL (ref 1.7–7.7)
Neutrophils Relative %: 82 %
Platelet Count: 307 10*3/uL (ref 150–400)
RBC: 4.58 MIL/uL (ref 4.22–5.81)
RDW: 14.5 % (ref 11.5–15.5)
WBC Count: 9.2 10*3/uL (ref 4.0–10.5)
nRBC: 0 % (ref 0.0–0.2)

## 2023-09-12 LAB — CMP (CANCER CENTER ONLY)
ALT: 10 U/L (ref 0–44)
AST: 11 U/L — ABNORMAL LOW (ref 15–41)
Albumin: 4.3 g/dL (ref 3.5–5.0)
Alkaline Phosphatase: 98 U/L (ref 38–126)
Anion gap: 9 (ref 5–15)
BUN: 15 mg/dL (ref 8–23)
CO2: 23 mmol/L (ref 22–32)
Calcium: 10.1 mg/dL (ref 8.9–10.3)
Chloride: 109 mmol/L (ref 98–111)
Creatinine: 0.91 mg/dL (ref 0.61–1.24)
GFR, Estimated: >60 mL/min (ref >60)
Glucose, Bld: 112 mg/dL — ABNORMAL HIGH (ref 70–99)
Potassium: 3.9 mmol/L (ref 3.5–5.1)
Sodium: 141 mmol/L (ref 135–145)
Total Bilirubin: 0.8 mg/dL (ref 0.0–1.2)
Total Protein: 7.5 g/dL (ref 6.5–8.1)

## 2023-09-12 MED ORDER — LEUPROLIDE ACETATE (3 MONTH) 22.5 MG ~~LOC~~ KIT
22.5000 mg | PACK | Freq: Once | SUBCUTANEOUS | Status: AC
  Administered 2023-09-12: 22.5 mg via SUBCUTANEOUS
  Filled 2023-09-12: qty 22.5

## 2023-09-13 ENCOUNTER — Ambulatory Visit: Payer: Self-pay | Admitting: Physician Assistant

## 2023-09-13 LAB — TESTOSTERONE: Testosterone: <3 ng/dL — ABNORMAL LOW (ref 264–916)

## 2023-09-13 LAB — PROSTATE-SPECIFIC AG, SERUM (LABCORP): Prostate Specific Ag, Serum: <0.1 ng/mL (ref 0.0–4.0)

## 2023-09-21 ENCOUNTER — Other Ambulatory Visit: Payer: Self-pay | Admitting: Hematology and Oncology

## 2023-09-21 MED ORDER — PREDNISONE 5 MG PO TABS
5.0000 mg | ORAL_TABLET | Freq: Every day | ORAL | 3 refills | Status: AC
  Filled 2023-09-21: qty 90, 90d supply, fill #0
  Filled 2023-12-26 – 2024-01-02 (×2): qty 90, 90d supply, fill #1
  Filled 2024-04-19: qty 90, 90d supply, fill #2
  Filled 2024-05-19: qty 90, 90d supply, fill #3

## 2023-09-22 ENCOUNTER — Other Ambulatory Visit (HOSPITAL_COMMUNITY): Payer: Self-pay

## 2023-09-22 ENCOUNTER — Other Ambulatory Visit: Payer: Self-pay

## 2023-09-27 ENCOUNTER — Other Ambulatory Visit: Payer: Self-pay | Admitting: Pharmacy Technician

## 2023-09-27 ENCOUNTER — Other Ambulatory Visit: Payer: Self-pay

## 2023-09-27 ENCOUNTER — Other Ambulatory Visit (HOSPITAL_COMMUNITY): Payer: Self-pay

## 2023-09-27 ENCOUNTER — Other Ambulatory Visit: Payer: Self-pay | Admitting: Hematology and Oncology

## 2023-09-27 DIAGNOSIS — C61 Malignant neoplasm of prostate: Secondary | ICD-10-CM

## 2023-09-27 MED ORDER — ABIRATERONE ACETATE 250 MG PO TABS
1000.0000 mg | ORAL_TABLET | Freq: Every day | ORAL | 0 refills | Status: DC
  Filled 2023-09-27: qty 120, 30d supply, fill #0

## 2023-09-27 NOTE — Progress Notes (Signed)
 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  This fill date is pending response to refill request from provider. Patient is aware and if they have not received fill by intended date they must follow up with pharmacy.

## 2023-10-03 ENCOUNTER — Other Ambulatory Visit: Payer: Self-pay

## 2023-10-03 ENCOUNTER — Other Ambulatory Visit (HOSPITAL_COMMUNITY): Payer: Self-pay

## 2023-10-13 ENCOUNTER — Ambulatory Visit: Payer: Self-pay | Admitting: Cardiology

## 2023-10-23 DIAGNOSIS — G4733 Obstructive sleep apnea (adult) (pediatric): Secondary | ICD-10-CM | POA: Diagnosis not present

## 2023-10-27 ENCOUNTER — Other Ambulatory Visit: Payer: Self-pay | Admitting: Hematology and Oncology

## 2023-10-27 ENCOUNTER — Other Ambulatory Visit: Payer: Self-pay

## 2023-10-27 DIAGNOSIS — C61 Malignant neoplasm of prostate: Secondary | ICD-10-CM

## 2023-10-27 MED ORDER — ABIRATERONE ACETATE 250 MG PO TABS
1000.0000 mg | ORAL_TABLET | Freq: Every day | ORAL | 0 refills | Status: DC
  Filled 2023-10-27: qty 120, 30d supply, fill #0

## 2023-10-28 ENCOUNTER — Other Ambulatory Visit (HOSPITAL_COMMUNITY): Payer: Self-pay

## 2023-10-30 ENCOUNTER — Other Ambulatory Visit: Payer: Self-pay

## 2023-10-30 NOTE — Progress Notes (Signed)
 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

## 2023-10-31 ENCOUNTER — Other Ambulatory Visit: Payer: Self-pay

## 2023-10-31 ENCOUNTER — Other Ambulatory Visit (HOSPITAL_COMMUNITY): Payer: Self-pay

## 2023-11-01 ENCOUNTER — Other Ambulatory Visit: Payer: Self-pay

## 2023-11-08 DIAGNOSIS — E118 Type 2 diabetes mellitus with unspecified complications: Secondary | ICD-10-CM | POA: Diagnosis not present

## 2023-11-08 DIAGNOSIS — S2096XA Insect bite (nonvenomous) of unspecified parts of thorax, initial encounter: Secondary | ICD-10-CM | POA: Diagnosis not present

## 2023-11-27 ENCOUNTER — Other Ambulatory Visit (HOSPITAL_COMMUNITY): Payer: Self-pay

## 2023-11-27 ENCOUNTER — Other Ambulatory Visit: Payer: Self-pay | Admitting: Hematology and Oncology

## 2023-11-27 ENCOUNTER — Other Ambulatory Visit: Payer: Self-pay

## 2023-11-27 DIAGNOSIS — C61 Malignant neoplasm of prostate: Secondary | ICD-10-CM

## 2023-11-27 MED ORDER — ABIRATERONE ACETATE 250 MG PO TABS
1000.0000 mg | ORAL_TABLET | Freq: Every day | ORAL | 0 refills | Status: DC
  Filled 2023-11-27: qty 120, 30d supply, fill #0

## 2023-11-27 NOTE — Progress Notes (Signed)
 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

## 2023-11-30 ENCOUNTER — Other Ambulatory Visit: Payer: Self-pay

## 2023-12-04 ENCOUNTER — Ambulatory Visit: Attending: Cardiology | Admitting: Cardiology

## 2023-12-04 ENCOUNTER — Encounter: Payer: Self-pay | Admitting: Cardiology

## 2023-12-04 VITALS — BP 122/72 | HR 80 | Ht 73.0 in | Wt 278.8 lb

## 2023-12-04 DIAGNOSIS — R931 Abnormal findings on diagnostic imaging of heart and coronary circulation: Secondary | ICD-10-CM

## 2023-12-04 DIAGNOSIS — I10 Essential (primary) hypertension: Secondary | ICD-10-CM | POA: Diagnosis not present

## 2023-12-04 NOTE — Patient Instructions (Signed)
 Medication Instructions:  Your physician recommends that you continue on your current medications as directed. Please refer to the Current Medication list given to you today.   Labwork: None today  Testing/Procedures: None today  Follow-Up: 1 year  Any Other Special Instructions Will Be Listed Below (If Applicable).  If you need a refill on your cardiac medications before your next appointment, please call your pharmacy.

## 2023-12-04 NOTE — Progress Notes (Signed)
    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  He is here for a follow-up visit.  Reports no angina or change in functional capacity with typical activities although less stamina under treatment for advanced prostate cancer.  Still working full-time..  We went over his medications.  He has lost about 40 pounds on Ozempic .  We discussed his recent lipid panel, LDL 74 and HDL 37.  Currently on Crestor  20 mg daily, does miss doses occasionally.  His blood pressure has been well-controlled as reflected in today's measurement.  He is following regularly with Dr. Sheryle.  I reviewed his ECG today which shows normal sinus rhythm.  Physical Exam: VS:  BP 122/72 (BP Location: Left Arm, Cuff Size: Large)   Pulse 80   Ht 6' 1 (1.854 m)   Wt 278 lb 12.8 oz (126.5 kg)   SpO2 96%   BMI 36.78 kg/m , BMI Body mass index is 36.78 kg/m.  Wt Readings from Last 3 Encounters:  12/04/23 278 lb 12.8 oz (126.5 kg)  06/13/23 285 lb 9.6 oz (129.5 kg)  03/13/23 286 lb 11.2 oz (130 kg)    General: Patient appears comfortable at rest. HEENT: Conjunctiva and lids normal. Neck: Supple, no elevated JVP or carotid bruits. Lungs: Clear to auscultation, nonlabored breathing at rest. Cardiac: Regular rate and rhythm, no S3 or significant systolic murmur, no pericardial rub. Abdomen: Bowel sounds present, no bruit.  ECG:  An ECG dated 02/23/2022 was personally reviewed today and demonstrated:  Sinus rhythm.  Labwork: April 2025: Cholesterol 141, triglycerides 174, HDL 37, LDL 74 09/12/2023: ALT 10; AST 11; BUN 15; Creatinine 0.91; Hemoglobin 13.4; Platelet Count 307; Potassium 3.9; Sodium 141  July 2025: Hemoglobin A1c 5.7%  Other Studies Reviewed Today:  No interval cardiac testing for review today.  Assessment and Plan:  1.  Coronary calcium   score of 770 in 2020 with multivessel distribution coronary calcification.  Myocardial perfusion imaging at that time did not demonstrate active ischemia and LVEF 58%.  ECG is normal today.  We have continued medical therapy and plan for risk reduction in the absence of angina.  He remains clinically stable and currently on aspirin 81 mg daily and Crestor  20 mg daily.  Recent LDL 74.  Discussed consistency with Crestor  and also continued weight loss.  We have discussed further evaluation if symptoms intervene (coronary CTA or cardiac catheterization).  Continue observation for now.  2.  Primary hypertension.  Blood pressure well-controlled today.  Continue Norvasc  10 mg daily and Diovan  160 mg daily.  Disposition:  Follow up 1 year.  Signed, Jesus Ramos, M.D., F.A.C.C. [facility] at [facility]

## 2023-12-08 DIAGNOSIS — H02831 Dermatochalasis of right upper eyelid: Secondary | ICD-10-CM | POA: Diagnosis not present

## 2023-12-08 DIAGNOSIS — Z961 Presence of intraocular lens: Secondary | ICD-10-CM | POA: Diagnosis not present

## 2023-12-08 DIAGNOSIS — H53002 Unspecified amblyopia, left eye: Secondary | ICD-10-CM | POA: Diagnosis not present

## 2023-12-08 DIAGNOSIS — H02403 Unspecified ptosis of bilateral eyelids: Secondary | ICD-10-CM | POA: Diagnosis not present

## 2023-12-08 DIAGNOSIS — H02834 Dermatochalasis of left upper eyelid: Secondary | ICD-10-CM | POA: Diagnosis not present

## 2023-12-11 ENCOUNTER — Inpatient Hospital Stay: Payer: Commercial Managed Care - PPO | Admitting: Hematology and Oncology

## 2023-12-11 ENCOUNTER — Inpatient Hospital Stay: Payer: Commercial Managed Care - PPO | Attending: Hematology and Oncology

## 2023-12-11 ENCOUNTER — Inpatient Hospital Stay: Payer: Commercial Managed Care - PPO

## 2023-12-11 ENCOUNTER — Inpatient Hospital Stay

## 2023-12-11 ENCOUNTER — Other Ambulatory Visit: Payer: Self-pay

## 2023-12-11 VITALS — BP 127/79 | HR 71 | Temp 99.9°F | Resp 20

## 2023-12-11 DIAGNOSIS — C61 Malignant neoplasm of prostate: Secondary | ICD-10-CM

## 2023-12-11 DIAGNOSIS — E291 Testicular hypofunction: Secondary | ICD-10-CM | POA: Insufficient documentation

## 2023-12-11 DIAGNOSIS — Z5111 Encounter for antineoplastic chemotherapy: Secondary | ICD-10-CM | POA: Insufficient documentation

## 2023-12-11 LAB — CBC WITH DIFFERENTIAL (CANCER CENTER ONLY)
Abs Immature Granulocytes: 0.05 K/uL (ref 0.00–0.07)
Basophils Absolute: 0.1 K/uL (ref 0.0–0.1)
Basophils Relative: 1 %
Eosinophils Absolute: 0.2 K/uL (ref 0.0–0.5)
Eosinophils Relative: 2 %
HCT: 37.1 % — ABNORMAL LOW (ref 39.0–52.0)
Hemoglobin: 12.5 g/dL — ABNORMAL LOW (ref 13.0–17.0)
Immature Granulocytes: 1 %
Lymphocytes Relative: 8 %
Lymphs Abs: 0.8 K/uL (ref 0.7–4.0)
MCH: 28.5 pg (ref 26.0–34.0)
MCHC: 33.7 g/dL (ref 30.0–36.0)
MCV: 84.7 fL (ref 80.0–100.0)
Monocytes Absolute: 0.5 K/uL (ref 0.1–1.0)
Monocytes Relative: 6 %
Neutro Abs: 7.9 K/uL — ABNORMAL HIGH (ref 1.7–7.7)
Neutrophils Relative %: 82 %
Platelet Count: 280 K/uL (ref 150–400)
RBC: 4.38 MIL/uL (ref 4.22–5.81)
RDW: 13.9 % (ref 11.5–15.5)
WBC Count: 9.5 K/uL (ref 4.0–10.5)
nRBC: 0 % (ref 0.0–0.2)

## 2023-12-11 LAB — CMP (CANCER CENTER ONLY)
ALT: 8 U/L (ref 0–44)
AST: 11 U/L — ABNORMAL LOW (ref 15–41)
Albumin: 4 g/dL (ref 3.5–5.0)
Alkaline Phosphatase: 99 U/L (ref 38–126)
Anion gap: 7 (ref 5–15)
BUN: 16 mg/dL (ref 8–23)
CO2: 24 mmol/L (ref 22–32)
Calcium: 9.5 mg/dL (ref 8.9–10.3)
Chloride: 109 mmol/L (ref 98–111)
Creatinine: 0.86 mg/dL (ref 0.61–1.24)
GFR, Estimated: >60 mL/min (ref >60)
Glucose, Bld: 93 mg/dL (ref 70–99)
Potassium: 3.7 mmol/L (ref 3.5–5.1)
Sodium: 140 mmol/L (ref 135–145)
Total Bilirubin: 0.8 mg/dL (ref 0.0–1.2)
Total Protein: 6.9 g/dL (ref 6.5–8.1)

## 2023-12-11 MED ORDER — LEUPROLIDE ACETATE (3 MONTH) 22.5 MG ~~LOC~~ KIT
22.5000 mg | PACK | Freq: Once | SUBCUTANEOUS | Status: AC
  Administered 2023-12-11: 22.5 mg via SUBCUTANEOUS
  Filled 2023-12-11: qty 22.5

## 2023-12-12 LAB — TESTOSTERONE: Testosterone: <3 ng/dL — ABNORMAL LOW (ref 264–916)

## 2023-12-12 LAB — PROSTATE-SPECIFIC AG, SERUM (LABCORP): Prostate Specific Ag, Serum: <0.1 ng/mL (ref 0.0–4.0)

## 2023-12-22 ENCOUNTER — Other Ambulatory Visit: Payer: Self-pay | Admitting: Hematology and Oncology

## 2023-12-22 ENCOUNTER — Other Ambulatory Visit (HOSPITAL_COMMUNITY): Payer: Self-pay

## 2023-12-22 DIAGNOSIS — C61 Malignant neoplasm of prostate: Secondary | ICD-10-CM

## 2023-12-23 MED ORDER — ABIRATERONE ACETATE 250 MG PO TABS
1000.0000 mg | ORAL_TABLET | Freq: Every day | ORAL | 0 refills | Status: DC
  Filled 2023-12-25 – 2024-01-02 (×3): qty 120, 30d supply, fill #0

## 2023-12-25 ENCOUNTER — Other Ambulatory Visit: Payer: Self-pay

## 2023-12-26 ENCOUNTER — Other Ambulatory Visit: Payer: Self-pay

## 2023-12-26 NOTE — Progress Notes (Signed)
 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

## 2023-12-27 ENCOUNTER — Other Ambulatory Visit: Payer: Self-pay

## 2023-12-27 ENCOUNTER — Other Ambulatory Visit (HOSPITAL_COMMUNITY): Payer: Self-pay

## 2024-01-01 ENCOUNTER — Other Ambulatory Visit (HOSPITAL_COMMUNITY): Payer: Self-pay

## 2024-01-01 ENCOUNTER — Other Ambulatory Visit: Payer: Self-pay

## 2024-01-01 ENCOUNTER — Other Ambulatory Visit (HOSPITAL_BASED_OUTPATIENT_CLINIC_OR_DEPARTMENT_OTHER): Payer: Self-pay

## 2024-01-02 ENCOUNTER — Other Ambulatory Visit: Payer: Self-pay

## 2024-01-02 ENCOUNTER — Other Ambulatory Visit (HOSPITAL_COMMUNITY): Payer: Self-pay

## 2024-01-20 ENCOUNTER — Other Ambulatory Visit (HOSPITAL_COMMUNITY): Payer: Self-pay

## 2024-01-24 DIAGNOSIS — H02834 Dermatochalasis of left upper eyelid: Secondary | ICD-10-CM | POA: Diagnosis not present

## 2024-01-24 DIAGNOSIS — H5022 Vertical strabismus, left eye: Secondary | ICD-10-CM | POA: Diagnosis not present

## 2024-01-24 DIAGNOSIS — H02831 Dermatochalasis of right upper eyelid: Secondary | ICD-10-CM | POA: Diagnosis not present

## 2024-01-24 DIAGNOSIS — G4733 Obstructive sleep apnea (adult) (pediatric): Secondary | ICD-10-CM | POA: Diagnosis not present

## 2024-01-24 DIAGNOSIS — H53032 Strabismic amblyopia, left eye: Secondary | ICD-10-CM | POA: Diagnosis not present

## 2024-01-24 DIAGNOSIS — H50112 Monocular exotropia, left eye: Secondary | ICD-10-CM | POA: Diagnosis not present

## 2024-01-25 ENCOUNTER — Other Ambulatory Visit: Payer: Self-pay

## 2024-01-25 ENCOUNTER — Other Ambulatory Visit: Payer: Self-pay | Admitting: Hematology and Oncology

## 2024-01-25 DIAGNOSIS — C61 Malignant neoplasm of prostate: Secondary | ICD-10-CM

## 2024-01-25 MED ORDER — ABIRATERONE ACETATE 250 MG PO TABS
1000.0000 mg | ORAL_TABLET | Freq: Every day | ORAL | 0 refills | Status: DC
  Filled 2024-01-25 – 2024-01-30 (×3): qty 120, 30d supply, fill #0

## 2024-01-29 ENCOUNTER — Other Ambulatory Visit: Payer: Self-pay

## 2024-01-30 ENCOUNTER — Other Ambulatory Visit: Payer: Self-pay

## 2024-01-31 ENCOUNTER — Other Ambulatory Visit (HOSPITAL_COMMUNITY): Payer: Self-pay

## 2024-02-01 ENCOUNTER — Other Ambulatory Visit (HOSPITAL_COMMUNITY): Payer: Self-pay

## 2024-02-01 ENCOUNTER — Other Ambulatory Visit: Payer: Self-pay

## 2024-02-02 ENCOUNTER — Other Ambulatory Visit: Payer: Self-pay

## 2024-02-02 ENCOUNTER — Other Ambulatory Visit (HOSPITAL_COMMUNITY): Payer: Self-pay

## 2024-02-02 MED ORDER — ESCITALOPRAM OXALATE 20 MG PO TABS
20.0000 mg | ORAL_TABLET | Freq: Every day | ORAL | 4 refills | Status: AC
  Filled 2024-02-02: qty 90, 90d supply, fill #0

## 2024-02-05 ENCOUNTER — Encounter: Payer: Self-pay | Admitting: Pharmacist

## 2024-02-05 ENCOUNTER — Other Ambulatory Visit: Payer: Self-pay

## 2024-02-08 ENCOUNTER — Other Ambulatory Visit: Payer: Self-pay

## 2024-02-13 DIAGNOSIS — E118 Type 2 diabetes mellitus with unspecified complications: Secondary | ICD-10-CM | POA: Diagnosis not present

## 2024-02-13 DIAGNOSIS — Z79899 Other long term (current) drug therapy: Secondary | ICD-10-CM | POA: Diagnosis not present

## 2024-02-15 ENCOUNTER — Other Ambulatory Visit: Payer: Self-pay

## 2024-02-15 ENCOUNTER — Other Ambulatory Visit (HOSPITAL_COMMUNITY): Payer: Self-pay

## 2024-02-16 ENCOUNTER — Other Ambulatory Visit (HOSPITAL_COMMUNITY): Payer: Self-pay

## 2024-02-16 MED ORDER — AMLODIPINE BESYLATE 10 MG PO TABS
10.0000 mg | ORAL_TABLET | Freq: Every day | ORAL | 4 refills | Status: AC
  Filled 2024-02-16: qty 90, 90d supply, fill #0
  Filled 2024-05-19: qty 90, 90d supply, fill #1

## 2024-02-18 ENCOUNTER — Other Ambulatory Visit (HOSPITAL_COMMUNITY): Payer: Self-pay

## 2024-02-19 ENCOUNTER — Other Ambulatory Visit: Payer: Self-pay

## 2024-02-19 ENCOUNTER — Other Ambulatory Visit (HOSPITAL_COMMUNITY): Payer: Self-pay

## 2024-02-19 MED ORDER — OZEMPIC (2 MG/DOSE) 8 MG/3ML ~~LOC~~ SOPN
2.0000 mg | PEN_INJECTOR | SUBCUTANEOUS | 5 refills | Status: AC
  Filled 2024-02-19 – 2024-02-23 (×2): qty 3, 28d supply, fill #0
  Filled 2024-03-18: qty 3, 28d supply, fill #1
  Filled 2024-04-19: qty 3, 28d supply, fill #2
  Filled 2024-05-19: qty 3, 28d supply, fill #3

## 2024-02-20 ENCOUNTER — Other Ambulatory Visit: Payer: Self-pay

## 2024-02-21 ENCOUNTER — Other Ambulatory Visit (HOSPITAL_COMMUNITY): Payer: Self-pay

## 2024-02-22 ENCOUNTER — Other Ambulatory Visit (HOSPITAL_COMMUNITY): Payer: Self-pay

## 2024-02-22 ENCOUNTER — Other Ambulatory Visit: Payer: Self-pay | Admitting: Hematology and Oncology

## 2024-02-22 ENCOUNTER — Other Ambulatory Visit: Payer: Self-pay

## 2024-02-22 DIAGNOSIS — C61 Malignant neoplasm of prostate: Secondary | ICD-10-CM

## 2024-02-22 MED ORDER — ABIRATERONE ACETATE 250 MG PO TABS
1000.0000 mg | ORAL_TABLET | Freq: Every day | ORAL | 0 refills | Status: DC
  Filled 2024-02-22 – 2024-02-27 (×3): qty 120, 30d supply, fill #0

## 2024-02-23 ENCOUNTER — Other Ambulatory Visit: Payer: Self-pay

## 2024-02-23 ENCOUNTER — Other Ambulatory Visit (HOSPITAL_BASED_OUTPATIENT_CLINIC_OR_DEPARTMENT_OTHER): Payer: Self-pay

## 2024-02-23 ENCOUNTER — Other Ambulatory Visit (HOSPITAL_COMMUNITY): Payer: Self-pay

## 2024-02-26 ENCOUNTER — Other Ambulatory Visit (HOSPITAL_COMMUNITY): Payer: Self-pay

## 2024-02-26 ENCOUNTER — Encounter (INDEPENDENT_AMBULATORY_CARE_PROVIDER_SITE_OTHER): Payer: Self-pay

## 2024-02-26 ENCOUNTER — Other Ambulatory Visit: Payer: Self-pay

## 2024-02-27 ENCOUNTER — Other Ambulatory Visit: Payer: Self-pay

## 2024-02-27 ENCOUNTER — Telehealth: Payer: Self-pay

## 2024-02-27 ENCOUNTER — Other Ambulatory Visit (HOSPITAL_COMMUNITY): Payer: Self-pay

## 2024-02-27 NOTE — Telephone Encounter (Signed)
 Oral Oncology Patient Advocate Encounter  Prior Authorization for abiraterone  acetate (ZYTIGA ) 250 MG tablet  has been approved.    PA# [id number] Effective dates: 02/27/24 through 02/25/25     Lucie Lamer, CPhT [place]  [facility] Specialty Pharmacy Services Oncology Pharmacy Patient Advocate Specialist [facility] [place] Phone: [phone]  Fax: [phone] [email or web address]

## 2024-02-27 NOTE — Telephone Encounter (Signed)
 Oral Oncology Patient Advocate Encounter   Received notification that prior authorization for abiraterone  acetate (ZYTIGA ) 250 MG tablet  is required.   PA submitted on 02/27/24 [facility] Status is pending     Lucie Lamer, CPhT [place]  [facility] Specialty Pharmacy Services Oncology Pharmacy Patient Advocate Specialist [facility] [place] Phone: [phone]  Fax: [phone] [email or web address]

## 2024-02-27 NOTE — Progress Notes (Signed)
 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  Doses on hand: (Patient-Rptd) 12   Patient requested: (Patient-Rptd) Delivery   Delivery date: 02/29/24  Verified address: [address] [place]  [address]  Medication will be filled on 02/28/24.   PA Approved

## 2024-02-27 NOTE — Progress Notes (Signed)
 Benefits Investigation Started  Reason: Prior Authorization Required  Routed to: [facility]

## 2024-02-28 ENCOUNTER — Other Ambulatory Visit: Payer: Self-pay

## 2024-02-28 NOTE — Progress Notes (Signed)
 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   Therapeutic benefit summary: Patient is achieving benefit   Adverse events/side effects summary: No adverse events/side effects   Patient's therapy is appropriate to: Continue    Goals Addressed             This Visit's Progress    Slow Disease Progression   On track    Patient is on track. Patient will maintain adherence. Dr. Ivonne PSA remains <0.1 ng/mL, last lab from 12/11/23.         Follow up: 6 months  [facility]

## 2024-03-07 ENCOUNTER — Other Ambulatory Visit (HOSPITAL_COMMUNITY): Payer: Self-pay

## 2024-03-07 DIAGNOSIS — H02831 Dermatochalasis of right upper eyelid: Secondary | ICD-10-CM | POA: Diagnosis not present

## 2024-03-07 DIAGNOSIS — H02834 Dermatochalasis of left upper eyelid: Secondary | ICD-10-CM | POA: Diagnosis not present

## 2024-03-07 MED ORDER — ERYTHROMYCIN 5 MG/GM OP OINT
TOPICAL_OINTMENT | Freq: Three times a day (TID) | OPHTHALMIC | 3 refills | Status: AC
  Filled 2024-03-07: qty 3.5, 7d supply, fill #0

## 2024-03-08 ENCOUNTER — Other Ambulatory Visit (HOSPITAL_COMMUNITY): Payer: Self-pay

## 2024-03-10 ENCOUNTER — Other Ambulatory Visit: Payer: Self-pay | Admitting: Hematology and Oncology

## 2024-03-10 DIAGNOSIS — C61 Malignant neoplasm of prostate: Secondary | ICD-10-CM

## 2024-03-10 NOTE — Progress Notes (Unsigned)
 [facility] [phone]   [phone]  PROGRESS NOTE  Patient Care Team: Sheryle Carwin, MD as PCP - General (Internal Medicine) Debera Jayson MATSU, MD as PCP - Cardiology (Cardiology)  Hematological/Oncological History # Castrate Sensitive Advanced Prostate Cancer  05/20/2021: Robotic assisted laparoscopic radical prostatectomy, with final pathology showing T3AN0, Gleason score 9 with extracapsular extension. 0 out of 11 lymph nodes were involved with cancer.  12/2021: PSA postoperatively was 0.7  01/2022: completed radiation therapy for oligometastatic lesions in the right acromion treated for 40 Gray in 5 fractions  03/15/2022: last visit with Dr. Amadeo 06/13/2022: transfer care to Dr. 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 The patient's last visit was on 06/13/2023. In the interim since the last visit he has continued on Eligard  and Zytiga .  On exam today Dr. Hollingshead reports he has been well overall in the interim since our last visit.  He had a surgery on his eyelids and does have some bruising around his eyelids bilaterally.  He reports he is applying ointment and it does not painful.  He reports that he will be going to the beach with his mom and sister for Thanksgiving.  He reports that his wife is the cook.  He notes he is taking Zytiga  pills faithfully and has had no missed dosages.  He reports he is also taking the Lupron  as prescribed.  He reports that he has his usual transient arthritic pain and some occasional fatigue.  He reports that he will be doing his best to go to the gym as part of his new Heras resolution.  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  He reports that he has also been taking Ozempic  and intentionally losing  weight, down to 276 pounds today.  He reports he is only ping about $20 a month for his Zytiga .  Overall he feels well and has no additional questions concerns or complaints today.  A full 10 point ROS is otherwise negative.  MEDICAL HISTORY:  Past Medical History:  Diagnosis Date   Elevated coronary artery calcium  score    Essential hypertension    History of colonic polyps    History of diverticulitis    Hyperlipidemia    Impaired fasting glucose    OSA on CPAP    PONV (postoperative nausea and vomiting)    Pre-diabetes    Prostate cancer [facility])     SURGICAL HISTORY: Past Surgical History:  Procedure Laterality Date   COLONOSCOPY  05/23/2011   Procedure: COLONOSCOPY;  Surgeon: Toribio Cedar, MD;  Location: [facility] ENDOSCOPY;  Service: Endoscopy;  Laterality: N/A;   EYE SURGERY     LYMPHADENECTOMY Bilateral 05/20/2021   Procedure: LYMPHADENECTOMY, PELVIC;  Surgeon: Renda Glance, MD;  Location: [facility] ORS;  Service: Urology;  Laterality: Bilateral;   NASAL SEPTUM SURGERY     Prostate     ROBOT ASSISTED LAPAROSCOPIC RADICAL PROSTATECTOMY N/A 05/20/2021   Procedure: XI ROBOTIC ASSISTED LAPAROSCOPIC RADICAL PROSTATECTOMY LEVEL 2;  Surgeon: Renda Glance, MD;  Location: [facility] ORS;  Service: Urology;  Laterality: N/A;   TONSILLECTOMY      SOCIAL HISTORY: Social History   Socioeconomic History   Marital status: Married    Spouse name: Not on file   Number of children:  Not on file   Years of education: Not on file   Highest education level: Not on file  Occupational History   Not on file  Tobacco Use   Smoking status: Never   Smokeless tobacco: Never  Vaping Use   Vaping status: Never Used  Substance and Sexual Activity   Alcohol use: No   Drug use: No   Sexual activity: Not on file  Other Topics Concern   Not on file  Social History Narrative   Not on file   Social Drivers of Health   Financial Resource Strain: Not on file  Food Insecurity: Not on file  Transportation  Needs: Not on file  Physical Activity: Not on file  Stress: Not on file  Social Connections: Not on file  Intimate Partner Violence: Not on file    FAMILY HISTORY: Family History  Problem Relation Age of Onset   Hypertension Mother    Dementia Mother    Heart disease Father    Bladder Cancer Father     ALLERGIES:  has no known allergies.  MEDICATIONS:  Current Outpatient Medications  Medication Sig Dispense Refill   abiraterone  acetate (ZYTIGA ) 250 MG tablet Take 4 tablets (1,000 mg total) by mouth daily. Take on an empty stomach 1 hour before or 2 hours after a meal 120 tablet 0   abiraterone  acetate (ZYTIGA ) 250 MG tablet Take 4 tablets (1,000 mg total) by mouth daily. Take on an empty stomach 1 hour before or 2 hours after a meal 120 tablet 0   amLODipine  (NORVASC ) 10 MG tablet Take 1 tablet (10 mg total) by mouth daily. 90 tablet 4   aspirin EC 81 MG tablet Take 81 mg by mouth daily.     clobetasol  cream (TEMOVATE ) 0.05 % Apply topically to affected area up to 2 (two) times daily as needed. (not to face, groin, axilla) 60 g 3   clobetasol  cream (TEMOVATE ) 0.05 % Apply to affected areas up to twice a day as needed.  Do not apply to face, groin or axilla 60 g 3   erythromycin  ophthalmic ointment Apply small amount to surgical site three times per day 3.5 g 3   escitalopram  (LEXAPRO ) 20 MG tablet Take 1 tablet (20 mg total) by mouth daily. 90 tablet 4   fluticasone  (CUTIVATE ) 0.05 % cream Apply topically to affected area 2 (two) times daily as needed 60 g 3   fluticasone  0.05%-ketoconazole  2% 1:1 cream mixture Apply to affected areas up to twice a day as needed. 60 g 3   ibuprofen  (ADVIL ) 800 MG tablet Take 800 mg by mouth. (Patient not taking: Reported on 12/04/2023)     ketoconazole  (NIZORAL ) 2 % cream Apply topically to affected area up to 2 (two) times daily as needed. 60 g 3   prednisoLONE  acetate (PRED FORTE ) 1 % ophthalmic suspension Place 1 drop into the right eye 2 (two)  times daily. (Patient not taking: Reported on 12/04/2023) 5 mL 0   predniSONE  (DELTASONE ) 5 MG tablet Take 1 tablet (5 mg total) by mouth daily with breakfast. 90 tablet 3   rosuvastatin  (CRESTOR ) 20 MG tablet Take 1 tablet (20 mg total) by mouth daily. 90 tablet 3   Semaglutide , 2 MG/DOSE, (OZEMPIC , 2 MG/DOSE,) 8 MG/3ML SOPN Inject 2 mg into the skin once a week. 3 mL 5   tobramycin -dexamethasone  (TOBRADEX ) ophthalmic solution Place 1 drop into the left eye 4 (four) times daily for 10 days. (Patient not taking: Reported on 12/04/2023) 10 mL  1   valsartan  (DIOVAN ) 160 MG tablet Take 1 tablet (160 mg total) by mouth daily. 90 tablet 4   No current facility-administered medications for this visit.    REVIEW OF SYSTEMS:   All other systems were reviewed with the patient and are negative.  PHYSICAL EXAMINATION: ECOG PERFORMANCE STATUS: 1 - Symptomatic but completely ambulatory  There were no vitals filed for this visit.   There were no vitals filed for this visit.   GENERAL: well appearing middle aged Caucasian male, alert, no distress and comfortable SKIN: skin color, texture, turgor are normal, no rashes or significant lesions EYES: conjunctiva are pink and non-injected, sclera clear LUNGS: clear to auscultation and percussion with normal breathing effort HEART: regular rate & rhythm and no murmurs and no lower extremity edema Musculoskeletal: no cyanosis of digits and no clubbing  PSYCH: alert & oriented x 3, fluent speech NEURO: no focal motor/sensory deficits  LABORATORY DATA:  I have reviewed the data as listed    Latest Ref Rng & Units 12/11/2023    2:40 PM 09/12/2023   12:50 PM 06/13/2023    1:01 PM  CBC  WBC 4.0 - 10.5 K/uL 9.5  9.2  9.6   Hemoglobin 13.0 - 17.0 g/dL 87.4  86.5  86.7   Hematocrit 39.0 - 52.0 % 37.1  39.5  39.4   Platelets 150 - 400 K/uL 280  307  366        Latest Ref Rng & Units 12/11/2023    2:40 PM 09/12/2023   12:50 PM 06/13/2023    1:01 PM  CMP   Glucose 70 - 99 mg/dL 93  887  830   BUN 8 - 23 mg/dL 16  15  19    Creatinine 0.61 - 1.24 mg/dL 9.13  9.08  9.13   Sodium 135 - 145 mmol/L 140  141  139   Potassium 3.5 - 5.1 mmol/L 3.7  3.9  4.1   Chloride 98 - 111 mmol/L 109  109  108   CO2 22 - 32 mmol/L 24  23  22    Calcium  8.9 - 10.3 mg/dL 9.5  89.8  9.9   Total Protein 6.5 - 8.1 g/dL 6.9  7.5  7.4   Total Bilirubin 0.0 - 1.2 mg/dL 0.8  0.8  0.7   Alkaline Phos 38 - 126 U/L 99  98  93   AST 15 - 41 U/L 11  11  10    ALT 0 - 44 U/L 8  10  10      RADIOGRAPHIC STUDIES: No results found.  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  # Castrate Sensitive Advanced Prostate Cancer  --Labs today show white blood cell count 9.2, hemoglobin 13.1, MCV 85.1, platelets 298. Creatinine and LFTs in range.  --Will continue Zytiga  1000 mg p.o. daily with prednisone  5 mg p.o.  Additionally we will continue Eligard , however recommend 22.5 mg (every 3 months dosing) --Last PSMA scan in March 2024 showed excellent response to therapy. No need for repeat imaging unless PSA level starts to rise.  --last PSA on 03/13/2023 was at <0.1. Will recheck PSA and testosterone  levels at each 3 month visit.   -- RTC in 3 months for continued monitoring on Zytiga  and lupron  shots   # Anemia-resolved -- mild and likely 2/2 to radiation therapy. Continue to monitor   No orders  of the defined types were placed in this encounter.   All questions were answered. The patient knows to call the clinic with any problems, questions or concerns.  A total of more than 30 minutes were spent on this encounter with face-to-face time and non-face-to-face time, including preparing to see the patient, ordering tests and/or medications, counseling the patient and coordination of care as outlined above.   Norleen IVAR Kidney, MD Department of Hematology/Oncology [facility] at [facility] Phone: [phone] Pager: [phone] Email: [email or web address]    03/10/2024 7:40 PM

## 2024-03-11 ENCOUNTER — Inpatient Hospital Stay: Payer: Commercial Managed Care - PPO

## 2024-03-11 ENCOUNTER — Inpatient Hospital Stay: Payer: Commercial Managed Care - PPO | Admitting: Hematology and Oncology

## 2024-03-11 ENCOUNTER — Inpatient Hospital Stay: Payer: Commercial Managed Care - PPO | Attending: Hematology and Oncology

## 2024-03-11 VITALS — BP 124/84 | HR 77 | Temp 97.2°F | Resp 14 | Wt 276.5 lb

## 2024-03-11 DIAGNOSIS — Z860101 Personal history of adenomatous and serrated colon polyps: Secondary | ICD-10-CM | POA: Diagnosis not present

## 2024-03-11 DIAGNOSIS — Z79899 Other long term (current) drug therapy: Secondary | ICD-10-CM | POA: Diagnosis not present

## 2024-03-11 DIAGNOSIS — I10 Essential (primary) hypertension: Secondary | ICD-10-CM | POA: Diagnosis not present

## 2024-03-11 DIAGNOSIS — C775 Secondary and unspecified malignant neoplasm of intrapelvic lymph nodes: Secondary | ICD-10-CM | POA: Diagnosis not present

## 2024-03-11 DIAGNOSIS — C61 Malignant neoplasm of prostate: Secondary | ICD-10-CM | POA: Diagnosis not present

## 2024-03-11 DIAGNOSIS — Z7952 Long term (current) use of systemic steroids: Secondary | ICD-10-CM | POA: Diagnosis not present

## 2024-03-11 DIAGNOSIS — C7951 Secondary malignant neoplasm of bone: Secondary | ICD-10-CM | POA: Diagnosis not present

## 2024-03-11 DIAGNOSIS — G4730 Sleep apnea, unspecified: Secondary | ICD-10-CM | POA: Diagnosis not present

## 2024-03-11 DIAGNOSIS — Z7982 Long term (current) use of aspirin: Secondary | ICD-10-CM | POA: Diagnosis not present

## 2024-03-11 DIAGNOSIS — E785 Hyperlipidemia, unspecified: Secondary | ICD-10-CM | POA: Diagnosis not present

## 2024-03-11 DIAGNOSIS — Z79818 Long term (current) use of other agents affecting estrogen receptors and estrogen levels: Secondary | ICD-10-CM | POA: Diagnosis not present

## 2024-03-11 LAB — CBC WITH DIFFERENTIAL (CANCER CENTER ONLY)
Abs Immature Granulocytes: 0.04 K/uL (ref 0.00–0.07)
Basophils Absolute: 0.1 K/uL (ref 0.0–0.1)
Basophils Relative: 1 %
Eosinophils Absolute: 0.3 K/uL (ref 0.0–0.5)
Eosinophils Relative: 3 %
HCT: 38.4 % — ABNORMAL LOW (ref 39.0–52.0)
Hemoglobin: 13.1 g/dL (ref 13.0–17.0)
Immature Granulocytes: 0 %
Lymphocytes Relative: 11 %
Lymphs Abs: 1 K/uL (ref 0.7–4.0)
MCH: 29 pg (ref 26.0–34.0)
MCHC: 34.1 g/dL (ref 30.0–36.0)
MCV: 85.1 fL (ref 80.0–100.0)
Monocytes Absolute: 0.6 K/uL (ref 0.1–1.0)
Monocytes Relative: 7 %
Neutro Abs: 7.1 K/uL (ref 1.7–7.7)
Neutrophils Relative %: 78 %
Platelet Count: 298 K/uL (ref 150–400)
RBC: 4.51 MIL/uL (ref 4.22–5.81)
RDW: 14.4 % (ref 11.5–15.5)
WBC Count: 9.2 K/uL (ref 4.0–10.5)
nRBC: 0 % (ref 0.0–0.2)

## 2024-03-11 LAB — PSA: Prostatic Specific Antigen: <0.02 ng/mL (ref 0.00–4.00)

## 2024-03-11 LAB — CMP (CANCER CENTER ONLY)
ALT: 10 U/L (ref 0–44)
AST: 16 U/L (ref 15–41)
Albumin: 4.1 g/dL (ref 3.5–5.0)
Alkaline Phosphatase: 104 U/L (ref 38–126)
Anion gap: 11 (ref 5–15)
BUN: 16 mg/dL (ref 8–23)
CO2: 21 mmol/L — ABNORMAL LOW (ref 22–32)
Calcium: 10.1 mg/dL (ref 8.9–10.3)
Chloride: 108 mmol/L (ref 98–111)
Creatinine: 0.89 mg/dL (ref 0.61–1.24)
GFR, Estimated: >60 mL/min (ref >60)
Glucose, Bld: 106 mg/dL — ABNORMAL HIGH (ref 70–99)
Potassium: 4.1 mmol/L (ref 3.5–5.1)
Sodium: 140 mmol/L (ref 135–145)
Total Bilirubin: 0.7 mg/dL (ref 0.0–1.2)
Total Protein: 7.3 g/dL (ref 6.5–8.1)

## 2024-03-11 MED ORDER — LEUPROLIDE ACETATE (3 MONTH) 22.5 MG ~~LOC~~ KIT
22.5000 mg | PACK | Freq: Once | SUBCUTANEOUS | Status: AC
  Administered 2024-03-11: 22.5 mg via SUBCUTANEOUS
  Filled 2024-03-11: qty 22.5

## 2024-03-12 ENCOUNTER — Encounter: Payer: Self-pay | Admitting: Hematology and Oncology

## 2024-03-12 LAB — TESTOSTERONE: Testosterone: <3 ng/dL — ABNORMAL LOW (ref 264–916)

## 2024-03-18 ENCOUNTER — Other Ambulatory Visit (HOSPITAL_COMMUNITY): Payer: Self-pay

## 2024-03-28 ENCOUNTER — Other Ambulatory Visit: Payer: Self-pay

## 2024-03-28 ENCOUNTER — Other Ambulatory Visit: Payer: Self-pay | Admitting: Hematology and Oncology

## 2024-03-28 DIAGNOSIS — C61 Malignant neoplasm of prostate: Secondary | ICD-10-CM

## 2024-03-28 MED ORDER — ABIRATERONE ACETATE 250 MG PO TABS
1000.0000 mg | ORAL_TABLET | Freq: Every day | ORAL | 0 refills | Status: DC
  Filled 2024-03-28: qty 120, 30d supply, fill #0

## 2024-04-02 ENCOUNTER — Other Ambulatory Visit: Payer: Self-pay

## 2024-04-02 NOTE — Progress Notes (Signed)
 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  [place] [address]   Medication will be filled on: 04/03/24

## 2024-04-03 ENCOUNTER — Other Ambulatory Visit: Payer: Self-pay

## 2024-04-09 ENCOUNTER — Other Ambulatory Visit: Payer: Self-pay

## 2024-04-17 ENCOUNTER — Other Ambulatory Visit (HOSPITAL_COMMUNITY): Payer: Self-pay

## 2024-04-19 ENCOUNTER — Other Ambulatory Visit: Payer: Self-pay

## 2024-04-19 ENCOUNTER — Other Ambulatory Visit: Payer: Self-pay | Admitting: Cardiology

## 2024-04-20 ENCOUNTER — Other Ambulatory Visit (HOSPITAL_COMMUNITY): Payer: Self-pay

## 2024-04-22 ENCOUNTER — Other Ambulatory Visit (HOSPITAL_COMMUNITY): Payer: Self-pay

## 2024-04-22 ENCOUNTER — Other Ambulatory Visit: Payer: Self-pay

## 2024-04-22 MED FILL — Rosuvastatin Calcium Tab 20 MG: ORAL | 90 days supply | Qty: 90 | Fill #0 | Status: AC

## 2024-04-24 ENCOUNTER — Other Ambulatory Visit (HOSPITAL_COMMUNITY): Payer: Self-pay

## 2024-04-25 ENCOUNTER — Other Ambulatory Visit (HOSPITAL_COMMUNITY): Payer: Self-pay

## 2024-04-26 ENCOUNTER — Other Ambulatory Visit (HOSPITAL_COMMUNITY): Payer: Self-pay

## 2024-04-26 ENCOUNTER — Other Ambulatory Visit: Payer: Self-pay | Admitting: Hematology and Oncology

## 2024-04-26 ENCOUNTER — Other Ambulatory Visit: Payer: Self-pay

## 2024-04-26 DIAGNOSIS — C61 Malignant neoplasm of prostate: Secondary | ICD-10-CM

## 2024-04-26 MED ORDER — ABIRATERONE ACETATE 250 MG PO TABS
1000.0000 mg | ORAL_TABLET | Freq: Every day | ORAL | 0 refills | Status: DC
  Filled 2024-04-26 – 2024-04-29 (×2): qty 120, 30d supply, fill #0

## 2024-04-29 ENCOUNTER — Other Ambulatory Visit: Payer: Self-pay

## 2024-04-29 ENCOUNTER — Other Ambulatory Visit (HOSPITAL_COMMUNITY): Payer: Self-pay

## 2024-05-01 ENCOUNTER — Other Ambulatory Visit: Payer: Self-pay

## 2024-05-02 ENCOUNTER — Other Ambulatory Visit: Payer: Self-pay

## 2024-05-02 NOTE — Progress Notes (Signed)
 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  [place] [address]   Medication will be filled on: 05/02/24

## 2024-05-19 ENCOUNTER — Other Ambulatory Visit (HOSPITAL_COMMUNITY): Payer: Self-pay

## 2024-05-20 ENCOUNTER — Other Ambulatory Visit (HOSPITAL_COMMUNITY): Payer: Self-pay

## 2024-05-20 ENCOUNTER — Other Ambulatory Visit: Payer: Self-pay

## 2024-05-21 ENCOUNTER — Other Ambulatory Visit (HOSPITAL_COMMUNITY): Payer: Self-pay

## 2024-05-21 ENCOUNTER — Other Ambulatory Visit: Payer: Self-pay

## 2024-05-21 MED ORDER — VALSARTAN 160 MG PO TABS
160.0000 mg | ORAL_TABLET | Freq: Every day | ORAL | 4 refills | Status: AC
  Filled 2024-05-21: qty 90, 90d supply, fill #0

## 2024-05-23 ENCOUNTER — Other Ambulatory Visit (HOSPITAL_COMMUNITY): Payer: Self-pay

## 2024-05-23 ENCOUNTER — Other Ambulatory Visit: Payer: Self-pay

## 2024-05-23 ENCOUNTER — Encounter: Payer: Self-pay | Admitting: Hematology and Oncology

## 2024-05-23 MED FILL — Rosuvastatin Calcium Tab 20 MG: ORAL | 90 days supply | Qty: 90 | Fill #1 | Status: AC

## 2024-05-24 ENCOUNTER — Other Ambulatory Visit: Payer: Self-pay | Admitting: Hematology and Oncology

## 2024-05-24 ENCOUNTER — Other Ambulatory Visit: Payer: Self-pay

## 2024-05-24 ENCOUNTER — Other Ambulatory Visit (HOSPITAL_COMMUNITY): Payer: Self-pay

## 2024-05-24 DIAGNOSIS — C61 Malignant neoplasm of prostate: Secondary | ICD-10-CM

## 2024-05-24 MED ORDER — ABIRATERONE ACETATE 250 MG PO TABS
1000.0000 mg | ORAL_TABLET | Freq: Every day | ORAL | 0 refills | Status: AC
  Filled 2024-05-24: qty 120, 30d supply, fill #0

## 2024-06-03 ENCOUNTER — Inpatient Hospital Stay

## 2024-06-03 ENCOUNTER — Inpatient Hospital Stay: Admitting: Hematology and Oncology

## 2024-06-03 ENCOUNTER — Inpatient Hospital Stay: Attending: Hematology and Oncology

## 2024-08-26 ENCOUNTER — Inpatient Hospital Stay

## 2024-08-26 ENCOUNTER — Inpatient Hospital Stay: Admitting: Hematology and Oncology

## 2024-08-26 ENCOUNTER — Inpatient Hospital Stay: Attending: Hematology and Oncology

## 2024-12-09 ENCOUNTER — Inpatient Hospital Stay: Attending: Hematology and Oncology

## 2024-12-09 ENCOUNTER — Inpatient Hospital Stay: Admitting: Hematology and Oncology

## 2024-12-09 ENCOUNTER — Inpatient Hospital Stay

## 2025-03-17 ENCOUNTER — Inpatient Hospital Stay

## 2025-03-17 ENCOUNTER — Inpatient Hospital Stay: Attending: Hematology and Oncology

## 2025-03-17 ENCOUNTER — Inpatient Hospital Stay: Admitting: Hematology and Oncology
